# Patient Record
Sex: Female | Born: 1935 | Race: White | Hispanic: No | Smoking: Never smoker
Health system: Southern US, Community
[De-identification: ages and names within clinical notes are randomized; demographics above are authoritative.]

## PROBLEM LIST (undated history)

## (undated) DIAGNOSIS — K219 Gastro-esophageal reflux disease without esophagitis: Secondary | ICD-10-CM

## (undated) DIAGNOSIS — M1990 Unspecified osteoarthritis, unspecified site: Secondary | ICD-10-CM

## (undated) DIAGNOSIS — I4892 Unspecified atrial flutter: Secondary | ICD-10-CM

## (undated) DIAGNOSIS — I2510 Atherosclerotic heart disease of native coronary artery without angina pectoris: Secondary | ICD-10-CM

## (undated) DIAGNOSIS — I739 Peripheral vascular disease, unspecified: Secondary | ICD-10-CM

## (undated) DIAGNOSIS — S82892A Other fracture of left lower leg, initial encounter for closed fracture: Secondary | ICD-10-CM

## (undated) DIAGNOSIS — I499 Cardiac arrhythmia, unspecified: Secondary | ICD-10-CM

## (undated) DIAGNOSIS — I10 Essential (primary) hypertension: Secondary | ICD-10-CM

## (undated) DIAGNOSIS — C801 Malignant (primary) neoplasm, unspecified: Secondary | ICD-10-CM

## (undated) HISTORY — DX: Peripheral vascular disease, unspecified: I73.9

## (undated) HISTORY — PX: OTHER SURGICAL HISTORY: SHX169

## (undated) HISTORY — PX: PR VEIN BYPASS GRAFT,AORTO-FEM-POP: 35551

## (undated) HISTORY — PX: ABDOMINAL HYSTERECTOMY: SHX81

## (undated) HISTORY — DX: Essential (primary) hypertension: I10

## (undated) HISTORY — DX: Unspecified atrial flutter: I48.92

## (undated) HISTORY — DX: Atherosclerotic heart disease of native coronary artery without angina pectoris: I25.10

---

## 1998-03-06 ENCOUNTER — Inpatient Hospital Stay (HOSPITAL_COMMUNITY): Admission: EM | Admit: 1998-03-06 | Discharge: 1998-03-07 | Payer: Self-pay | Admitting: Emergency Medicine

## 1998-03-06 ENCOUNTER — Encounter: Payer: Self-pay | Admitting: Emergency Medicine

## 1998-12-01 ENCOUNTER — Ambulatory Visit (HOSPITAL_COMMUNITY): Admission: RE | Admit: 1998-12-01 | Discharge: 1998-12-01 | Payer: Self-pay | Admitting: Gastroenterology

## 1999-10-24 ENCOUNTER — Encounter: Admission: RE | Admit: 1999-10-24 | Discharge: 1999-10-24 | Payer: Self-pay | Admitting: Internal Medicine

## 1999-10-24 ENCOUNTER — Encounter: Payer: Self-pay | Admitting: Internal Medicine

## 2000-05-13 ENCOUNTER — Encounter: Payer: Self-pay | Admitting: Emergency Medicine

## 2000-05-13 ENCOUNTER — Emergency Department (HOSPITAL_COMMUNITY): Admission: EM | Admit: 2000-05-13 | Discharge: 2000-05-13 | Payer: Self-pay | Admitting: Emergency Medicine

## 2000-10-01 ENCOUNTER — Ambulatory Visit (HOSPITAL_BASED_OUTPATIENT_CLINIC_OR_DEPARTMENT_OTHER): Admission: RE | Admit: 2000-10-01 | Discharge: 2000-10-01 | Payer: Self-pay

## 2000-10-01 ENCOUNTER — Encounter (INDEPENDENT_AMBULATORY_CARE_PROVIDER_SITE_OTHER): Payer: Self-pay | Admitting: Specialist

## 2000-10-25 ENCOUNTER — Encounter: Payer: Self-pay | Admitting: Internal Medicine

## 2000-10-25 ENCOUNTER — Encounter: Admission: RE | Admit: 2000-10-25 | Discharge: 2000-10-25 | Payer: Self-pay | Admitting: Internal Medicine

## 2001-10-27 ENCOUNTER — Encounter: Payer: Self-pay | Admitting: Internal Medicine

## 2001-10-27 ENCOUNTER — Encounter: Admission: RE | Admit: 2001-10-27 | Discharge: 2001-10-27 | Payer: Self-pay | Admitting: Internal Medicine

## 2002-01-15 ENCOUNTER — Ambulatory Visit (HOSPITAL_COMMUNITY): Admission: RE | Admit: 2002-01-15 | Discharge: 2002-01-15 | Payer: Self-pay | Admitting: Gastroenterology

## 2002-10-30 ENCOUNTER — Encounter: Admission: RE | Admit: 2002-10-30 | Discharge: 2002-10-30 | Payer: Self-pay | Admitting: Internal Medicine

## 2002-10-30 ENCOUNTER — Encounter: Payer: Self-pay | Admitting: Internal Medicine

## 2002-11-09 ENCOUNTER — Other Ambulatory Visit: Admission: RE | Admit: 2002-11-09 | Discharge: 2002-11-09 | Payer: Self-pay | Admitting: Obstetrics and Gynecology

## 2003-11-01 ENCOUNTER — Encounter: Admission: RE | Admit: 2003-11-01 | Discharge: 2003-11-01 | Payer: Self-pay | Admitting: Internal Medicine

## 2004-07-20 ENCOUNTER — Encounter: Admission: RE | Admit: 2004-07-20 | Discharge: 2004-07-20 | Payer: Self-pay | Admitting: Internal Medicine

## 2004-08-31 ENCOUNTER — Encounter: Admission: RE | Admit: 2004-08-31 | Discharge: 2004-08-31 | Payer: Self-pay | Admitting: Internal Medicine

## 2004-09-05 ENCOUNTER — Ambulatory Visit: Payer: Self-pay | Admitting: Pulmonary Disease

## 2004-09-12 ENCOUNTER — Encounter: Admission: RE | Admit: 2004-09-12 | Discharge: 2004-09-12 | Payer: Self-pay | Admitting: Otolaryngology

## 2004-09-15 ENCOUNTER — Encounter: Admission: RE | Admit: 2004-09-15 | Discharge: 2004-09-15 | Payer: Self-pay | Admitting: Otolaryngology

## 2004-11-21 ENCOUNTER — Ambulatory Visit (HOSPITAL_COMMUNITY): Admission: RE | Admit: 2004-11-21 | Discharge: 2004-11-21 | Payer: Self-pay | Admitting: Internal Medicine

## 2005-02-28 ENCOUNTER — Encounter: Admission: RE | Admit: 2005-02-28 | Discharge: 2005-02-28 | Payer: Self-pay | Admitting: Internal Medicine

## 2005-10-24 ENCOUNTER — Encounter: Payer: Self-pay | Admitting: Cardiology

## 2007-10-28 ENCOUNTER — Ambulatory Visit: Payer: Self-pay | Admitting: Vascular Surgery

## 2007-11-06 ENCOUNTER — Ambulatory Visit (HOSPITAL_COMMUNITY): Admission: RE | Admit: 2007-11-06 | Discharge: 2007-11-07 | Payer: Self-pay | Admitting: Surgery

## 2007-11-06 ENCOUNTER — Ambulatory Visit: Payer: Self-pay | Admitting: Surgery

## 2007-11-19 ENCOUNTER — Inpatient Hospital Stay (HOSPITAL_COMMUNITY): Admission: RE | Admit: 2007-11-19 | Discharge: 2007-11-20 | Payer: Self-pay | Admitting: Vascular Surgery

## 2007-11-19 ENCOUNTER — Encounter: Payer: Self-pay | Admitting: Vascular Surgery

## 2007-11-24 ENCOUNTER — Ambulatory Visit: Payer: Self-pay | Admitting: Vascular Surgery

## 2007-12-15 ENCOUNTER — Ambulatory Visit: Payer: Self-pay | Admitting: Vascular Surgery

## 2008-01-26 ENCOUNTER — Ambulatory Visit: Payer: Self-pay | Admitting: Vascular Surgery

## 2008-05-04 ENCOUNTER — Ambulatory Visit: Payer: Self-pay | Admitting: Vascular Surgery

## 2008-11-23 ENCOUNTER — Ambulatory Visit: Payer: Self-pay | Admitting: Vascular Surgery

## 2009-06-21 ENCOUNTER — Ambulatory Visit: Payer: Self-pay | Admitting: Vascular Surgery

## 2009-11-29 ENCOUNTER — Ambulatory Visit: Payer: Self-pay | Admitting: Internal Medicine

## 2009-11-29 ENCOUNTER — Inpatient Hospital Stay (HOSPITAL_COMMUNITY): Admission: EM | Admit: 2009-11-29 | Discharge: 2009-12-06 | Payer: Self-pay | Admitting: Emergency Medicine

## 2009-11-29 ENCOUNTER — Ambulatory Visit: Payer: Self-pay | Admitting: Vascular Surgery

## 2009-11-30 HISTORY — PX: CAROTID STENT: SHX1301

## 2009-12-02 ENCOUNTER — Ambulatory Visit: Payer: Self-pay | Admitting: Hematology & Oncology

## 2009-12-09 ENCOUNTER — Observation Stay (HOSPITAL_COMMUNITY): Admission: EM | Admit: 2009-12-09 | Discharge: 2009-12-10 | Payer: Self-pay | Admitting: Emergency Medicine

## 2009-12-14 ENCOUNTER — Encounter: Admission: RE | Admit: 2009-12-14 | Discharge: 2009-12-14 | Payer: Self-pay | Admitting: Internal Medicine

## 2009-12-20 ENCOUNTER — Ambulatory Visit: Payer: Self-pay | Admitting: Vascular Surgery

## 2010-01-30 ENCOUNTER — Ambulatory Visit: Payer: Self-pay | Admitting: Cardiology

## 2010-02-01 ENCOUNTER — Encounter: Admission: RE | Admit: 2010-02-01 | Discharge: 2010-02-01 | Payer: Self-pay | Admitting: Cardiology

## 2010-03-28 ENCOUNTER — Ambulatory Visit: Payer: Self-pay | Admitting: Cardiology

## 2010-04-04 ENCOUNTER — Ambulatory Visit: Payer: Self-pay | Admitting: Cardiology

## 2010-04-10 ENCOUNTER — Ambulatory Visit: Payer: Self-pay | Admitting: Cardiology

## 2010-04-12 ENCOUNTER — Encounter: Admission: RE | Admit: 2010-04-12 | Discharge: 2010-04-12 | Payer: Self-pay | Admitting: Cardiology

## 2010-04-25 ENCOUNTER — Ambulatory Visit: Payer: Self-pay | Admitting: Vascular Surgery

## 2010-06-25 ENCOUNTER — Inpatient Hospital Stay (HOSPITAL_COMMUNITY)
Admission: EM | Admit: 2010-06-25 | Discharge: 2010-06-28 | Payer: Self-pay | Source: Home / Self Care | Attending: Cardiology | Admitting: Cardiology

## 2010-06-27 HISTORY — PX: GLIAYTE CATHERTER INSERTION: SHX5372

## 2010-07-05 ENCOUNTER — Encounter (INDEPENDENT_AMBULATORY_CARE_PROVIDER_SITE_OTHER): Payer: Self-pay | Admitting: *Deleted

## 2010-07-16 ENCOUNTER — Encounter: Payer: Self-pay | Admitting: Internal Medicine

## 2010-07-17 ENCOUNTER — Telehealth: Payer: Self-pay | Admitting: Internal Medicine

## 2010-07-21 ENCOUNTER — Telehealth: Payer: Self-pay | Admitting: Internal Medicine

## 2010-07-27 NOTE — Progress Notes (Signed)
Summary: pt having some chest discomfort  Phone Note Call from Patient Call back at Home Phone [phone]   Caller: Patient Reason for Call: Talk to Nurse, Talk to Doctor Summary of Call: pt has been put on diltiazem 180mg  once daily by Dr. Chilton Si and she is having a little chest discomfort and she wants to discuss this with someone and make sure it is ok Initial call taken by: Omer Jack,  July 17, 2010 3:49 PM  Follow-up for Phone Call        Since discharge, patient saw her PCP, and he started her on Diltiazem 180mg  daily for her BP. Her BP is still remaining elevated around 160 systolic. She doesn't remember the diastolic. It varies with her BP cuff. Sometimes the systolic is 130-140s.  Since her PCP is following this, advised her to contact them with her BP readings. 01/19 140/70, 01/20142/70, 1/21 144/70, 170/50 01/22, 160/48 01/23. She has not been taking her HR.  No c/o palpitations.  She has been having chest discomfort with ambulation. She has had CP on & off mainly when she takes a deep breath. This started happening 2-3 days after discharge from the hospital. Advised her to try her Nitroglycerine SL tablets. Dr. Deborah Chalk is her primary cardiologist so I contactd their office and spoke with Tresa Endo, his RN. She will call Mrs. Brossart to bring her in their office. Whitney Maeola Sarah RN  July 17, 2010 4:24 PM  Follow-up by: Whitney Maeola Sarah RN,  July 17, 2010 4:24 PM

## 2010-07-27 NOTE — Letter (Signed)
Summary: Appointment - Reminder 2  [facility], Main Office  [address]   [place], [place] [address]   Phone: [phone]  Fax: [phone]     July 05, 2010 MRN: [id number]   [facility] [address] [place], [place]  [address]   Dear Ms. Fehringer,  Our records indicate that it is time to schedule a follow-up appointment. Dr.Taylor recommended that you follow up with [place] in 6-8 weeks from hospital discharge           . It is very important that we reach you to schedule this appointment. We look forward to participating in your health care needs. Please contact us at the number listed above at your earliest convenience to schedule your appointment.  If you are unable to make an appointment at this time, give [place] a call so we can update our records.     Sincerely,   Glass blower/designer

## 2010-08-02 NOTE — Progress Notes (Signed)
Summary: pt needs to know about antibiotics  Phone Note Call from Patient Call back at Home Phone [phone]   Caller: Patient Reason for Call: Talk to Nurse, Talk to Doctor Summary of Call: pt needs to know if she needs pre meds prior to dental appt next week Initial call taken by: Omer Jack,  July 21, 2010 11:46 AM  Follow-up for Phone Call        Pt needs to have Amoxicillin 2.0g one hour prior to procedure per Dr [name].  he gives antibiotics 3 month s/p ablation lmom for pt to call Mon and let me know where to call in medication Dennis Bast, RN, BSN  July 21, 2010 5:51 PM Called pt back and spoke with her she can not take PCN  I have updated her allergies  She is taking Clindamycin 150mg  three times a day #30 states this is giving her diaherra Dennis Bast, RN, BSN  July 24, 2010 5:00 PM   New Allergies: ! PCN New Allergies: ! PCN

## 2010-08-03 NOTE — Consult Note (Signed)
NAMEJENNAYA, Michelle Gaines             ACCOUNT NO.:  [id number]  MEDICAL RECORD NO.:  [id number]          PATIENT TYPE:  INP  LOCATION:  2039                         FACILITY:  [facility]  PHYSICIAN:  Doylene Canning. [name], MD    DATE OF BIRTH:  12-21-1928  DATE OF CONSULTATION:  06/27/2010 DATE OF DISCHARGE:                                CONSULTATION   REQUESTING PHYSICIAN:  Jonelle Sidle, MD  INDICATION FOR CONSULTATION:  Evaluation of atrial flutter.  HISTORY OF PRESENT ILLNESS:  The patient is a very pleasant 77 year old woman with coronary artery disease and peripheral artery disease status post stenting of the right coronary artery in 2011, (June) by Dr. Deborah Chalk and status post peripheral bypass grafting of the right femoral artery by Dr. Hart Rochester in the past.  The patient was in her usual state of health when she presented with palpitations, chest pain, and shortness of breath and was subsequently found to be in atrial flutter with a rapid ventricular response.  She is admitted to the hospital for additional evaluation.  The patient denies palpitations.  She notes that over the last few days, she has had increasing shortness of breath and chest discomfort.  In the emergency room, she was found to be in atrial flutter with a rapid ventricular response at around 120 beats per minute and is admitted for additional evaluation.  PAST MEDICAL HISTORY:  As noted in the HPI.  She also has longstanding hypertension.  There may be a bit of dementia, though it is not clearly documented.  SOCIAL HISTORY:  The patient lives in [place] with her husband.  She is retired.  She denies tobacco abuse.  FAMILY HISTORY:  Negative for premature coronary artery disease.  REVIEW OF SYSTEMS:  As noted in the HPI.  She has very minimal arthritic complaints.  She has very minimal claudication.  PHYSICAL EXAMINATION:  GENERAL:  She is a pleasant, elderly-appearing woman in no acute distress.  She is  very thin. VITAL SIGNS:  Blood pressure was 140/90, the pulse was 90 and irregular, respirations were 18, temperature was 98. HEENT:  Normocephalic and atraumatic.  Pupils equal and round. Oropharynx is moist.  Sclerae are anicteric. NECK:  No jugular venous distention.  There is no thyromegaly.  Trachea is midline.  Carotids are 2+ and symmetric. LUNGS:  Clear bilaterally to auscultation.  No wheezes, rales, or rhonchi are present.  There is no increased work of breathing. CARDIOVASCULAR:  An irregular rhythm with normal S1 and S2.  The PMI was not enlarged nor laterally displaced. ABDOMINAL:  Soft, nontender, nondistended.  There is no organomegaly. The bowel sounds are present.  No rebound or guarding. EXTREMITIES:  A well-healed right inguinal scar with an obvious graft in place.  The pulses were 2+ and symmetric.  There was no peripheral edema. NEUROLOGIC:  Alert and oriented x3 with the cranial nerves intact. Strength is 5/5, symmetric.  EKG demonstrates atrial flutter with a variable ventricular response.  LABORATORY DATA:  Unremarkable.  TEE was carried out today, demonstrating small chambers and no left atrial appendage thrombus.  IMPRESSION: 1. Ischemic heart disease status post stenting  of the right coronary     artery on aspirin and Plavix. 2. Hypertension. 3. New-onset atrial flutter.  DISCUSSION:  I have discussed the treatment options with the patient and her husband.  The patient is not a good long-term anticoagulation candidate with the Coumadin, in fact, she refuses medicine.  I would recommend proceeding with EP study and catheter ablation of her flutter as the TEE demonstrated no left atrial appendage thrombus.  The risks, benefits, goals, and expectations of the procedure have been discussed and she wishes to proceed.  This will be scheduled at the earliest possible convenient time.     Doylene Canning. [name], MD     GWT/MEDQ  D:  06/27/2010  T:  06/28/2010   Job:  [id number]  cc:   Colleen Can. Deborah Chalk, M.D.  Electronically Signed by Lewayne Bunting MD on 08/03/2010 05:08:27 PM

## 2010-08-03 NOTE — Discharge Summary (Signed)
NAMEFARIHA, Michelle Gaines             ACCOUNT NO.:  [id number]  MEDICAL RECORD NO.:  [id number]          PATIENT TYPE:  INP  LOCATION:  2039                         FACILITY:  [facility]  PHYSICIAN:  Doylene Canning. [name], MD    DATE OF BIRTH:  12-May-1929  DATE OF ADMISSION:  06/25/2010 DATE OF DISCHARGE:  06/28/2010                              DISCHARGE SUMMARY   PRIMARY CARE PHYSICIAN:  Lenon Curt. Chilton Si, MD  PRIMARY CARDIOLOGIST:  Colleen Can. Deborah Chalk, MD  ELECTROPHYSIOLOGIST:  Doylene Canning. [name], MD  PRIMARY DIAGNOSIS:  Atrial flutter with rapid ventricular response.  SECONDARY DIAGNOSES: 1. Coronary artery disease status post myocardial infarction with     stenting of the right coronary artery with a drug-eluting stent in     June of this year. 2. Peripheral vascular disease with previous right external iliac to     superficial femoral artery bypass grafting by Dr. Hart Rochester in 2009. 3. Hypertension. 4. History of ovarian cancer. 5. History of hysterectomy.  ALLERGIES:  The patient is allergic to CODEINE, PENICILLIN, CEPHALOSPORIN, DOXYCYCLINE, CLORAZEPATE, ZOLPIDEM, FEXOFENADINE, HYDRALAZINE, NORVASC, and COMTREX.  PROCEDURES THIS ADMISSION: 1. Chest x-ray on June 25, 2010, demonstrated no acute disease. 2. CT angiogram of the chest on June 25, 2010, demonstrating no     pulmonary embolism. 3. TEE on June 27, 2010, by Dr. Eden Emms, which demonstrated an     ejection fraction of 50% with septal hypokinesis, mild mitral     regurgitation.  The patient had no left atrial appendage thrombus     or spontaneous contrast. 4. Electrophysiology study and radiofrequency catheter ablation of     atrial flutter on June 27, 2010, by Dr. [name].  This study     demonstrated successful ablation of typical atrial flutter across     the usual atrial flutter isthmus resulting in termination of     flutter, restoration of sinus rhythm, and creation of bidirectional     block.  The patient had no  early apparent complications.  BRIEF HISTORY OF PRESENT ILLNESS:  Ms. Emmerich is a 77 year old female with a history of coronary artery disease, peripheral vascular disease, hypertension.  On the day before admission, she had a sudden onset of pleuritic chest pain and palpitations.  She has had these symptoms on and off since November 29, 2009, although they have never persisted like the episode preceding admission.  Because of her symptoms, she presented to [facility] for evaluation.  HOSPITAL COURSE:  The patient presented to [facility] for evaluation of palpitations and chest pain.  She was found to be in atrial flutter with rapid ventricular response.  She was began on heparin and diltiazem drip for rate control.  She was evaluated by Dr. [name] with Electrophysiology for treatment options of atrial flutter. Dr. [name] recommended TEE and catheter ablation of atrial flutter. Risks, benefits, and alternatives to this procedure were discussed with the patient.  She wished to proceed.  The patient underwent TEE and radiofrequency catheter ablation of atrial flutter on June 27, 2010. She was monitored on telemetry overnight, which demonstrated sinus rhythm.  She was examined by Dr.  [name] on June 28, 2010.  Her groin incision was without hematoma.  Telemetry demonstrated sinus rhythm with no ectopy.  EKG demonstrated sinus rhythm with a ventricular rate of 73 beats per minute and intervals of 0.17/0.08/0.44.  The patient was considered stable for discharge.  Dr. [name] did discuss anticoagulation with the patient.  However, she refuses Coumadin at this time.  FOLLOWUP APPOINTMENTS: 1. Dr. [name] in 6 weeks - our office will call to schedule this     appointment. 2. Dr. Deborah Chalk as scheduled. 3. Dr. Chilton Si as scheduled.  DISCHARGE INSTRUCTIONS: 1. Increase activity slowly. 2. No driving for 2 days. 3. Follow low-sodium heart-healthy diet. 4. Keep incisions clean  and dry.  DISCHARGE MEDICATIONS: 1. Tylenol 325 mg every 4 hours as needed. 2. Aspirin 325 mg daily. 3. Coreg 6.25 mg 1 tablet twice daily. 4. Nitroglycerin as needed for chest pain. 5. Plavix 75 mg daily. 6. Lorazepam 1 mg 1/2 to 1 tablet daily at bedtime as needed. 7. MiraLax daily as needed. 8. Multivitamin daily. 9. Valturna 300/320 mg 1 tablet twice daily.  Of note, the patient's metoprolol and aspirin 81 mg were changed this admission to a full-strength aspirin and carvedilol per Dr [name].  DISPOSITION:  The patient was seen and examined by Dr. [name] on June 28, 2009, considered stable for discharge.  DURATION OF DISCHARGE ENCOUNTER:  35 minutes.     Gypsy Balsam, RN,BSN   ______________________________ Doylene Canning. [name], MD    AS/MEDQ  D:  06/28/2010  T:  06/28/2010  Job:  [id number]  cc:   Lenon Curt. Chilton Si, M.D. Colleen Can. Deborah Chalk, M.D.  Electronically Signed by Gypsy Balsam RNBSN on 07/02/2010 05:54:20 PM Electronically Signed by Lewayne Bunting MD on 08/03/2010 05:08:32 PM

## 2010-08-03 NOTE — Op Note (Signed)
NAMEKAILANI, BRASS             ACCOUNT NO.:  [id number]  MEDICAL RECORD NO.:  [id number]          PATIENT TYPE:  INP  LOCATION:  2039                         FACILITY:  [facility]  PHYSICIAN:  Doylene Canning. [name], MD    DATE OF BIRTH:  04/26/1929  DATE OF PROCEDURE:  06/27/2010 DATE OF DISCHARGE:                              OPERATIVE REPORT   PROCEDURE PERFORMED:  Electrophysiologic study and RF catheter ablation of atrial flutter.  INTRODUCTION:  The patient is a very pleasant 77 year old patient of Dr. Deborah Chalk.  She has ischemic heart disease status post stenting.  She has peripheral vascular disease and she presented to the hospital with atrial flutter and rapid ventricular response.  She is now referred for electrophysiologic study and catheter ablation after transesophageal echo demonstrated no left atrial appendage or thrombus.  PROCEDURE:  After informed consent was obtained, the patient was taken to the diagnostic EP lab in a fasting state.  After usual preparation and draping, intravenous fentanyl and midazolam was given for sedation. A 6-French hexapolar catheter was inserted percutaneously into the right jugular vein and advanced to the coronary sinus.  A 5-French quadripolar catheter was inserted percutaneously in the right femoral vein and advanced to the His bundle region.  A 7-French quadripolar ablation catheter was inserted percutaneously and the right femoral vein advanced the right atrium.  Mapping was carried out demonstrating typical counterclockwise tricuspid annular reentrant atrial flutter at a cycle length of 270 milliseconds.  This was confirmed by entrainment mapping, which demonstrated a post pacing interval of less than 30 milliseconds of the tachycardia cycle length.  With all the above diagnosis of typical atrial flutter was made and the ablation catheter was advanced into the region of the tricuspid valve annulus.  A total of 8 RF energy applications  were subsequently delivered.  During atrial flutter, the tachycardia was terminated and sinus rhythm was restored.  Pacing from the coronary sinus demonstrated a stimulation to atrial signal of greater than 150 milliseconds off the ablation catheter.  The patient was observed for approximately 15 minutes and during this time, rapid ventricular pacing was carried out from the right ventricle, this demonstrated VA dissociation at 600 milliseconds.  Programmed ventricular stimulation was carried out demonstrating VA dissociation at 600 milliseconds.  Rapid atrial pacing was carried out demonstrating AV Wenckebach cycle length of 430 milliseconds.  Programmed atrial stimulation was carried out from the coronary sinus in the right atrium demonstrated at a base drive cycle length of 130 milliseconds and the S1 and S2 interval stepwise decreased down to 340 milliseconds where AV node ERP was observed.  During programmed atrial stimulation, there were no inducible arrhythmias.  At this point, the catheters were removed, hemostasis was assured, and the patient was returned to her room in satisfactory condition.  COMPLICATIONS:  There were no immediate procedural complications.  RESULTS:  a.  Baseline ECG.  Baseline ECG demonstrates atrial flutter with rapid ventricular response. b.  Baseline intervals.  The HV interval was 58 milliseconds.  The QRS duration 95 milliseconds.  Sinus node cycle length following ablation was 1200 milliseconds. c.  Rapid ventricular pacing.  Following ablation, rapid ventricular pacing demonstrated VA dissociation at 600 milliseconds. d.  Programmed ventricular stimulation.  Following ablation, programmed ventricular stimulation was demonstrated VA dissociation at 600 milliseconds. e.  Rapid atrial pacing.  Following ablation, rapid atrial pacing demonstrated an AV Wenckebach cycle length of 430 milliseconds.  During rapid atrial pacing, the PR interval was less  than the RR interval and there was no inducible SVT. f.  Programmed atrial stimulation.  Programmed atrial stimulation following ablation was carried out at a base drive cycle length of 960 milliseconds.  The S1 and S2 interval was stepwise decreased down to 340 milliseconds where the AV node ERP was observed.  During programmed atrial stimulation, there were no AH jumps, no echo beats, no inducible SVT. g.  Arrhythmias observed. 1. Atrial flutter initiation present at the time of EP study.     Duration was sustained.  Termination was with catheter ablation.     Cycle length was 270 milliseconds. 2. Atrial fibrillation initiation spontaneous.  Following termination     of flutter, the duration was sustained, termination was     spontaneous.     a.     Mapping.  Mapping of the atrial flutter isthmus demonstrated      a somewhat smaller than usual caudally displaced atrial flutter      isthmus.     b.     RF energy application.  A total of 8 RF energy applications      were delivered to the usual atrial flutter isthmus resulting in      termination of flutter, restoration of sinus rhythm, creation of      bidirectional block in atrial flutter isthmus.  CONCLUSION:  Study demonstrates successful with electrophysiologic and RF catheter ablation of typical atrial flutter with a total of 8 RF energy applications delivered, the usual atrial flutter isthmus resulting in termination of flutter, restoration of sinus rhythm, creation of bidirectional block in atrial flutter isthmus.     Doylene Canning. [name], MD     GWT/MEDQ  D:  06/27/2010  T:  06/28/2010  Job:  [id number]  cc:   Colleen Can. Deborah Chalk, M.D.  Electronically Signed by Lewayne Bunting MD on 08/03/2010 11:91:47 PM

## 2010-08-16 DIAGNOSIS — I2510 Atherosclerotic heart disease of native coronary artery without angina pectoris: Secondary | ICD-10-CM | POA: Insufficient documentation

## 2010-08-16 DIAGNOSIS — I10 Essential (primary) hypertension: Secondary | ICD-10-CM | POA: Insufficient documentation

## 2010-08-16 DIAGNOSIS — I739 Peripheral vascular disease, unspecified: Secondary | ICD-10-CM | POA: Insufficient documentation

## 2010-08-17 ENCOUNTER — Encounter (INDEPENDENT_AMBULATORY_CARE_PROVIDER_SITE_OTHER): Payer: MEDICARE | Admitting: Internal Medicine

## 2010-08-17 ENCOUNTER — Encounter: Payer: Self-pay | Admitting: Internal Medicine

## 2010-08-17 DIAGNOSIS — I4892 Unspecified atrial flutter: Secondary | ICD-10-CM | POA: Insufficient documentation

## 2010-08-17 DIAGNOSIS — I253 Aneurysm of heart: Secondary | ICD-10-CM

## 2010-08-22 NOTE — Assessment & Plan Note (Signed)
Summary: eph/per pt call.mj   Visit Type:  Initial Consult   History of Present Illness: Michelle Gaines returns today for followup. She is a pleasant 77 yo woman with  CAD, HTN, atrial flutter s/p EPS/RFA of atrial flutter several weeks ago. She has rare palpitations but denies c/p. The patient is convinced that she is losing her vision when she takes her ASA.  She is now only taking plavix having stopped the ASA several months ago.  The patient is convinced that her multiple herbal preparations are helping her. No syncope or near syncope.  Current Medications (verified): 1)  Aspirin Ec 325 Mg Tbec (Aspirin) .... Take One Tablet By Mouth Daily 2)  Carvedilol 6.25 Mg Tabs (Carvedilol) .... Take One Tablet By Mouth Twice A Day 3)  Nitrostat 0.4 Mg Subl (Nitroglycerin) .Marland Kitchen.. 1 Tablet Under Tongue At Onset of Chest Pain; You May Repeat Every 5 Minutes For Up To 3 Doses. 4)  Plavix 75 Mg Tabs (Clopidogrel Bisulfate) .... Take One Tablet By Mouth Daily 5)  Lorazepam 1 Mg Tabs (Lorazepam) .... Uad 6)  Tylenol 325 Mg Tabs (Acetaminophen) .... As Needed 7)  Miralax  Powd (Polyethylene Glycol 3350) .... As Needed 8)  Valturna 300-320 Mg Tabs (Aliskiren-Valsartan) .... Two Times A Day 9)  Multivitamins   Tabs (Multiple Vitamin) .... Once Daily  Allergies: 1)  ! Pcn 2)  ! Codeine 3)  ! Cephalexin 4)  ! Doxycycline 5)  ! * Clorazepate 6)  ! * Zolpidem 7)  ! * Fexofenodine 8)  ! * Hydralazine 9)  ! * Phenylephrine 10)  ! * Amlodipine  Past History:  Past Medical History: Last updated: 08/16/2010 Current Problems:  HYPERTENSION (ICD-401.9) PVD (ICD-443.9) CAD (ICD-414.00)    Family History: No premature CAD  Social History: Married times 60 years.  Denies tobacco or ETOH.  Vital Signs:  Patient profile:   76 year old female Height:      64 inches Weight:      91 pounds BMI:     15.68 Pulse rate:   51 / minute BP sitting:   170 / 82  (left arm)  Vitals Entered By: Laurance Flatten  CMA (August 17, 2010 8:54 AM)  Physical Exam  General:  Elderly, well developed, well nourished, in no acute distress.  HEENT: normal Neck: supple. No JVD. Carotids 2+ bilaterally no bruits Cor: RRR no rubs, gallops or murmur Lungs: CTA Ab: soft, nontender. nondistended. No HSM. Good bowel sounds Ext: warm. no cyanosis, clubbing or edema Neuro: alert and oriented. Grossly nonfocal. affect pleasant    EKG  Procedure date:  08/17/2010  Findings:      Sinus bradycardia with rate of:51.   Left ventricular hypertrophy.    Impression & Recommendations:  Problem # 1:  CAD (ICD-414.00) She denies anginal symptoms. Continue current meds. Her updated medication list for this problem includes:        Carvedilol 6.25 Mg Tabs (Carvedilol) .Marland Kitchen... Take one tablet by mouth twice a day    Nitrostat 0.4 Mg Subl (Nitroglycerin) .Marland Kitchen... 1 tablet under tongue at onset of chest pain; you may repeat every 5 minutes for up to 3 doses.    Plavix 75 Mg Tabs (Clopidogrel bisulfate) .Marland Kitchen... Take one tablet by mouth daily  Problem # 2:  ATRIAL FLUTTER (ICD-427.32) Her symptoms have resolved after catheter ablation. Willl follow. She is not on ASA.  Patient Instructions: 1)  Your physician wants you to follow-up in: 6 months with Dr [name]  You will receive a reminder letter in the mail two months in advance. If you don't receive a letter, please call our office to schedule the follow-up appointment. 2)  Your physician recommends that you continue on your current medications as directed. Please refer to the Current Medication list given to you today. 3)  If Aspirin is affecting your vision please stop

## 2010-09-04 LAB — CBC
HCT: 38.7 % (ref 36.0–46.0)
HCT: 43 % (ref 36.0–46.0)
Hemoglobin: 12 g/dL (ref 12.0–15.0)
Hemoglobin: 12.8 g/dL (ref 12.0–15.0)
MCH: 32.2 pg (ref 26.0–34.0)
MCHC: 33.1 g/dL (ref 30.0–36.0)
MCHC: 33.5 g/dL (ref 30.0–36.0)
MCV: 97.2 fL (ref 78.0–100.0)
MCV: 97.5 fL (ref 78.0–100.0)
MCV: 97.9 fL (ref 78.0–100.0)
Platelets: 135 10*3/uL — ABNORMAL LOW (ref 150–400)
Platelets: 153 10*3/uL (ref 150–400)
Platelets: 182 10*3/uL (ref 150–400)
RBC: 3.79 MIL/uL — ABNORMAL LOW (ref 3.87–5.11)
RBC: 4.39 MIL/uL (ref 3.87–5.11)
RDW: 13 % (ref 11.5–15.5)
WBC: 10.4 10*3/uL (ref 4.0–10.5)
WBC: 5.9 10*3/uL (ref 4.0–10.5)
WBC: 6.8 10*3/uL (ref 4.0–10.5)

## 2010-09-04 LAB — LIPID PANEL
Cholesterol: 197 mg/dL (ref 0–200)
LDL Cholesterol: 108 mg/dL — ABNORMAL HIGH (ref 0–99)
Total CHOL/HDL Ratio: 2.4 RATIO

## 2010-09-04 LAB — CARDIAC PANEL(CRET KIN+CKTOT+MB+TROPI)
CK, MB: 0.9 ng/mL (ref 0.3–4.0)
CK, MB: 1.8 ng/mL (ref 0.3–4.0)
Relative Index: INVALID (ref 0.0–2.5)
Total CK: 41 U/L (ref 7–177)

## 2010-09-04 LAB — DIFFERENTIAL
Basophils Absolute: 0 10*3/uL (ref 0.0–0.1)
Basophils Relative: 0 % (ref 0–1)
Eosinophils Absolute: 0 10*3/uL (ref 0.0–0.7)
Eosinophils Relative: 0 % (ref 0–5)
Lymphocytes Relative: 8 % — ABNORMAL LOW (ref 12–46)

## 2010-09-04 LAB — POCT CARDIAC MARKERS
Myoglobin, poc: 104 ng/mL (ref 12–200)
Myoglobin, poc: 55.3 ng/mL (ref 12–200)
Myoglobin, poc: 82.4 ng/mL (ref 12–200)
Troponin i, poc: <0.05 ng/mL (ref 0.00–0.09)

## 2010-09-04 LAB — BASIC METABOLIC PANEL
BUN: 16 mg/dL (ref 6–23)
Chloride: 103 mEq/L (ref 96–112)
Chloride: 105 mEq/L (ref 96–112)
Creatinine, Ser: 0.86 mg/dL (ref 0.4–1.2)
GFR calc Af Amer: >60 mL/min (ref >60)
GFR calc non Af Amer: 60 mL/min — ABNORMAL LOW (ref >60)
Potassium: 3.9 mEq/L (ref 3.5–5.1)

## 2010-09-04 LAB — PROTIME-INR
INR: 1.11 (ref 0.00–1.49)
Prothrombin Time: 14.5 seconds (ref 11.6–15.2)

## 2010-09-04 LAB — CK TOTAL AND CKMB (NOT AT ARMC)
Relative Index: INVALID (ref 0.0–2.5)
Total CK: 46 U/L (ref 7–177)
Total CK: 60 U/L (ref 7–177)

## 2010-09-04 LAB — HEPARIN LEVEL (UNFRACTIONATED)
Heparin Unfractionated: 0.12 IU/mL — ABNORMAL LOW (ref 0.30–0.70)
Heparin Unfractionated: <0.1 IU/mL — ABNORMAL LOW (ref 0.30–0.70)

## 2010-09-04 LAB — HEMOGLOBIN A1C: Hgb A1c MFr Bld: 5.5 % (ref <5.7)

## 2010-09-10 LAB — HEMOCCULT GUIAC POC 1CARD (OFFICE): Fecal Occult Bld: NEGATIVE

## 2010-09-10 LAB — URINALYSIS, ROUTINE W REFLEX MICROSCOPIC
Bilirubin Urine: NEGATIVE
Glucose, UA: NEGATIVE mg/dL
Ketones, ur: NEGATIVE mg/dL
Leukocytes, UA: NEGATIVE
Nitrite: NEGATIVE
Protein, ur: NEGATIVE mg/dL
Specific Gravity, Urine: 1.014 (ref 1.005–1.030)
Urobilinogen, UA: 0.2 mg/dL (ref 0.0–1.0)
pH: 6 (ref 5.0–8.0)

## 2010-09-10 LAB — BASIC METABOLIC PANEL
BUN: 13 mg/dL (ref 6–23)
BUN: 18 mg/dL (ref 6–23)
CO2: 24 mEq/L (ref 19–32)
CO2: 26 mEq/L (ref 19–32)
Calcium: 8.6 mg/dL (ref 8.4–10.5)
Chloride: 102 mEq/L (ref 96–112)
Chloride: 104 mEq/L (ref 96–112)
Creatinine, Ser: 0.79 mg/dL (ref 0.4–1.2)
GFR calc Af Amer: >60 mL/min (ref >60)
GFR calc non Af Amer: >60 mL/min (ref >60)
Glucose, Bld: 87 mg/dL (ref 70–99)
Glucose, Bld: 95 mg/dL (ref 70–99)
Potassium: 3.4 mEq/L — ABNORMAL LOW (ref 3.5–5.1)
Potassium: 3.6 mEq/L (ref 3.5–5.1)
Sodium: 134 mEq/L — ABNORMAL LOW (ref 135–145)

## 2010-09-10 LAB — CBC
HCT: 32.4 % — ABNORMAL LOW (ref 36.0–46.0)
HCT: 35.7 % — ABNORMAL LOW (ref 36.0–46.0)
MCHC: 34 g/dL (ref 30.0–36.0)
MCV: 89.4 fL (ref 78.0–100.0)
MCV: 89.8 fL (ref 78.0–100.0)
Platelets: 216 10*3/uL (ref 150–400)
Platelets: 242 10*3/uL (ref 150–400)
RBC: 3.61 MIL/uL — ABNORMAL LOW (ref 3.87–5.11)
RDW: 15.9 % — ABNORMAL HIGH (ref 11.5–15.5)
WBC: 8.8 10*3/uL (ref 4.0–10.5)

## 2010-09-10 LAB — COMPREHENSIVE METABOLIC PANEL
ALT: 204 U/L — ABNORMAL HIGH (ref 0–35)
AST: 48 U/L — ABNORMAL HIGH (ref 0–37)
Albumin: 3 g/dL — ABNORMAL LOW (ref 3.5–5.2)
Alkaline Phosphatase: 75 U/L (ref 39–117)
BUN: 21 mg/dL (ref 6–23)
CO2: 24 mEq/L (ref 19–32)
Calcium: 8.6 mg/dL (ref 8.4–10.5)
Chloride: 101 mEq/L (ref 96–112)
Creatinine, Ser: 0.96 mg/dL (ref 0.4–1.2)
GFR calc Af Amer: >60 mL/min (ref >60)
GFR calc non Af Amer: 56 mL/min — ABNORMAL LOW (ref >60)
Glucose, Bld: 113 mg/dL — ABNORMAL HIGH (ref 70–99)
Potassium: 3.7 mEq/L (ref 3.5–5.1)
Sodium: 132 mEq/L — ABNORMAL LOW (ref 135–145)
Total Bilirubin: 2.1 mg/dL — ABNORMAL HIGH (ref 0.3–1.2)
Total Protein: 5.8 g/dL — ABNORMAL LOW (ref 6.0–8.3)

## 2010-09-10 LAB — DIFFERENTIAL
Basophils Absolute: 0 10*3/uL (ref 0.0–0.1)
Basophils Relative: 0 % (ref 0–1)
Eosinophils Absolute: 0.1 10*3/uL (ref 0.0–0.7)
Eosinophils Relative: 1 % (ref 0–5)
Lymphocytes Relative: 6 % — ABNORMAL LOW (ref 12–46)
Lymphs Abs: 0.5 10*3/uL — ABNORMAL LOW (ref 0.7–4.0)
Monocytes Absolute: 0.9 10*3/uL (ref 0.1–1.0)
Monocytes Relative: 11 % (ref 3–12)
Neutro Abs: 7.3 10*3/uL (ref 1.7–7.7)
Neutrophils Relative %: 82 % — ABNORMAL HIGH (ref 43–77)

## 2010-09-10 LAB — MAGNESIUM: Magnesium: 2 mg/dL (ref 1.5–2.5)

## 2010-09-10 LAB — URINE MICROSCOPIC-ADD ON

## 2010-09-11 LAB — PROTIME-INR
INR: 1.03 (ref 0.00–1.49)
INR: 1.26 (ref 0.00–1.49)
INR: 1.79 — ABNORMAL HIGH (ref 0.00–1.49)
INR: 1.85 — ABNORMAL HIGH (ref 0.00–1.49)
INR: 2.15 — ABNORMAL HIGH (ref 0.00–1.49)
Prothrombin Time: 13.4 seconds (ref 11.6–15.2)
Prothrombin Time: 14.7 seconds (ref 11.6–15.2)
Prothrombin Time: 14.7 seconds (ref 11.6–15.2)
Prothrombin Time: 15.7 seconds — ABNORMAL HIGH (ref 11.6–15.2)
Prothrombin Time: 16.1 seconds — ABNORMAL HIGH (ref 11.6–15.2)
Prothrombin Time: 20.6 seconds — ABNORMAL HIGH (ref 11.6–15.2)

## 2010-09-11 LAB — DIFFERENTIAL
Basophils Absolute: 0 10*3/uL (ref 0.0–0.1)
Basophils Absolute: 0 10*3/uL (ref 0.0–0.1)
Basophils Relative: 0 % (ref 0–1)
Eosinophils Absolute: 0.1 10*3/uL (ref 0.0–0.7)
Eosinophils Absolute: 0.1 10*3/uL (ref 0.0–0.7)
Eosinophils Relative: 0 % (ref 0–5)
Eosinophils Relative: 1 % (ref 0–5)
Eosinophils Relative: 2 % (ref 0–5)
Eosinophils Relative: 3 % (ref 0–5)
Lymphocytes Relative: 10 % — ABNORMAL LOW (ref 12–46)
Lymphocytes Relative: 10 % — ABNORMAL LOW (ref 12–46)
Lymphocytes Relative: 4 % — ABNORMAL LOW (ref 12–46)
Lymphocytes Relative: 6 % — ABNORMAL LOW (ref 12–46)
Lymphocytes Relative: 9 % — ABNORMAL LOW (ref 12–46)
Lymphs Abs: 0.6 10*3/uL — ABNORMAL LOW (ref 0.7–4.0)
Lymphs Abs: 0.7 10*3/uL (ref 0.7–4.0)
Lymphs Abs: 0.7 10*3/uL (ref 0.7–4.0)
Lymphs Abs: 0.7 10*3/uL (ref 0.7–4.0)
Monocytes Absolute: 0.4 10*3/uL (ref 0.1–1.0)
Monocytes Absolute: 0.5 10*3/uL (ref 0.1–1.0)
Monocytes Absolute: 0.7 10*3/uL (ref 0.1–1.0)
Monocytes Absolute: 1.3 10*3/uL — ABNORMAL HIGH (ref 0.1–1.0)
Monocytes Relative: 3 % (ref 3–12)
Monocytes Relative: 7 % (ref 3–12)
Monocytes Relative: 8 % (ref 3–12)
Monocytes Relative: 9 % (ref 3–12)
Neutro Abs: 15.8 10*3/uL — ABNORMAL HIGH (ref 1.7–7.7)
Neutro Abs: 5.7 10*3/uL (ref 1.7–7.7)
Neutrophils Relative %: 78 % — ABNORMAL HIGH (ref 43–77)
Neutrophils Relative %: 86 % — ABNORMAL HIGH (ref 43–77)

## 2010-09-11 LAB — CARDIAC PANEL(CRET KIN+CKTOT+MB+TROPI)
CK, MB: 1 ng/mL (ref 0.3–4.0)
Relative Index: INVALID (ref 0.0–2.5)
Relative Index: INVALID (ref 0.0–2.5)
Total CK: 284 U/L — ABNORMAL HIGH (ref 7–177)
Total CK: 48 U/L (ref 7–177)
Total CK: 58 U/L (ref 7–177)
Troponin I: 0.02 ng/mL (ref 0.00–0.06)
Troponin I: 0.02 ng/mL (ref 0.00–0.06)
Troponin I: 0.12 ng/mL — ABNORMAL HIGH (ref 0.00–0.06)

## 2010-09-11 LAB — CBC
HCT: 26.8 % — ABNORMAL LOW (ref 36.0–46.0)
HCT: 28 % — ABNORMAL LOW (ref 36.0–46.0)
HCT: 29.5 % — ABNORMAL LOW (ref 36.0–46.0)
HCT: 31.9 % — ABNORMAL LOW (ref 36.0–46.0)
HCT: 33.6 % — ABNORMAL LOW (ref 36.0–46.0)
HCT: 36.9 % (ref 36.0–46.0)
Hemoglobin: 10 g/dL — ABNORMAL LOW (ref 12.0–15.0)
Hemoglobin: 11.1 g/dL — ABNORMAL LOW (ref 12.0–15.0)
Hemoglobin: 12.6 g/dL (ref 12.0–15.0)
Hemoglobin: 4.9 g/dL — CL (ref 12.0–15.0)
Hemoglobin: 9.1 g/dL — ABNORMAL LOW (ref 12.0–15.0)
Hemoglobin: 9.2 g/dL — ABNORMAL LOW (ref 12.0–15.0)
Hemoglobin: 9.5 g/dL — ABNORMAL LOW (ref 12.0–15.0)
MCHC: 33.7 g/dL (ref 30.0–36.0)
MCHC: 33.8 g/dL (ref 30.0–36.0)
MCHC: 34.2 g/dL (ref 30.0–36.0)
MCHC: 34.3 g/dL (ref 30.0–36.0)
MCHC: 34.4 g/dL (ref 30.0–36.0)
MCHC: 34.5 g/dL (ref 30.0–36.0)
MCV: 87.7 fL (ref 78.0–100.0)
MCV: 88.1 fL (ref 78.0–100.0)
MCV: 88.3 fL (ref 78.0–100.0)
MCV: 88.8 fL (ref 78.0–100.0)
MCV: 88.9 fL (ref 78.0–100.0)
MCV: 91.5 fL (ref 78.0–100.0)
MCV: 97.2 fL (ref 78.0–100.0)
MCV: 97.4 fL (ref 78.0–100.0)
Platelets: 134 10*3/uL — ABNORMAL LOW (ref 150–400)
Platelets: 36 10*3/uL — ABNORMAL LOW (ref 150–400)
Platelets: 39 10*3/uL — ABNORMAL LOW (ref 150–400)
Platelets: 73 10*3/uL — ABNORMAL LOW (ref 150–400)
RBC: 2.96 MIL/uL — ABNORMAL LOW (ref 3.87–5.11)
RBC: 3.03 MIL/uL — ABNORMAL LOW (ref 3.87–5.11)
RBC: 3.09 MIL/uL — ABNORMAL LOW (ref 3.87–5.11)
RBC: 3.62 MIL/uL — ABNORMAL LOW (ref 3.87–5.11)
RBC: 4.15 MIL/uL (ref 3.87–5.11)
RDW: 12.8 % (ref 11.5–15.5)
RDW: 13.2 % (ref 11.5–15.5)
RDW: 16.3 % — ABNORMAL HIGH (ref 11.5–15.5)
RDW: 17 % — ABNORMAL HIGH (ref 11.5–15.5)
RDW: 17.5 % — ABNORMAL HIGH (ref 11.5–15.5)
RDW: 17.7 % — ABNORMAL HIGH (ref 11.5–15.5)
RDW: 17.8 % — ABNORMAL HIGH (ref 11.5–15.5)
WBC: 10.3 10*3/uL (ref 4.0–10.5)
WBC: 11.1 10*3/uL — ABNORMAL HIGH (ref 4.0–10.5)
WBC: 13.6 10*3/uL — ABNORMAL HIGH (ref 4.0–10.5)
WBC: 6.9 10*3/uL (ref 4.0–10.5)
WBC: 7.3 10*3/uL (ref 4.0–10.5)
WBC: 9.7 10*3/uL (ref 4.0–10.5)

## 2010-09-11 LAB — COMPREHENSIVE METABOLIC PANEL
ALT: 1274 U/L — ABNORMAL HIGH (ref 0–35)
ALT: 2158 U/L — ABNORMAL HIGH (ref 0–35)
AST: 126 U/L — ABNORMAL HIGH (ref 0–37)
AST: 285 U/L — ABNORMAL HIGH (ref 0–37)
AST: 3793 U/L — ABNORMAL HIGH (ref 0–37)
AST: 71 U/L — ABNORMAL HIGH (ref 0–37)
Albumin: 2.8 g/dL — ABNORMAL LOW (ref 3.5–5.2)
Albumin: 3 g/dL — ABNORMAL LOW (ref 3.5–5.2)
Alkaline Phosphatase: 70 U/L (ref 39–117)
Alkaline Phosphatase: 75 U/L (ref 39–117)
BUN: 16 mg/dL (ref 6–23)
BUN: 16 mg/dL (ref 6–23)
BUN: 19 mg/dL (ref 6–23)
CO2: 20 mEq/L (ref 19–32)
CO2: 23 mEq/L (ref 19–32)
CO2: 26 mEq/L (ref 19–32)
Calcium: 7.6 mg/dL — ABNORMAL LOW (ref 8.4–10.5)
Calcium: 7.9 mg/dL — ABNORMAL LOW (ref 8.4–10.5)
Chloride: 105 mEq/L (ref 96–112)
Chloride: 110 mEq/L (ref 96–112)
Creatinine, Ser: 0.84 mg/dL (ref 0.4–1.2)
Creatinine, Ser: 0.87 mg/dL (ref 0.4–1.2)
Creatinine, Ser: 1.18 mg/dL (ref 0.4–1.2)
GFR calc Af Amer: 53 mL/min — ABNORMAL LOW (ref >60)
GFR calc Af Amer: >60 mL/min (ref >60)
GFR calc Af Amer: >60 mL/min (ref >60)
GFR calc Af Amer: >60 mL/min (ref >60)
GFR calc non Af Amer: 52 mL/min — ABNORMAL LOW (ref >60)
GFR calc non Af Amer: >60 mL/min (ref >60)
GFR calc non Af Amer: >60 mL/min (ref >60)
Glucose, Bld: 103 mg/dL — ABNORMAL HIGH (ref 70–99)
Glucose, Bld: 91 mg/dL (ref 70–99)
Glucose, Bld: 98 mg/dL (ref 70–99)
Potassium: 3 mEq/L — ABNORMAL LOW (ref 3.5–5.1)
Potassium: 3.3 mEq/L — ABNORMAL LOW (ref 3.5–5.1)
Potassium: 3.8 mEq/L (ref 3.5–5.1)
Sodium: 139 mEq/L (ref 135–145)
Total Bilirubin: 1.9 mg/dL — ABNORMAL HIGH (ref 0.3–1.2)
Total Protein: 4.8 g/dL — ABNORMAL LOW (ref 6.0–8.3)
Total Protein: 5.1 g/dL — ABNORMAL LOW (ref 6.0–8.3)
Total Protein: 5.7 g/dL — ABNORMAL LOW (ref 6.0–8.3)

## 2010-09-11 LAB — PREPARE FRESH FROZEN PLASMA

## 2010-09-11 LAB — URINALYSIS, ROUTINE W REFLEX MICROSCOPIC
Glucose, UA: NEGATIVE mg/dL
Leukocytes, UA: NEGATIVE
Nitrite: POSITIVE — AB
Specific Gravity, Urine: 1.043 — ABNORMAL HIGH (ref 1.005–1.030)
pH: 7 (ref 5.0–8.0)

## 2010-09-11 LAB — HEMOGLOBIN A1C
Hgb A1c MFr Bld: 5.6 % (ref <5.7)
Mean Plasma Glucose: 114 mg/dL (ref <117)

## 2010-09-11 LAB — HEMOGLOBIN AND HEMATOCRIT, BLOOD
HCT: 24.8 % — ABNORMAL LOW (ref 36.0–46.0)
Hemoglobin: 8.5 g/dL — ABNORMAL LOW (ref 12.0–15.0)

## 2010-09-11 LAB — HEPARIN INDUCED THROMBOCYTOPENIA PNL
Patient O.D.: 0.117
UFH High Dose UFH H: 0 % Release
UFH Low Dose 0.1 IU/mL: 0 % Release
UFH Low Dose 0.5 IU/mL: 0 % Release
UFH SRA Result: NEGATIVE

## 2010-09-11 LAB — POCT I-STAT 3, ART BLOOD GAS (G3+)
Patient temperature: 95
TCO2: 11 mmol/L (ref 0–100)
pH, Arterial: 7.389 (ref 7.350–7.400)

## 2010-09-11 LAB — CROSSMATCH
ABO/RH(D): O POS
Antibody Screen: NEGATIVE

## 2010-09-11 LAB — BASIC METABOLIC PANEL
BUN: 15 mg/dL (ref 6–23)
BUN: 16 mg/dL (ref 6–23)
BUN: 19 mg/dL (ref 6–23)
CO2: 15 mEq/L — ABNORMAL LOW (ref 19–32)
Calcium: 7.1 mg/dL — ABNORMAL LOW (ref 8.4–10.5)
Calcium: 7.3 mg/dL — ABNORMAL LOW (ref 8.4–10.5)
Chloride: 111 mEq/L (ref 96–112)
Creatinine, Ser: 0.87 mg/dL (ref 0.4–1.2)
GFR calc Af Amer: 42 mL/min — ABNORMAL LOW (ref >60)
GFR calc non Af Amer: 35 mL/min — ABNORMAL LOW (ref >60)
GFR calc non Af Amer: 42 mL/min — ABNORMAL LOW (ref >60)
GFR calc non Af Amer: >60 mL/min (ref >60)
GFR calc non Af Amer: >60 mL/min (ref >60)
Glucose, Bld: 141 mg/dL — ABNORMAL HIGH (ref 70–99)
Glucose, Bld: 224 mg/dL — ABNORMAL HIGH (ref 70–99)
Glucose, Bld: 293 mg/dL — ABNORMAL HIGH (ref 70–99)
Potassium: 2.7 mEq/L — CL (ref 3.5–5.1)
Sodium: 136 mEq/L (ref 135–145)
Sodium: 137 mEq/L (ref 135–145)

## 2010-09-11 LAB — LIPID PANEL
Triglycerides: 50 mg/dL (ref <150)
VLDL: 10 mg/dL (ref 0–40)

## 2010-09-11 LAB — POCT I-STAT, CHEM 8
BUN: 19 mg/dL (ref 6–23)
Calcium, Ion: 1.1 mmol/L — ABNORMAL LOW (ref 1.12–1.32)
Creatinine, Ser: 0.8 mg/dL (ref 0.4–1.2)
Hemoglobin: 14.6 g/dL (ref 12.0–15.0)
Sodium: 139 mEq/L (ref 135–145)
TCO2: 25 mmol/L (ref 0–100)

## 2010-09-11 LAB — MAGNESIUM
Magnesium: 1.8 mg/dL (ref 1.5–2.5)
Magnesium: 1.8 mg/dL (ref 1.5–2.5)
Magnesium: 2 mg/dL (ref 1.5–2.5)

## 2010-09-11 LAB — BLOOD GAS, ARTERIAL
Acid-base deficit: 3.6 mmol/L — ABNORMAL HIGH (ref 0.0–2.0)
Bicarbonate: 20.7 mEq/L (ref 20.0–24.0)
TCO2: 21.8 mmol/L (ref 0–100)
pCO2 arterial: 35.7 mmHg (ref 35.0–45.0)
pO2, Arterial: 114 mmHg — ABNORMAL HIGH (ref 80.0–100.0)

## 2010-09-11 LAB — PREPARE RBC (CROSSMATCH)

## 2010-09-11 LAB — POCT CARDIAC MARKERS: Myoglobin, poc: 78.7 ng/mL (ref 12–200)

## 2010-09-11 LAB — DIC (DISSEMINATED INTRAVASCULAR COAGULATION)PANEL
D-Dimer, Quant: 13.02 ug/mL-FEU — ABNORMAL HIGH (ref 0.00–0.48)
Smear Review: NONE SEEN
aPTT: 30 seconds (ref 24–37)

## 2010-09-11 LAB — PROCALCITONIN: Procalcitonin: 1.16 ng/mL

## 2010-09-11 LAB — URINE MICROSCOPIC-ADD ON

## 2010-09-11 LAB — SAVE SMEAR

## 2010-09-11 LAB — MRSA PCR SCREENING: MRSA by PCR: NEGATIVE

## 2010-09-11 LAB — CK TOTAL AND CKMB (NOT AT ARMC)
CK, MB: 1.5 ng/mL (ref 0.3–4.0)
Relative Index: INVALID (ref 0.0–2.5)
Total CK: 68 U/L (ref 7–177)

## 2010-09-11 LAB — BRAIN NATRIURETIC PEPTIDE: Pro B Natriuretic peptide (BNP): 197 pg/mL — ABNORMAL HIGH (ref 0.0–100.0)

## 2010-09-11 LAB — APTT
aPTT: 35 seconds (ref 24–37)
aPTT: 76 seconds — ABNORMAL HIGH (ref 24–37)

## 2010-09-11 LAB — LACTIC ACID, PLASMA: Lactic Acid, Venous: 4.4 mmol/L — ABNORMAL HIGH (ref 0.5–2.2)

## 2010-09-11 LAB — HEPATIC FUNCTION PANEL
Albumin: 2.7 g/dL — ABNORMAL LOW (ref 3.5–5.2)
Bilirubin, Direct: 0.3 mg/dL (ref 0.0–0.3)
Bilirubin, Direct: 0.4 mg/dL — ABNORMAL HIGH (ref 0.0–0.3)
Indirect Bilirubin: 0.9 mg/dL (ref 0.3–0.9)
Total Bilirubin: 1.2 mg/dL (ref 0.3–1.2)
Total Bilirubin: 1.7 mg/dL — ABNORMAL HIGH (ref 0.3–1.2)

## 2010-09-11 LAB — URINE CULTURE

## 2010-09-28 ENCOUNTER — Other Ambulatory Visit: Payer: Self-pay | Admitting: *Deleted

## 2010-09-28 MED ORDER — CARVEDILOL 6.25 MG PO TABS: 6.2500 mg | ORAL_TABLET | Freq: Two times a day (BID) | ORAL | Status: DC

## 2010-11-07 NOTE — Procedures (Signed)
BYPASS GRAFT EVALUATION   INDICATION:  Follow-up right external iliac SFA and profunda artery  endarterectomy and right external iliac to SFA bypass graft.   HISTORY:  Diabetes:  No.  Cardiac:  No.  Hypertension:  No.  Smoking:  No.  Previous Surgery:  Please see above.   SINGLE LEVEL ARTERIAL EXAM                               RIGHT              LEFT  Brachial:                    190                195  Anterior tibial:             124                215  Posterior tibial:            119                198  Peroneal:  Ankle/brachial index:        0.64               1.10   PREVIOUS ABI:  Date: 01/26/2008  RIGHT:  1.06  LEFT:  1.17   LOWER EXTREMITY BYPASS GRAFT DUPLEX EXAM:   DUPLEX:  Patent right external iliac-to-profunda artery bypass graft.   IMPRESSION:  1. Patent right external iliac-to-profunda bypass graft with no      evidence of focal stenosis.  2. Markedly abnormal ABI with monophasic Doppler waveform noted in the      right leg.  3. Normal ABI with biphasic Doppler waveform noted in the left leg.  4. Status post external iliac-to-profunda bypass graft.   ___________________________________________  Quita Skye. Hart Rochester, M.D.   MG/MEDQ  D:  05/04/2008  T:  05/04/2008  Job:  [id number]

## 2010-11-07 NOTE — Procedures (Signed)
DUPLEX DEEP VENOUS EXAM - LOWER EXTREMITY   INDICATION:  Right leg edema, status post external iliac, superficial  femoral, and profunda femoris artery endarterectomy.   HISTORY:  Edema:  Right leg edema.  Trauma/Surgery:  Right external iliac artery to superficial femoral  artery bypass graft and right external iliac artery, superficial femoral  artery, and profunda artery endarterectomy on 11/19/07.  Pain:  Pain at incision site.  PE:  No.  Previous DVT:  No.  Anticoagulants:  No.  Other:  No.   DUPLEX EXAM:                CFV   SFV   PopV  PTV    GSV                R  L  R  L  R  L  R   L  R  L  Thrombosis    o  o  o     o     o      o  Spontaneous   +  +  +     +     +      +  Phasic        +  +  +     +     +      +  Augmentation  +  +  +     +     +      +  Compressible  +  +  +     +     +      +  Competent     +  +  +     +     +      +   Legend:  + - yes  o - no  p - partial  D - decreased   IMPRESSION:  1. No evidence of right leg deep venous thrombosis.  2. 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  3. 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    _____________________________  Quita Skye. Hart Rochester, M.D.   MC/MEDQ  D:  11/24/2007  T:  11/24/2007  Job:  [id number]

## 2010-11-07 NOTE — Op Note (Signed)
Michelle Gaines, Michelle Gaines             ACCOUNT NO.:  [id number]   MEDICAL RECORD NO.:  [id number]          PATIENT TYPE:  OIB   LOCATION:  2920                         FACILITY:  [facility]   PHYSICIAN:  Juleen China IV, MDDATE OF BIRTH:  Mar 21, 1929   DATE OF PROCEDURE:  11/06/2007  DATE OF DISCHARGE:                               OPERATIVE REPORT   PREOPERATIVE DIAGNOSIS:  Right leg claudication.   POSTOPERATIVE DIAGNOSIS:  Right leg claudication.   PROCEDURE PERFORMED:  1. Ultrasound-guided access, left common femoral artery.  2. Abdominal aortogram.  3. Bilateral lower extremity runoff.  4. Sacral and aortic catheterization.   PROCEDURE:  The patient was identified in the holding area and taken to  room #8.  She was placed in supine on the table.  Bilateral groins were  prepped and draped in the standard sterile fashion.  A time-out was  called.  Using ultrasound, the left common femoral artery was evaluated  and found to be widely patent.  A 1% lidocaine was used for local  anesthesia.  Using ultrasound, the left common femoral artery was  accessed with an 18-gauge needle.  An 035 guidewire was advanced in the  retrograde fashion into the abdominal aorta under fluoroscopic  visualization.  Next, a 5-French sheath was placed.  Over the wire, Omni  flush catheter was placed at the level of L1 and abdominal aortogram was  obtained.  Next, the catheter was pulled down to the aortic bifurcation  and bilateral lower leg extremity runoff as well as pelvic angiogram was  obtained.  Next, using a Bentson wire and an Omni flush catheter, the  aortic bifurcation was cross cut and was placed in the right external  iliac artery and further imaging of the right lower leg was obtained.   FINDINGS:  Aortogram:  Visualized portions of suprarenal abdominal aorta  show minimal disease.  There is single left renal artery, which is  widely patent.  There are 2 right renal arteries which are small  in  size, however, there is no evidence of stenosis.  The infrarenal  abdominal aorta shows no evidence of stenosis.  There are no aneurysmal  changes.  The infrarenal aorta is heavily calcified.  Bilateral common  iliac arteries are widely patent.  Bilateral hypogastric arteries are  patent.  Bilateral external iliac arteries are patent.   Left lower extremity:  There is diffuse disease within the left common  femoral artery with multiple areas of stenoses.  The left superficial  femoral/profunda femoral arteries are widely patent.  There is a small  focal narrowing at the origin of left superficial femoral artery.  The  left popliteal artery is patent.  There is 3-vessel runoff of the left  leg.   Right lower extremity:  The right common femoral artery is occluded.  There is a stenosis at the origin of the right profunda femoral artery.  The right superficial femoral artery is occluded.  It reconstitutes in 2  separate areas at its mid and distal extent.  The popliteal artery is  patent.  The patient has 2-vessel runoff via the posterior  tibial and  peroneal artery and the anterior tibial artery is occluded.   After above images were obtained, decision was made to terminate the  procedure.  The patient tolerated the procedure well and was taken to  recovery room in stable condition.   IMPRESSION:  1. Occlusion of right common femoral artery with stenosis at the      origin of the right profunda femoral artery.  The superficial      femoral artery is occluded, but reconstitutes in its mid and distal      portion.  There is 2-vessel runoff via the posterior tibial and      peroneal artery to the right leg.  2. Moderate diffuse disease within the left common femoral artery with      a mild luminal narrowing at the origin of the left superficial      femoral artery.  There is 3-vessel runoff at the left leg.           ______________________________  V. Charlena Cross, MD   Electronically Signed     VWB/MEDQ  D:  11/06/2007  T:  11/07/2007  Job:  [id number]

## 2010-11-07 NOTE — Procedures (Signed)
BYPASS GRAFT EVALUATION   INDICATION:  Followup right lower extremity revascularization.  Patient  states still has right lower extremity swelling during day, however is  less than previous.   HISTORY:  Diabetes:  No.  Cardiac:  No.  Hypertension:  No.  Smoking:  No.  Previous Surgery:  Endarterectomy of the right external iliac artery,  superficial femoral artery, and profunda artery.  Right external iliac  artery to superficial femoral artery bypass graft with profunda artery  re-implanted into the graft 11/19/2007 by Dr. Hart Rochester.   SINGLE LEVEL ARTERIAL EXAM                               RIGHT              LEFT  Brachial:                    168                170  Anterior tibial:             170                200  Posterior tibial:            180                180  Peroneal:  Ankle/brachial index:        1.06               1.17   PREVIOUS ABI:  Date: 12/15/2007  RIGHT:  1.04  LEFT:  1.06   LOWER EXTREMITY BYPASS GRAFT DUPLEX EXAM:   DUPLEX:  Doppler arterial waveforms appear triphasic within and distal  to the bypass graft.  Unable to image proximal to the graft as well as  the proximal anastomosis due to bowel gas.   IMPRESSION:  1. Bilateral ABIs appear within normal limits and stable from previous      study.  2. Patent right external iliac artery to superficial femoral artery      bypass graft with re-implanted profunda artery.  3. Unable to image native right external iliac artery and proximal      anastomosis due to bowel gas/non-NPO.   ___________________________________________  Quita Skye. Hart Rochester, M.D.   AS/MEDQ  D:  01/26/2008  T:  01/27/2008  Job:  [id number]

## 2010-11-07 NOTE — Op Note (Signed)
NAMEMADISSON, Michelle Gaines             ACCOUNT NO.:  [id number]   MEDICAL RECORD NO.:  [id number]          PATIENT TYPE:  INP   LOCATION:  2012                         FACILITY:  [facility]   PHYSICIAN:  Quita Skye. Hart Rochester, M.D.  DATE OF BIRTH:  January 11, 1929   DATE OF PROCEDURE:  11/19/2007  DATE OF DISCHARGE:                               OPERATIVE REPORT   PREOPERATIVE DIAGNOSES:  Ischemic right leg with severe claudication  secondary to total occlusion right distal external iliac common femoral  artery with superficial femoral occlusive disease.   POSTOPERATIVE DIAGNOSIS:  Ischemic right leg with severe claudication  secondary to total occlusion right distal external iliac common femoral  artery with superficial femoral occlusive disease.   OPERATION:  1. Endarterectomy of right external iliac and superficial femoral      artery with insertion of aright external iliac to superficial      femoral bypass using 7-mm Hemashield Dacron graft.  2. Endarterectomy of right profunda femoris artery with reimplantation      of right profunda into the Dacron graft.   SURGEON:  Quita Skye. Hart Rochester, MD   FIRST ASSISTANT:  Wilmon Arms, MD   ANESTHESIA:  General endotracheal.   PROCEDURE:  The patient was taken to the operating room, placed in the  supine position at which time satisfactory general endotracheal  anesthesia was administered.  The right leg was prepped with Betadine  scrub and solution draped in a routine sterile manner.  A longitudinal  incision was made in the right inguinal area, carried down through the  subcutaneous tissue.  The external iliac artery was exposed well up  beneath the inguinal ligament as far as possible.  It was concentrically  calcified and had a palpable pulse well proximal to the inguinal  ligament but had no pulse in the common femoral artery which was totally  occluded by angiography.  Common femoral, superficial and profunda  femoris arteries were dissected  free out.  Profunda and the superficial  femoral artery were parallel with the profunda being more medial and it  was totally occluded proximally but became a soft vessel at about 5-6 cm  distally.  This was all dissected free as was the superficial femoral  artery which was totally occluded for about 4-5 cm and then it became a  softer vessel although it did have some diffuse disease.  The patient  was heparinized.  Longitudinal opening made in the common femoral  artery.  Through this, heavily calcified plaque extended up proximally  up above the inguinal ligament to a point where the concentric  calcification was still present but there was adequate inflow present.  This was proximal to the total occluded area.  Attempt was made to  endarterectomize the common femoral, profunda and superficial femoral  and initial plan was to possibly place a patch but it was decided the  best plan would be to bypass this because of the long extensive  endarterectomy necessary.  Therefore, the superficial femoral artery was  endarterectomized about 6 cm distal to the origin where it became a  fairly smooth vessel and some  debris was removed from the lumen on the  intimal surface and there was good backbleeding at this point.  The  profunda was endarterectomized down about 3 cm distal to this.  The  vessel was relatively a smooth vessel.  It was transected for later  reimplantation.  A 6 to 7-mm Hemashield Dacron graft was spatulated,  anastomosed end-to-end of the external iliac artery with 6-0 Prolene and  end-to-end to the superficial femoral artery with 6-0 Prolene after  spatulating both ends.  When this was completed, a site for  reimplantation of the profunda into the posterior aspect of the Dacron  graft was selected, opened with an 11 blade, enlarged with 4 mm punch,  end-to-side anastomosis was done with 6-0 Prolene in the posterior wall.  Following this, the clamp released.  There was an  excellent Doppler flow  in all vessels and a palpable popliteal pulse at the knee.  Protamine  was given to reverse the heparin following that for hemostasis.  The  wound was irrigated with saline, closed in layers with Vicryl in  subcuticular fashion.  Sterile dressing applied.  The patient taken to  recovery room in satisfactory condition.      Quita Skye Hart Rochester, M.D.  Electronically Signed     JDL/MEDQ  D:  11/19/2007  T:  11/20/2007  Job:  [id number]

## 2010-11-07 NOTE — Procedures (Signed)
BYPASS GRAFT EVALUATION   INDICATION:  Right lower extremity bypass graft.   HISTORY:  Diabetes:  No.  Cardiac:  No.  Hypertension:  No.  Smoking:  No.  Previous Surgery:  Right external iliac to superficial femoral artery  bypass graft with a reimplanted profunda femoral artery into the graft  on 11/19/2007.   SINGLE LEVEL ARTERIAL EXAM                               RIGHT              LEFT  Brachial:                    179                191  Anterior tibial:             Not detected       239  Posterior tibial:            176                214  Peroneal:                    130  Ankle/brachial index:        0.92               1.25   PREVIOUS ABI:  Date: 11/23/2008  RIGHT:  0.91  LEFT:  1.17   LOWER EXTREMITY BYPASS GRAFT DUPLEX EXAM:   DUPLEX:  Biphasic Doppler waveforms noted throughout the right lower  extremity bypass graft with an increased velocity of 175 cm/s noted at  the origin of the profunda femoral artery which is most likely due to a  change in vessel diameter and the angle of takeoff.  Known occlusion of the right superficial femoral artery.   IMPRESSION:  1. Patent right external iliac to superficial femoral artery bypass      graft with a mildly increased velocity noted, as described above.  2. Stable bilateral ankle brachial indices.   ___________________________________________  Quita Skye Hart Rochester, M.D.   CH/MEDQ  D:  06/22/2009  T:  06/23/2009  Job:  [id number]

## 2010-11-07 NOTE — Procedures (Signed)
BYPASS GRAFT EVALUATION   INDICATION:  Follow up right lower extremity revascularization, left  lower extremity claudication.   HISTORY:  Diabetes:  No.  Cardiac:  No.  Hypertension:  No.  Smoking:  No.  Previous Surgery:  Endarterectomy of right EIA, SFA, and profunda  artery.  Right EIA to SFA bypass graft with profunda reimplanted into  the graft 11/19/2007 by Dr. Hart Rochester.   SINGLE LEVEL ARTERIAL EXAM                               RIGHT              LEFT  Brachial:                    203                201  Anterior tibial:             185 (monophasic)   238 (biphasic)  Posterior tibial:            138 (monophasic)   217 (biphasic)  Peroneal:  Ankle/brachial index:        0.91               1.17   PREVIOUS ABI:  Date: 05/04/2008  RIGHT:  0.64  LEFT:  1.10   LOWER EXTREMITY BYPASS GRAFT DUPLEX EXAM:   DUPLEX:  1. Patent EIA to SFA bypass graft with reimplanted profunda onto it.      However, SFA shows occlusion consistent with previous study.  2. Elevated velocities of profunda anastomosis onto graft of 314 cm/s,      a slight increase from previous study.   IMPRESSION:  1. Right ABI in anterior tibial artery shows increase from previous,      however monophasic, suggestive of possible calcification.  Right      posterior tibial pressure is stable from previous study.  2. Left ABI appears stable with biphasic waveforms.  3. Patent right EIA to SFA bypass graft with reimplanted profunda      artery onto it.  However, occluded superficial femoral artery and      fairly stable elevated velocities at anastomosis of profunda.   ___________________________________________  Quita Skye. Hart Rochester, M.D.   AS/MEDQ  D:  11/23/2008  T:  11/23/2008  Job:  [id number]

## 2010-11-07 NOTE — Assessment & Plan Note (Signed)
OFFICE VISIT   Michelle Gaines, Michelle Gaines  DOB:  August 17, 1928                                       11/24/2007  ZOXWR#:[id number]   The patient is status post right external iliac endarterectomy with  insertion of an external iliac to superficial femoral bypass graft with  Dacron and reimplantation of right profunda femoris artery on  11/19/2007.  She has had some swelling in the right leg and is concerned  about that.  She has also had some burning and numbness on the medial  aspect of the thigh with some mild redness.   EXAM:  She has a nicely-healing incision with no evidence of infection.  There is some mild erythema distal to the incision on the medial part of  the thigh.  She has a 3+ femoral, popliteal, and dorsalis pedis pulse  palpable.  There is 1+ edema throughout the leg up to the mid thigh.   She had a venous duplex exam to rule out deep venous thrombosis today  and that was negative with no DVT noted.  She was reassured regarding  this.  Blood pressure line 185/90, heart rate 73.  She will return in 6  weeks with ABIs and see me unless she develops chills, fever, or  increasing redness in the right leg in the interim.   Quita Skye Hart Rochester, M.D.  Electronically Signed   JDL/MEDQ  D:  11/24/2007  T:  11/25/2007  Job:  [id number]

## 2010-11-07 NOTE — Assessment & Plan Note (Signed)
OFFICE VISIT   Michelle Gaines, Michelle Gaines  DOB:  07/26/1928                                       04/25/2010  EAVWU#:[id number]   Patient was referred today by Dr. Delfin Gaines for possible bilateral  renal artery stenosis and worsening hypertension.  This patient is known  to me, having previously had lower extremity surgery and an angiography  in 2009.  She has some degree of diabetic neuropathy and continues to  have some discomfort in both lower extremities.  She states that her  blood pressures have been ranging between 140-190 systolic and 70-85  diastolic.  She has numbers supporting those statistics.  She had an  ultrasound performed at [facility].  I have reviewed that  report.  This was performed on 04/12/2010.  This revealed some elevated  velocity in the left renal artery with concern of left renal artery  stenosis and 2 right renal arteries with some mild elevation as well.  The patient denies any chest pain, dyspnea on exertion, hemoptysis or  chronic bronchitis.   CHRONIC MEDICAL PROBLEMS:  1. Hypertension.  2. Coronary artery disease, previous PTCA and stenting by Dr. Deborah Gaines      in June of this year.  3. History of hysterectomy for cancer.  4. Negative for hyperlipidemia, COPD, or stroke.   SOCIAL HISTORY:  She is married, has 2 children, and is retired.  Does  not use tobacco or alcohol.   REVIEW OF SYSTEMS:  Positive for chest pain, occasional arthritis, joint  pain.  Decreased hearing, dizziness, constipation.  All other systems  are negative in the review of systems.   PHYSICAL EXAMINATION:  Blood pressure 174/77, heart rate 62,  respirations 14.  General:  She is a thin female who is no apparent  distress, alert and oriented x3.  HEENT:  Normal for age.  EOMs are  intact.  Chest:  Clear to auscultation.  Cardiovascular:  Regular  rhythm.  No murmurs.  Abdomen:  Soft, nontender with no masses.  No  bruits are heard.  She has 3+  femoral pulses bilaterally.  Well diffused  lower extremities.   I have reviewed the previous angiogram performed in 2009 by Dr. Myra Gaines,  which revealed a widely patent left renal artery and 2 small right renal  arteries.  Also reviewed the ultrasound report.  In my opinion, there is  not enough evidence suggesting severe bilateral renal artery stenosis,  which would be a dramatic change from 2 years ago to perform an  angiogram at this time.  I think she should continue to be treated  medically for this.  We will see her back in 1 year with a follow-up  duplex scan of her renal arteries.  I discussed this at length with her  and her husband.     Michelle Gaines, M.D.  Electronically Signed   JDL/MEDQ  D:  04/25/2010  T:  04/26/2010  Job:  [id number]

## 2010-11-07 NOTE — Discharge Summary (Signed)
Michelle Gaines, Michelle Gaines             ACCOUNT NO.:  [id number]   MEDICAL RECORD NO.:  [id number]          PATIENT TYPE:  INP   LOCATION:  2012                         FACILITY:  [facility]   PHYSICIAN:  Quita Skye. Hart Rochester, M.D.  DATE OF BIRTH:  1928/08/05   DATE OF ADMISSION:  11/19/2007  DATE OF DISCHARGE:  11/20/2007                               DISCHARGE SUMMARY   DISCHARGE DIAGNOSES:  1. Ischemic right leg with severe claudication secondary to total      occlusion of the right distal external iliac, common femoral artery      with superficial femoral occlusive disease.  2. Hypertension.  3. History of ovarian cancer.   PROCEDURES PERFORMED:  1. Endarterectomy of right external iliac and superficial femoral      artery with insertion of a right external iliac to superficial      femoral bypass using a 7-mm Hemashield Dacron graft.  2. Endarterectomy of right profunda femoris artery with reimplantation      of right profunda into Dacron graft by Dr. Hart Rochester, Nov 19, 2007.   DISCHARGE MEDICATIONS:  1. Calcium 2000 mg p.o. daily.  2. CoQ10 p.o. daily.  3. CarniShield p.o. daily.  4. Vitamin E 400 international units p.o. daily.  5. Potassium 99 mg p.o. daily.  6. DC3 p.o. daily.  7. Magnesium 500 mg 2 p.o. daily.  8. Beta-carotene p.o. daily.  9. Vitamin C 200 mg 2 p.o. daily.  10.Darvocet-N 100 one p.o. q.4 h p.r.n. pain, total #20 given.   CONDITION AT DISCHARGE:  Stable and improving.   DISPOSITION:  She is being discharged home in stable condition with her  wound healing well.  She is to clean the wound with soap and warm water  and keep a dry gauze dressing in her right groin.  She is to observe the  wound for drainage, increasing redness, swelling, pain, and fever  greater than 101.2.  She is instructed to increase her activity slowly.  She may walk with assistance and may walk up steps.  She may shower on  and after Nov 21, 2007.  She should not lift objects for 2 weeks.   She  should not drive for 3 weeks.  She will be given an appointment with Dr.  Hart Rochester in 3 weeks for followup with ABIs.  The office will call the  appointment to her.  Brief identifying statement for complete details,  please refer to the typed history and physical.  Briefly, this very  pleasant 77 year old woman has right calf claudication symptoms which  are limiting her ability to walk.  Dr. Hart Rochester evaluated her and felt  that she should undergo endarterectomy of her right iliac and femoral  arterial systems.  She was informed of the risks and benefits of the  procedure, and after careful consideration elected to proceed with  surgery.   HOSPITAL COURSE:  Preoperative workup was completed as an outpatient.  She was admitted to the same-day surgery and underwent the  aforementioned operative procedure.  For complete details, please refer  to the typed operative report.  The procedure was without complications.  She was returned to the Post Anesthesia Care Unit extubated.  Following  stabilization, she was transferred to a bed on the surgical convalescent  floor.  She was observed overnight.  The following morning, she was  feeling very well, she was having some discomfort which was incisional  in nature.  She was desirous of going home.  As she had not walked, we  elected to observe her mobility and she was able to walk and was doing  very well.  She was evaluated later in the evening and felt stable and  was discharged home.      Wilmon Arms, PA      Quita Skye Hart Rochester, M.D.  Electronically Signed    KEL/MEDQ  D:  11/20/2007  T:  11/21/2007  Job:  [id number]

## 2010-11-07 NOTE — Assessment & Plan Note (Signed)
[facility]                                 ON-CALL NOTE   JAKYA, DOVIDIO                    MRN:          [id number]  DATE:06/25/2010                            DOB:          [birth year]    PRIMARY CARDIOLOGIST:  Dr. Roger Shelter.   I received a call from Mr. Lyann Hagstrom the husband of Michelle Gaines  this morning reporting that Michelle Gaines, since waking this morning, has  been having tachy palpitations, weakness, intermittent chest pain that  seems to be worse with deep breathing, and also low blood pressures in  the 100 range.   Mr. Abdelaziz is concerned and states that he would like to bring her to  the [facility] ED.  I have recommended that she should come into the ED  for evaluation and in fact if she is feeling poorly, should have EMS  come out to the house.  He was not clear as to whether or not he was  calling EMS but was certain that she will be coming to the ED today.     Nicolasa Ducking, ANP     CB/MedQ  DD: 06/25/2010  DT: 06/25/2010  Job #: [id number]

## 2010-11-07 NOTE — Assessment & Plan Note (Signed)
OFFICE VISIT   Michelle Gaines, Michelle Gaines  DOB:  20-Apr-1929                                       05/04/2008  [id number]#:[id number]   The patient returns today for continued followup having undergone a  right external iliac to superficial femoral bypass with reimplantation  of profunda femoris artery on May 27 for a totally occluded common  superficial and profunda femoris artery with severe calcification.  Initially she had an ABI of 1.0 and that has now decreased to 0.64.  It  appears that the superficial femoral artery is occluded although the  graft into the profunda is widely patent.  She denies any claudication  symptoms after walking three-quarters of a mile and is not having any  rest pain or history of nonhealing ulcers.  The swelling in the right  leg has improved since her last visit.  She does have some neuropathy  type symptoms around her incisions with some numbness and tingling which  seems to be slowly improving.  She has symptoms of the contralateral  left leg.  She denies any neurologic symptoms such as hemiparesis,  aphasia, amaurosis fugax, diplopia, blurred vision or syncope.  She also  denies any chest pain, dyspnea on exertion, PND or orthopnea.   I answered multiple questions for her today.  Her blood pressure today  is 168/88, heart rate 88, respirations 14.  She has a 3+ femoral pulse  on the right with absent popliteal and distal pulse.  Well-perfused  lower extremity on the right.  Left leg has a 3+ femoral, 2+ dorsalis  pedis pulse.   I have reassured her regarding these findings.  She is much better than  she was preoperatively and would be a candidate for right femoral  popliteal bypass grafting if her symptoms worsened.  At the present time  I do not think this is necessary but she will return to see [place] on a  p.r.n. basis if her symptoms become more limiting.   Quita Skye Hart [place], M.D.  Electronically Signed   JDL/MEDQ  D:  05/04/2008   T:  05/05/2008  Job:  [id number]

## 2010-11-07 NOTE — Procedures (Signed)
BYPASS GRAFT EVALUATION   INDICATION:  Follow up right lower extremity bypass graft.   HISTORY:  Diabetes:  No.  Cardiac:  No.  Hypertension:  No.  Smoking:  No.  Previous Surgery:  Right external iliac artery to superficial femoral  artery bypass graft with reimplanted profunda femoral artery into the  graft on 11/19/2007.   SINGLE LEVEL ARTERIAL EXAM                               RIGHT              LEFT  Brachial:                    143                142  Anterior tibial:             137                202  Posterior tibial:            88                 144  Peroneal:  Ankle/brachial index:        0.96 (TBI = 0.43)  1.41 (TBI = 0.73)   PREVIOUS ABI:  Date: 06/21/2009  RIGHT:  0.92  LEFT:  1.25   LOWER EXTREMITY BYPASS GRAFT DUPLEX EXAM:   DUPLEX:  1. Biphasic Doppler waveforms noted throughout the right lower      extremity bypass graft.  There is an increased velocity of 193 cm/s      noted in the right proximal profunda femoral artery, which is most      likely due to a change in vessel diameter and the angle of the      vessel take-off.  2. Known occlusion of the right superficial femoral artery.   IMPRESSION:  1. Patent right external iliac to superficial femoral artery bypass      graft with known occlusion of the superficial femoral artery.  2. Known increase in velocity of the right proximal profunda femoral      artery noted, as described above.  3. Stable bilateral ankle brachial indices noted; however, the ankle      pressures are unreliable due to possible vessel calcification.      Moderately decreased right toe brachial index with a normal left      toe brachial index noted.   ___________________________________________  Quita Skye Hart [place], M.D.   CH/MEDQ  D:  12/20/2009  T:  12/21/2009  Job:  [id number]

## 2010-11-07 NOTE — Assessment & Plan Note (Signed)
OFFICE VISIT   Michelle Gaines, Michelle Gaines  DOB:  [birth year]                                       12/15/2007  [id number]#:[id number]   The patient returned today for further followup regarding her lower  extremity bypass graft performed by me May 27th for an ischemic right  leg.  She had external iliac endarterectomy with insertion of bypass  from her external iliac to superficial femoral artery reimplantation of  her profunda femoris.  She does have postoperative edema which we noted  in early June and has changed very little.  This happens mostly as the  day progresses.  It is not present when she awakens in the morning.  She  has had a negative venous duplex exam for DVT.  She also complains that  her ankles are stiff and ache.   EXAM:  She does have 1 to 2+ edema in the right lower extremity with 3+  posterior tibial pulse on the right  and a 2 to 3+ dorsalis pedis pulse  on the left.  Right inguinal incision has healed nicely.   ABIs were checked today and were greater than 1.0 bilaterally.  I have  reassured her regarding these findings and the fact that she has  excellent circulation to both legs.  She should elevate her legs as much  as possible during the day and night, and in the future, if necessary,  we can fit her with elastic compression stockings.  She will be followed  on a regular basis on the protocol.   Quita Skye Hart [place], M.D.  Electronically Signed   JDL/MEDQ  D:  12/15/2007  T:  12/16/2007  Job:  [id number]

## 2010-11-07 NOTE — Assessment & Plan Note (Signed)
OFFICE VISIT   Michelle Gaines, Michelle Gaines  DOB:  May 23, 1929                                       11/23/2008  [id number]#:[id number]   The patient is 1 year post extensive reconstruction of the right femoral  area with endarterectomy of the external iliac artery and interposition  graft to her superficial femoral artery with reimplantation of her  profunda.  The right superficial femoral was known to be severely  diseased at that time.  It eventually occluded though she has stable  circulation in the right leg with ABI ranging from 0.65 to 0.91 with  known occlusion of her right superficial femoral artery.  The left leg  has remained with an ABI of 1.1.  She continues to have symptoms  consistent with diabetic neuropathy such as a tight feeling in the back  of her calves and some numbness in her feet.  The left leg has some  symptoms in the posterior thigh and calf which occur periodically which  concerns her.  She has no hemispheric or nonhemispheric TIAs, denies any  chest pain, dyspnea on exertion, PND or orthopnea.   PHYSICAL EXAMINATION:  Vital signs:  Blood pressure 186/96, heart rate  is 84, respirations 14.  Neck:  Carotid pulses 3+, no audible bruits.  Abdomen:  Soft, nontender with no masses.  She has 3+ femoral pulse in  the right leg with absent distal pulses and well-perfused lower  extremity.  Left leg has 3+ femoral, popliteal and dorsalis pedis pulse.   I explained to her that her circulation is not the issue and is not  causing these symptoms in either leg.  I think she has some degree of  neuropathy.  She did have some nerve root compression in the left leg  causing symptoms down the posterior aspect.  She will discuss this  further with Dr. Chilton Si and if she feels it is severe enough she could be  seen by an orthopedic surgeon or neurosurgeon to rule out nerve  compression although I do not really think she has a serious problem  going on related to  this.  I have explained to her that her circulation  is stable and will continue to follow her on a regular basis.   Quita Skye Hart [place], M.D.  Electronically Signed   JDL/MEDQ  D:  11/23/2008  T:  11/24/2008  Job:  [id number]

## 2010-11-07 NOTE — Consult Note (Signed)
VASCULAR SURGERY CONSULTATION   TORRANCE, FRECH  DOB:  December 20, 1928                                       10/28/2007  [id number]#:[id number]   The patient is a 77 year old female who has right calf claudication  symptoms, which are increasingly limiting her ability to walk as much as  she would like.  She used to walk a quarter of a mile multiple times and  now is able to only walk this one time and states that the right leg  becomes numb and uncomfortable, requiring her to stop.  She has no  symptoms in the left leg.  She feels unstable, as if she might fall.  She would like treatment for this.  She has no history of ischemia or  gangrene in the right leg.   PAST MEDICAL HISTORY:  1. Hypertension.  2. History of ovarian cancer.  3. Is negative for diabetes, coronary artery disease, hyperlipidemia,      COPD or stroke.   PREVIOUS SURGERY:  1. Includes a hysterectomy and bilateral oophorectomy for ovarian      cancer in 1967.  2. Fractured left leg treatment with internal fixation 1987.   FAMILY HISTORY:  Positive for PTCA - coronary artery disease and fem-pop  disease in her mother.  Positive for diabetes in the sister.  Negative  for stroke.   SOCIAL HISTORY:  She is married and is retired.  She does not use  tobacco or alcohol.   REVIEW OF SYSTEMS:  Unremarkable with the exception of previous  description of a heart murmur many years ago.  She has constipation,  occasional dizziness, and arthritis and joint pain.   ALLERGIES:  None known.   PHYSICAL EXAM:  Blood pressure is 198/90, heart rate 73, respirations  14.  Generally, she is an elderly female who appears quite active.  She  is alert and oriented x3.  Neck is supple with 3+ carotid pulses  palpable.  No bruits are audible.  Neurologic exam is normal.  No  palpable adenopathy in the neck.  The upper extremity pulses are 3+  bilaterally.  Chest:  Clear to auscultation.  Cardiovascular exam  reveals  a regular rhythm with no murmurs.  Her abdomen is soft,  nontender with no palpable masses.  Left leg has 3+ femoral, 2+  popliteal, and 2+ dorsalis pedis pulse.  Right leg has a very weak to  absent femoral pulse with no distal pulses.  Right foot is slightly  paler than the left with no tenderness in the calf and intact sensation.   Lower extremity arterial Dopplers revealed ABI of 0.65 on the right and  0.99 on the left with monophasic flow on the right and biphasic in the  left posterior tibial.   I think she does have primarily femoral popliteal occlusive disease but  may have a totally occluded common femoral artery or external iliac  artery on the right.  She would like to have this evaluated and we will  schedule her for an angiogram to be performed by Dr. Myra Gianotti on May 14th  to see what options are available.  She will return to see me on May  19th to discuss these findings.  She understands that PTA and stenting  over iliac system could be performed at the time of her angiogram if  indicated.  Quita Skye Hart [place], M.D.  Electronically Signed  JDL/MEDQ  D:  10/28/2007  T:  10/29/2007  Job:  [id number]   cc:   Veverly Fells. Ophelia Charter, M.D.  Erskine Speed, M.D.

## 2010-11-10 NOTE — Op Note (Signed)
[place]. [facility]  Patient:    Michelle Gaines, Michelle Gaines                    MRN: [id number] Proc. Date: 10/01/00 Adm. Date:  [id number] Attending:  Meredith Leeds                           Operative Report  PREOPERATIVE DIAGNOSIS:  Anal fissure and external hemorrhoid skin tag.  POSTOPERATIVE DIAGNOSIS:  Anal fissure and external hemorrhoid skin tag.  OPERATION PERFORMED:  Excision of external hemorrhoid and repair of anal fissure.  SURGEON:  Zigmund Daniel, M.D.  ANESTHESIA:  General.  DESCRIPTION OF PROCEDURE:  After adequate monitoring and anesthesia and routine preparation and draping of the perianal region, I thoroughly examined the area and found some small internal hemorrhoids, very prominent anterior skin tag and just above that, a chronic-appearing anal fissure.  I cauterized the granulation tissue of the fissure.  I excised the skin tag.  I then closed the defect with a running 2-0 chromic including the area of excision and the fissure.  I then made a small cut on the right lateral anal canal  right over the hypertrophic portion of the internal sphincter, palpated it and cut it relaxing the internal sphincter nicely.  Hemostasis was not a problem.  I thoroughly anesthetized the perianal region.  I closed the skin of the sphincterotomy site with a single 3-0 chromic stitch.  The patient tolerated the procedure well. DD:  10/01/00 TD:  10/01/00 Job: [id number]

## 2010-11-10 NOTE — Assessment & Plan Note (Signed)
OFFICE VISIT   RAMIYAH, MCCLENAHAN  DOB:  Mar 10, 1929                                       05/29/2010  [id number]#:[id number]   I did not see the patient in the office today but did speak with her  medical doctor, Dr. Elmore Guise.  He states that her blood pressure is much  worse over the last few months than it had been previously and is  concerned about the ultrasound report at [facility] suggesting a  left renal artery stenosis.   I did review the angiograms, as noted in the previous notes from 2009,  which Dr. Myra Gianotti performed.  This revealed a widely patent left renal  artery at that time.   Dr. Elmore Guise will order a CT angiogram of her renal arteries to see if  it appears there has been any change.  If so, he will be in touch with  me for a follow-up examination and possible angiogram.     Quita Skye. Hart [place], M.D.  Electronically Signed   JDL/MEDQ  D:  05/29/2010  T:  05/30/2010  Job:  [id number]

## 2010-11-10 NOTE — Procedures (Signed)
[place]. [facility]  Patient:    Michelle Gaines, Michelle Gaines Visit Number: [id number] MRN: [id number]          Service Type: END Location: ENDO Attending Physician:  Orland Mustard Dictated by:   Llana Aliment. Randa Evens, M.D. Proc. Date: 01/15/02 Admit Date:  01/15/2002 Discharge Date: 01/15/2002   CC:         Antony Madura, M.D.  Zigmund Daniel, M.D.   Procedure Report  DATE OF BIRTH:  2029-05-31  PROCEDURE:  Colonoscopy.  MEDICATIONS:  Fentanyl 20 mcg, Versed 2 mg, Phenergan 12.5 mg.  SCOPE:  Olympus pediatric video colonoscope.  INDICATIONS:  The patient had problems with her anal area.  She has had a previous history of colon polyps and is due for three year polyp.  DESCRIPTION OF PROCEDURE:  The procedure was explained to the patient and the consent obtained.  With the patient in the left lateral decubitus position, the Olympus pediatric colonoscope was inserted and advanced under direct visualization.  The prep was excellent.  We were able to reach the cecum without difficulty.  Ileocecal valve and appendiceal orifice were seen.  The scope was withdrawn from the cecum and ascending colon, hepatic flexure, transverse colon, splenic flexure and descending colon were seen well.  No polyps were seen throughout the entire colon.  The colon was somewhat tortuous.  The scope was withdrawn from the rectum.  The patient did have internal hemorrhoids and no polyps were seen.  She tolerated the procedure well and was maintained on low flow oxygen and pulse oximeter throughout the procedure.  ASSESSMENT: 1. No evidence of further polyps. 2. Internal hemorrhoids. 3. No other abnormalities seen in the anal canal.  PLAN:  Recommend repeating colonoscopy in five years. Dictated by:   Llana Aliment. Randa Evens, M.D. Attending Physician:  Orland Mustard DD:  01/15/02 TD:  01/18/02 Job: [id number]

## 2010-11-30 ENCOUNTER — Telehealth: Payer: Self-pay | Admitting: Cardiology

## 2010-11-30 NOTE — Telephone Encounter (Signed)
PT WANTING TO KNOW WHY KELLY HAS NOT CALLED HER WITH WHO SHE WILL BE SEEING AFTER DR Deborah Chalk LEAVES AS KELLY HAS ALREADY ASSIGNED A DOCTOR FOR HER HUSBAND WHO IS ALSO A PT. IN ADDITION TO, SHE WANTS A NURSE HERE TODAY TO CALL HER AND GO OVER HER LAST HDL AND LDL NUMBERS WITH HER FROM HER LAST LABS. CHART IN BOX.

## 2010-11-30 NOTE — Telephone Encounter (Signed)
Called wanting to know why she had not been assigned to another physician. Advised that she had been assigned to Dr. [name] and would receive a call for an app in Aug. Also she wanted to know when last chol check was done. Advised done in hosp in Jan; gave her results.

## 2010-12-19 ENCOUNTER — Encounter (INDEPENDENT_AMBULATORY_CARE_PROVIDER_SITE_OTHER): Payer: Medicare Other

## 2010-12-19 ENCOUNTER — Ambulatory Visit (INDEPENDENT_AMBULATORY_CARE_PROVIDER_SITE_OTHER): Payer: Medicare Other | Admitting: Vascular Surgery

## 2010-12-19 DIAGNOSIS — I739 Peripheral vascular disease, unspecified: Secondary | ICD-10-CM

## 2010-12-19 DIAGNOSIS — Z48812 Encounter for surgical aftercare following surgery on the circulatory system: Secondary | ICD-10-CM

## 2010-12-19 DIAGNOSIS — I7092 Chronic total occlusion of artery of the extremities: Secondary | ICD-10-CM

## 2010-12-19 NOTE — Assessment & Plan Note (Signed)
OFFICE VISIT  Michelle Gaines, Michelle Gaines DOB:  [birth year]                                       12/19/2010 [id number]#:[id number]  Michelle Gaines is an 77 year old female well known to me who had a right lower extremity bypass performed for ischemic leg in May 2009.  She had endarterectomy of the right external iliac artery with an interposition graft to the superficial femoral artery and reimplantation of the right profunda.  She has gone on to occlude the proximal right SFA, but the profunda is widely patent.  She has excellent ABIs and has no claudication symptoms.  She is able to walk at least a block with no symptoms.  She denies any rest pain.  She does have occasional tingling in the right leg, probably due to neuropathy or nerve irritation.  CHRONIC MEDICAL PROBLEMS: 1. Hypertension. 2. Coronary artery disease, previous PTCA and stenting by Dr. Deborah Chalk     in June 2011. 3. History of hysterectomy for cancer. 4. Negative for hyperlipidemia, COPD or stroke.  SOCIAL HISTORY:  She is married, has 2 children and is retired.  She does not use tobacco or alcohol.  REVIEW OF SYSTEMS:  Positive for chest pain, arthritis, joint pain, decreased hearing, dizziness, constipation.  All other systems are negative.  PHYSICAL EXAMINATION:  Vital Signs:  Blood pressure 160/89, heart rate 58, respirations 18.  General:  She is a well-developed, well-nourished, thin elderly female in no apparent distress, alert and oriented x3. HEENT:  Exam is normal for age.  EOMs intact.  Lungs:  Clear to auscultation.  No rhonchi or wheezing.  Cardiovascular:  Exam reveals a regular rhythm with no murmurs.  Carotid pulses 3+ with no bruits. Abdomen:  Soft, nontender with no masses.  Extremities:  The right leg has 3+ femoral pulse.  Left leg has 2+ femoral, 2+ popliteal and 2+ posterior tibial pulse.  Both feet are well perfused.  I ordered lower extremity arterial Dopplers today which I  have reviewed and interpreted.  ABI is 1.0 on the right, probably falsely elevated from calcification, and 1.1 on the left.  I have reassured her regarding these findings and will follow her again in 1 year unless her symptoms worsen.    Quita Skye Hart [place], M.D. Electronically Signed  JDL/MEDQ  D:  12/19/2010  T:  12/19/2010  Job:  [id number]

## 2011-01-02 NOTE — Procedures (Unsigned)
BYPASS GRAFT EVALUATION  INDICATION:  Followup right lower extremity bypass graft.  HISTORY: Diabetes:  No. Cardiac:  Yes. Hypertension:  Yes. Smoking:  No. Previous Surgery:  Right external iliac artery to superficial femoral artery bypass graft with reimplanted profunda femoral artery into the graft on 11/19/2007.  SINGLE LEVEL ARTERIAL EXAM                              RIGHT              LEFT Brachial:                    161                160 Anterior tibial:             162                168 Posterior tibial:            142                177 Peroneal: Ankle/brachial index:        1.01               1.10  PREVIOUS ABI:  Date:  12/20/2009  RIGHT:  1.01  LEFT:  1.10  LOWER EXTREMITY BYPASS GRAFT DUPLEX EXAM:  DUPLEX:  Biphasic/monophasic waveforms noted throughout the right lower extremity bypass graft. Increased velocities of 340 cm/s noted in the right proximal profunda artery which is most likely due to change in vessel diameter.  Known occlusion of the right superficial femoral artery.  IMPRESSION: 1. Patent right external iliac artery to superficial femoral artery     bypass graft with known occlusion of the superficial femoral     artery. 2. Known increase in velocity of right proximal profunda artery. 3. Unreliable ankle brachial indices due to possible vessel     calcification.  ___________________________________________ Quita Skye Hart [place], M.D.  EM/MEDQ  D:  12/19/2010  T:  12/19/2010  Job:  [id number]

## 2011-01-09 ENCOUNTER — Other Ambulatory Visit: Payer: Self-pay | Admitting: Radiology

## 2011-01-10 ENCOUNTER — Encounter: Payer: Self-pay | Admitting: Cardiovascular Disease

## 2011-02-14 ENCOUNTER — Ambulatory Visit: Payer: Self-pay | Admitting: Cardiovascular Disease

## 2011-02-24 DEATH — deceased

## 2011-03-15 ENCOUNTER — Encounter: Payer: Self-pay | Admitting: Internal Medicine

## 2011-03-15 ENCOUNTER — Ambulatory Visit (INDEPENDENT_AMBULATORY_CARE_PROVIDER_SITE_OTHER): Payer: Medicare Other | Admitting: Internal Medicine

## 2011-03-15 DIAGNOSIS — I4892 Unspecified atrial flutter: Secondary | ICD-10-CM

## 2011-03-15 DIAGNOSIS — G2581 Restless legs syndrome: Secondary | ICD-10-CM

## 2011-03-15 DIAGNOSIS — I10 Essential (primary) hypertension: Secondary | ICD-10-CM

## 2011-03-15 NOTE — Progress Notes (Signed)
HPI Michelle Gaines returns today for followup. She is a pleasant 77 yo woman with a h/o atrial flutter s/p catheter ablation. In the interim, she has had no palpitations, chest pain, or shortness of breath. She has restless legs at nighttime and notes chronic arthritic complaints. She has not had syncope. No peripheral edema. She initially notes that she had lost some weight but this has leveled out. Allergies  Allergen Reactions  . Amlodipine   . Cephalexin   . Clindamycin/Lincomycin   . Clorazepate   . Codeine   . Doxycycline   . Fexofenadine   . Hydralazine   . Labetalol   . Lisinopril   . Penicillins   . Phenylephrine   . Zolpidem      Current Outpatient Prescriptions  Medication Sig Dispense Refill  . acetaminophen (TYLENOL) 325 MG tablet Take 650 mg by mouth every 6 (six) hours as needed.        . carvedilol (COREG) 6.25 MG tablet Take 1 tablet (6.25 mg total) by mouth 2 (two) times daily.  60 tablet  6  . LORazepam (ATIVAN) 1 MG tablet Take 1 mg by mouth as directed.        . multivitamin (THERAGRAN) per tablet Take 1 tablet by mouth daily.        . nitroGLYCERIN (NITROSTAT) 0.4 MG SL tablet Place 0.4 mg under the tongue every 5 (five) minutes as needed.        . polyethylene glycol powder (MIRALAX) powder Take 17 g by mouth as needed.           Past Medical History  Diagnosis Date  . Hypertension   . PVD (peripheral vascular disease)   . Coronary artery disease     ROS:   All systems reviewed and negative except as noted in the HPI.   No past surgical history on file.   No family history on file.   History   Social History  . Marital Status: Married    Spouse Name: N/A    Number of Children: N/A  . Years of Education: N/A   Occupational History  . Not on file.   Social History Main Topics  . Smoking status: Never Smoker   . Smokeless tobacco: Not on file  . Alcohol Use: No  . Drug Use:   . Sexually Active:    Other Topics Concern  . Not on  file   Social History Narrative  . No narrative on file     Ht 5\' 4"  (1.626 m)  Wt 91 lb (41.277 kg)  BMI 15.62 kg/m2  Physical Exam:  Well appearing elderly woman, NAD HEENT: Unremarkable Neck:  No JVD, no thyromegally Lymphatics:  No adenopathy Back:  No CVA tenderness Lungs:  Clear with no wheezes, rales, or rhonchi. HEART:  Regular rate rhythm, no murmurs, no rubs, no clicks Abd:  soft, positive bowel sounds, no organomegally, no rebound, no guarding Ext:  2 plus pulses, no edema, no cyanosis, no clubbing Skin:  No rashes no nodules Neuro:  CN II through XII intact, motor grossly intact  EKG Normal sinus rhythm.  Assess/Plan:

## 2011-03-15 NOTE — Assessment & Plan Note (Signed)
Her blood pressure today is well controlled. I have encouraged her to maintain a low-sodium diet and she will continue her current medical therapy.

## 2011-03-15 NOTE — Assessment & Plan Note (Signed)
She has had no recurrent palpitations or episodes of unexplained dyspnea. At this time there is no evidence of any recurrent atrial arrhythmias.

## 2011-03-15 NOTE — Assessment & Plan Note (Signed)
Her symptoms are not particularly bothersome to her although they are somewhat bothersome to her husband. If they worsen, I have asked her to consider neurologic evaluation.

## 2011-03-15 NOTE — Patient Instructions (Signed)
Your physician wants you to follow-up in: 12 months with Dr. Taylor. You will receive a reminder letter in the mail two months in advance. If you don't receive a letter, please call our office to schedule the follow-up appointment.    

## 2011-03-21 LAB — URINALYSIS, ROUTINE W REFLEX MICROSCOPIC
Bilirubin Urine: NEGATIVE
Glucose, UA: NEGATIVE
Hgb urine dipstick: NEGATIVE
Ketones, ur: NEGATIVE
Specific Gravity, Urine: 1.008
Specific Gravity, Urine: 1.01
pH: 8
pH: 8

## 2011-03-21 LAB — COMPREHENSIVE METABOLIC PANEL
ALT: 22
ALT: 21
AST: 28
Albumin: 3.6
Alkaline Phosphatase: 53
CO2: 26
Calcium: 9.3
Chloride: 104
Chloride: 102
GFR calc Af Amer: >60
GFR calc non Af Amer: 58 — ABNORMAL LOW
Glucose, Bld: 89
Potassium: 3.2 — ABNORMAL LOW
Sodium: 140
Sodium: 136
Total Protein: 6.2

## 2011-03-21 LAB — CBC
Hemoglobin: 11.8 — ABNORMAL LOW
Hemoglobin: 12.1
MCHC: 34.4
MCHC: 33.8
MCHC: 34.6
MCHC: 35.4
MCV: 94.4
Platelets: 140 — ABNORMAL LOW
Platelets: 146 — ABNORMAL LOW
RBC: 3.63 — ABNORMAL LOW
RBC: 3.35 — ABNORMAL LOW
RBC: 3.88
RDW: 13.1
RDW: 12.9
WBC: 4.1
WBC: 13 — ABNORMAL HIGH
WBC: 3.7 — ABNORMAL LOW

## 2011-03-21 LAB — BASIC METABOLIC PANEL
BUN: 13
BUN: 13
CO2: 22
CO2: 25
Calcium: 8.4
Calcium: 8.5
Creatinine, Ser: 0.82
Creatinine, Ser: 0.95
GFR calc Af Amer: >60
Glucose, Bld: 114 — ABNORMAL HIGH

## 2011-03-21 LAB — URINE MICROSCOPIC-ADD ON

## 2011-03-21 LAB — CROSSMATCH

## 2011-03-23 NOTE — Progress Notes (Signed)
Addended by: Burnett Kanaris A on: 03/23/2011 12:35 PM   Modules accepted: Orders

## 2011-04-24 ENCOUNTER — Other Ambulatory Visit: Payer: Self-pay

## 2011-04-24 ENCOUNTER — Ambulatory Visit: Payer: Self-pay | Admitting: Vascular Surgery

## 2011-05-10 ENCOUNTER — Encounter: Payer: Self-pay | Admitting: Internal Medicine

## 2011-11-16 ENCOUNTER — Encounter: Payer: Self-pay | Admitting: Cardiology

## 2011-12-24 ENCOUNTER — Encounter: Payer: Self-pay | Admitting: Neurosurgery

## 2011-12-25 ENCOUNTER — Encounter: Payer: Self-pay | Admitting: Neurosurgery

## 2011-12-25 ENCOUNTER — Encounter (INDEPENDENT_AMBULATORY_CARE_PROVIDER_SITE_OTHER): Payer: Medicare Other | Admitting: *Deleted

## 2011-12-25 ENCOUNTER — Ambulatory Visit (INDEPENDENT_AMBULATORY_CARE_PROVIDER_SITE_OTHER): Payer: Medicare Other | Admitting: *Deleted

## 2011-12-25 ENCOUNTER — Ambulatory Visit (INDEPENDENT_AMBULATORY_CARE_PROVIDER_SITE_OTHER): Payer: Medicare Other | Admitting: Neurosurgery

## 2011-12-25 VITALS — BP 152/78 | HR 54 | Resp 16 | Ht 64.0 in | Wt 88.2 lb

## 2011-12-25 DIAGNOSIS — Z48812 Encounter for surgical aftercare following surgery on the circulatory system: Secondary | ICD-10-CM

## 2011-12-25 DIAGNOSIS — I739 Peripheral vascular disease, unspecified: Secondary | ICD-10-CM

## 2011-12-25 NOTE — Progress Notes (Signed)
[facility] Office Note  CC: Annual bypass graft and ABIs Referring Physician: Hart Rochester  History of Present Illness: 78 year old female patient of Dr. Candie Chroman who is status post right EIA to SFA graft in transposition of PFA in 2009. Patient states she does have some pain with walking but not true claudication. The patient denies rest pain as well as any open ulcerations. The patient states that her blood pressure has been under good control the last several months.  Past Medical History  Diagnosis Date  . Hypertension   . Coronary artery disease   . PVD (peripheral vascular disease)   . Peripheral arterial disease     ROS: [x]  Positive   [ ]  Denies    General: [ ]  Weight loss, [ ]  Fever, [ ]  chills Neurologic: [x ] Dizziness, [ ]  Blackouts, [ ]  Seizure [ ]  Stroke, [ ]  "Mini stroke", [ ]  Slurred speech, [ ]  Temporary blindness; [ ]  weakness in arms or legs, [ ]  Hoarseness Cardiac: [ ]  Chest pain/pressure, [ ]  Shortness of breath at rest [ ]  Shortness of breath with exertion, [ ]  Atrial fibrillation or irregular heartbeat Vascular: [ ]  Pain in legs with walking, [ ]  Pain in legs at rest, [ ]  Pain in legs at night,  [ ]  Non-healing ulcer, [ ]  Blood clot in vein/DVT,   Pulmonary: [ ]  Home oxygen, [ ]  Productive cough, [ ]  Coughing up blood, [ ]  Asthma,  [ ]  Wheezing Musculoskeletal:  [ ]  Arthritis, [ ]  Low back pain, [ ]  Joint pain Hematologic: [ ]  Easy Bruising, [ ]  Anemia; [ ]  Hepatitis Gastrointestinal: [ ]  Blood in stool, [ ]  Gastroesophageal Reflux/heartburn, [ ]  Trouble swallowing Urinary: [ ]  chronic Kidney disease, [ ]  on HD - [ ]  MWF or [ ]  TTHS, [ ]  Burning with urination, [ ]  Difficulty urinating Skin: [ ]  Rashes, [ ]  Wounds Psychological: [ ]  Anxiety, [ ]  Depression   Social History History  Substance Use Topics  . Smoking status: Never Smoker   . Smokeless tobacco: Not on file  . Alcohol Use: No    Family History History  reviewed. No pertinent family history.  Allergies  Allergen Reactions  . Cephalosporins Rash  . Amlodipine   . Anaprox (Naproxen Sodium)   . Benzonatate   . Cephalexin   . Clindamycin/Lincomycin   . Clorazepate   . Codeine   . Doxycycline   . Fexofenadine   . Hydralazine   . Labetalol   . Lisinopril   . Penicillins   . Phenylephrine   . Phenytoin   . Zolpidem     Current Outpatient Prescriptions  Medication Sig Dispense Refill  . Ascorbic Acid (VITAMIN C) 1000 MG tablet Take 1,000 mg by mouth daily.        . carvedilol (COREG) 6.25 MG tablet Take 6.25 mg by mouth 2 (two) times daily.      Marland Kitchen diltiazem (CARDIZEM CD) 240 MG 24 hr capsule Take 240 mg by mouth daily.        . Fish Oil-Cholecalciferol (OMEGA-3 FISH OIL/VITAMIN D3) 1000-1000 MG-UNIT CAPS Take by mouth.        . LevOCARNitine (L-CARNITINE) 250 MG TABS Take 2 tablets by mouth daily.        Marland Kitchen LORazepam (ATIVAN) 1 MG tablet Take 1 mg by mouth as directed.        . magnesium gluconate (MAGONATE) 500 MG tablet Take 500 mg by mouth 2 (two)  times daily.        . multivitamin (THERAGRAN) per tablet Take 1 tablet by mouth daily.        . nitroGLYCERIN (NITROSTAT) 0.4 MG SL tablet Place 0.4 mg under the tongue every 5 (five) minutes as needed.        . Potassium 99 MG TABS Take 1 tablet by mouth daily.        Marland Kitchen DISCONTD: carvedilol (COREG) 6.25 MG tablet Take 1 tablet (6.25 mg total) by mouth 2 (two) times daily.  60 tablet  6  . acetaminophen (TYLENOL) 325 MG tablet Take 650 mg by mouth every 6 (six) hours as needed.        . Omega-3 Fatty Acids (FISH OIL) 1000 MG CAPS Take 1 capsule by mouth daily.        . polyethylene glycol powder (MIRALAX) powder Take 17 g by mouth as needed.          Physical Examination  Filed Vitals:   12/25/11 1144  BP: 152/78  Pulse: 54  Resp: 16    Body mass index is 15.14 kg/(m^2).  General:  WDWN in NAD Gait: Normal HEENT: WNL Eyes: Pupils equal Pulmonary: normal non-labored  breathing , without Rales, rhonchi,  wheezing Cardiac: RRR, without  Murmurs, rubs or gallops; No carotid bruits Abdomen: soft, NT, no masses Skin: no rashes, ulcers noted Vascular Exam/Pulses: 2+ femoral pulses bilaterally, lower extremity pulses are palpable on the left but not the right, both feet are perfused well  Extremities without ischemic changes, no Gangrene , no cellulitis; no open wounds;  Musculoskeletal: no muscle wasting or atrophy  Neurologic: A&O X 3; Appropriate Affect ; SENSATION: normal; MOTOR FUNCTION:  moving all extremities equally. Speech is fluent/normal  Non-Invasive Vascular Imaging: ABIs today are 0.38 and monophasic on the right, 0.5 and monophasic on the left this was reviewed with Dr. Arbie Cookey who states the patient should continue medical management without intervention at this time. Lower extremity arterial duplex shows elevated velocity of 360 at the PFA which was also reviewed by Dr. Arbie Cookey. Bypass graft evaluation does show a patent right graft with some elevation in velocities.  ASSESSMENT/PLAN: Asymptomatic patient with a history of peripheral vascular disease that should be managed medically per Dr. Arbie Cookey, the patient will followup in one year with repeat graft duplex as well as ABIs. Her questions were encouraged and answered, she is in agreement with this plan.  Lauree Chandler ANP  Clinic M.D.: [place]

## 2011-12-25 NOTE — Addendum Note (Signed)
Addended by: Sharee Pimple on: 12/25/2011 02:35 PM   Modules accepted: Orders, Level of Service

## 2011-12-31 NOTE — Procedures (Unsigned)
BYPASS GRAFT EVALUATION  INDICATION:  Followup right external iliac to SFA graft.  HISTORY: Diabetes:  Known occlusion of right SFA Diabetic:  No Cardiac:  Yes Hypertension:  Yes Smoking:  No Previous Surgery:  Right EIA to SFA graft with transposition of PFA 11/19/2007  SINGLE LEVEL ARTERIAL EXAM                              RIGHT              LEFT Brachial: Anterior tibial: Posterior tibial: Peroneal: Ankle/brachial index:  TOE BRACHIAL INDEX RIGHT:  0.38  TOE BRACHIAL INDEX LEFT:  0.59  PREVIOUS ABI:  Date:  12/18/2010  RIGHT:  Inaccurate  LEFT:  Inaccurate  LOWER EXTREMITY BYPASS GRAFT DUPLEX EXAM:  DUPLEX:  Patent right external iliac to superficial femoral arterial graft feeding the profunda artery which occludes at the SFA origin. There are elevated velocities with disease present at the profunda origin. Waveforms are monophasic throughout the graft and the profunda artery which may indicate more proximal stenosis. Please see attached diagram for details.  IMPRESSION: 1. Patent proximal portion of external iliac to superficial femoral     arterial graft which occludes at the superficial femoral origin. 2. Stenosis is observed at the profunda origin which has been     transplanted into the graft.  ___________________________________________ Quita Skye. Hart Rochester, M.D.  LT/MEDQ  D:  12/25/2011  T:  12/25/2011  Job:  [id number]

## 2012-03-24 ENCOUNTER — Ambulatory Visit (INDEPENDENT_AMBULATORY_CARE_PROVIDER_SITE_OTHER): Payer: Medicare Other | Admitting: Internal Medicine

## 2012-03-24 ENCOUNTER — Encounter: Payer: Self-pay | Admitting: Internal Medicine

## 2012-03-24 VITALS — BP 203/104 | HR 79 | Ht 64.0 in | Wt 82.4 lb

## 2012-03-24 DIAGNOSIS — I10 Essential (primary) hypertension: Secondary | ICD-10-CM

## 2012-03-24 DIAGNOSIS — I2510 Atherosclerotic heart disease of native coronary artery without angina pectoris: Secondary | ICD-10-CM

## 2012-03-24 MED ORDER — CARVEDILOL 6.25 MG PO TABS: 12.5000 mg | ORAL_TABLET | Freq: Two times a day (BID) | ORAL | Status: DC

## 2012-03-24 NOTE — Progress Notes (Signed)
HPI Michelle Gaines returns today for followup. She is a very pleasant 78 year old woman with a history of hypertension, coronary disease, and atrial flutter, status post catheter ablation. In the interim, she denies chest pain, shortness of breath, syncope, or peripheral edema. She states at home her blood pressure is better than it is here today. She denies any vision changes, headache, or other symptoms associated with uncontrolled high blood pressure. Allergies  Allergen Reactions  . Cephalosporins Rash  . Amlodipine   . Anaprox (Naproxen Sodium)   . Benzonatate   . Cephalexin   . Clindamycin/Lincomycin   . Clorazepate   . Codeine   . Doxycycline   . Fexofenadine   . Hydralazine   . Labetalol   . Lisinopril   . Penicillins   . Phenylephrine   . Phenytoin   . Zolpidem      Current Outpatient Prescriptions  Medication Sig Dispense Refill  . Ascorbic Acid (VITAMIN C) 1000 MG tablet Take 1,000 mg by mouth daily.        . carvedilol (COREG) 6.25 MG tablet Take 6.25 mg by mouth 2 (two) times daily.      Marland Kitchen diltiazem (CARDIZEM CD) 240 MG 24 hr capsule Take 240 mg by mouth daily.        . Fish Oil-Cholecalciferol (OMEGA-3 FISH OIL/VITAMIN D3) 1000-1000 MG-UNIT CAPS Take by mouth.        . LevOCARNitine (L-CARNITINE) 250 MG TABS Take 2 tablets by mouth daily.        Marland Kitchen LORazepam (ATIVAN) 1 MG tablet Take 1 mg by mouth as directed.        . magnesium gluconate (MAGONATE) 500 MG tablet Take 500 mg by mouth 2 (two) times daily.        . multivitamin (THERAGRAN) per tablet Take 1 tablet by mouth daily.        . nitroGLYCERIN (NITROSTAT) 0.4 MG SL tablet Place 0.4 mg under the tongue every 5 (five) minutes as needed.        . Omega-3 Fatty Acids (FISH OIL) 1000 MG CAPS Take 1 capsule by mouth daily.        . polyethylene glycol powder (MIRALAX) powder Take 17 g by mouth as needed.        . Potassium 99 MG TABS Take 1 tablet by mouth daily.           Past Medical History  Diagnosis Date    . Hypertension   . Coronary artery disease   . PVD (peripheral vascular disease)   . Peripheral arterial disease     ROS:   All systems reviewed and negative except as noted in the HPI.   Past Surgical History  Procedure Date  . Bpg   . Pr vein bypass graft,aorto-fem-pop     Right   . Gliayte catherter insertion 06/27/10  . Carotid stent 11/30/09    LAD S/P Right CA DES     No family history on file.   History   Social History  . Marital Status: Married    Spouse Name: N/A    Number of Children: N/A  . Years of Education: N/A   Occupational History  . Not on file.   Social History Main Topics  . Smoking status: Never Smoker   . Smokeless tobacco: Not on file  . Alcohol Use: No  . Drug Use: No  . Sexually Active:    Other Topics Concern  . Not on file   Social History Narrative  .  No narrative on file     BP 203/104  Pulse 79  Ht 5\' 4"  (1.626 m)  Wt 82 lb 6.4 oz (37.376 kg)  BMI 14.14 kg/m2  Physical Exam: Blood pressure 182/92 by me Well appearing elderly woman, NAD HEENT: Unremarkable Neck:  7 cm JVD, no thyromegally Lungs:  Clear with no wheezes, rales, or rhonchi. HEART:  Regular rate rhythm, no murmurs, no rubs, no clicks S4 gallop is present Abd:  soft, scaphoid, positive bowel sounds, no organomegally, no rebound, no guarding Ext:  2 plus pulses, no edema, no cyanosis, no clubbing Skin:  No rashes no nodules Neuro:  CN II through XII intact, motor grossly intact  EKG Normal sinus rhythm with left ventricular hypertrophy  Assess/Plan:

## 2012-03-24 NOTE — Patient Instructions (Addendum)
Your physician recommends that you schedule a follow-up appointment in: 4 WEEKS WITH DR. [name]  Your physician has recommended you make the following change in your medication: INCREASE CARVEDILOL TO (12.5 MG) TWICE A DAY BUT WHILE YOU HAVE THE (6.25 MG) TAKE TWO TABLETS TWICE A DAY.  GAVE A PAPER SCRIPT TODAY.

## 2012-03-24 NOTE — Assessment & Plan Note (Signed)
She denies anginal symptoms. She is encouraged to increase her physical activity. 

## 2012-03-24 NOTE — Assessment & Plan Note (Signed)
Her blood pressure is elevated today. She has no symptoms of vision change, headache, or heart failure. I've recommended that she increase her carvedilol to 12 1/2 mg twice daily. She is instructed to maintain a low sodium diet, and I will plan to see her back in a month. She will follow up with her primary physician, in 2 months. I've asked the patient to check her blood pressure every day and record it.

## 2012-03-24 NOTE — Assessment & Plan Note (Signed)
She has had no recurrent atrial arrhythmias and is maintaining sinus rhythm very nicely. No change in medical therapy.

## 2012-04-22 ENCOUNTER — Other Ambulatory Visit: Payer: Self-pay | Admitting: Cardiology

## 2012-04-22 ENCOUNTER — Telehealth: Payer: Self-pay | Admitting: Internal Medicine

## 2012-04-22 MED ORDER — NITROGLYCERIN 0.4 MG SL SUBL: 0.4000 mg | SUBLINGUAL_TABLET | SUBLINGUAL | Status: DC | PRN

## 2012-04-22 NOTE — Telephone Encounter (Signed)
Called and left message to let patient know that I sent her nitro refill into the [facility] on [address] if she had any questions that she could give me a call back in the [place] office at [phone].

## 2012-04-22 NOTE — Telephone Encounter (Signed)
New Problem:    Patient called in needing a refill of her nitroGLYCERIN (NITROSTAT) 0.4 MG SL tablet.

## 2012-04-24 ENCOUNTER — Encounter: Payer: Self-pay | Admitting: Internal Medicine

## 2012-04-24 ENCOUNTER — Ambulatory Visit (INDEPENDENT_AMBULATORY_CARE_PROVIDER_SITE_OTHER): Payer: Medicare Other | Admitting: Internal Medicine

## 2012-04-24 VITALS — BP 108/58 | HR 50 | Ht 64.0 in | Wt 90.0 lb

## 2012-04-24 DIAGNOSIS — R42 Dizziness and giddiness: Secondary | ICD-10-CM | POA: Insufficient documentation

## 2012-04-24 DIAGNOSIS — I10 Essential (primary) hypertension: Secondary | ICD-10-CM

## 2012-04-24 DIAGNOSIS — I2510 Atherosclerotic heart disease of native coronary artery without angina pectoris: Secondary | ICD-10-CM

## 2012-04-24 MED ORDER — CARVEDILOL 6.25 MG PO TABS: ORAL_TABLET | ORAL | Status: DC

## 2012-04-24 NOTE — Assessment & Plan Note (Signed)
We discussed the possible etiologies of her dizziness. Initially, I thought this was all related to hypotension. The patient states that her dizziness can occur when her blood pressure is elevated as well. She may need follow up with her medical doctor. It is typically with upright posture.

## 2012-04-24 NOTE — Patient Instructions (Signed)
Your physician recommends that you schedule a follow-up appointment in: months with Dr [name]  Your physician has recommended you make the following change in your medication:  1) Decrease Carvedilol to

## 2012-04-24 NOTE — Assessment & Plan Note (Signed)
Her blood pressure is improved but she does have an increase in dizziness. I've asked the patient to decrease her carvedilol in the morning to a half a tablet twice a day. As long as her systolic blood pressure is less than 140, she continue with this regimen. She will take a half tablet in the evening. If her blood pressure goes over 140, I've asked that she take the other half tablet of carvedilol.

## 2012-04-24 NOTE — Assessment & Plan Note (Signed)
She denies anginal symptoms. She will continue her current medical therapy. 

## 2012-04-24 NOTE — Progress Notes (Signed)
HPI [name] returns today for followup. She is an 78 year old woman with peripheral vascular disease and hypertension, who was seen by me several weeks ago. At that time her blood pressure was very high, over 200 mm mercury. We discontinued her metoprolol and started carvedilol 6.25 mg twice a day. In addition, she is been maintained on her Cardizem 240 mg daily. Today she brings her blood pressure checks in and I note that her systolic blood pressures have ranged between 100 and 140s mmHg. Despite this, she continues to have intermittent dizzy spells. She has not passed out and she has not fallen. She has innumerable questions pertaining to natural remedies to help control her blood pressure. She would like to start taking hawthorn berries. I've discussed this with the patient. While I have not per inhibitor her from taking natural supplements, I have asked that she continue to take her current medical therapy. Allergies  Allergen Reactions  . Cephalosporins Rash  . Amlodipine   . Anaprox (Naproxen Sodium)   . Benzonatate   . Cephalexin   . Clindamycin/Lincomycin   . Clorazepate   . Codeine   . Doxycycline   . Fexofenadine   . Hydralazine   . Labetalol   . Lisinopril   . Penicillins   . Phenylephrine   . Phenytoin   . Zolpidem      Current Outpatient Prescriptions  Medication Sig Dispense Refill  . Ascorbic Acid (VITAMIN C) 1000 MG tablet Take 1,000 mg by mouth daily.        . carvedilol (COREG) 6.25 MG tablet take 1/2 tablet in the morning and 1 tablet in the pm  60 tablet  6  . diltiazem (CARDIZEM CD) 240 MG 24 hr capsule Take 240 mg by mouth daily.        . Fish Oil-Cholecalciferol (OMEGA-3 FISH OIL/VITAMIN D3) 1000-1000 MG-UNIT CAPS Take by mouth.        . LevOCARNitine (L-CARNITINE) 250 MG TABS Take 2 tablets by mouth daily.        Marland Kitchen LORazepam (ATIVAN) 1 MG tablet Take 1 mg by mouth as directed.        . magnesium gluconate (MAGONATE) 500 MG tablet Take 500 mg by mouth 2  (two) times daily.        . multivitamin (THERAGRAN) per tablet Take 1 tablet by mouth daily.        . nitroGLYCERIN (NITROSTAT) 0.4 MG SL tablet Place 1 tablet (0.4 mg total) under the tongue every 5 (five) minutes as needed.  25 tablet  3  . Omega-3 Fatty Acids (FISH OIL) 1000 MG CAPS Take 1 capsule by mouth daily.        . polyethylene glycol powder (MIRALAX) powder Take 17 g by mouth as needed.        . Potassium 99 MG TABS Take 1 tablet by mouth daily.        Marland Kitchen DISCONTD: carvedilol (COREG) 6.25 MG tablet Take 2 tablets (12.5 mg total) by mouth 2 (two) times daily.  60 tablet  6     Past Medical History  Diagnosis Date  . Hypertension   . Coronary artery disease   . PVD (peripheral vascular disease)   . Peripheral arterial disease     ROS:   All systems reviewed and negative except as noted in the HPI.   Past Surgical History  Procedure Date  . Bpg   . Pr vein bypass graft,aorto-fem-pop     Right   . Layla Barter  catherter insertion 06/27/10  . Carotid stent 11/30/09    LAD S/P Right CA DES     No family history on file.   History   Social History  . Marital Status: Married    Spouse Name: N/A    Number of Children: N/A  . Years of Education: N/A   Occupational History  . Not on file.   Social History Main Topics  . Smoking status: Never Smoker   . Smokeless tobacco: Not on file  . Alcohol Use: No  . Drug Use: No  . Sexually Active:    Other Topics Concern  . Not on file   Social History Narrative  . No narrative on file     BP 108/58  Pulse 50  Ht 5\' 4"  (1.626 m)  Wt 90 lb (40.824 kg)  BMI 15.45 kg/m2  SpO2 98%  Physical Exam:  Well appearing elderly woman, NAD HEENT: Unremarkable Neck:  No JVD, no thyromegally Lungs:  Clear with no wheezes, rales, or rhonchi. HEART:  Regular rate rhythm, no murmurs, no rubs, no clicks Abd:  soft, positive bowel sounds, no organomegally, no rebound, no guarding Ext:  2 plus pulses, no edema, no cyanosis, no  clubbing Skin:  No rashes no nodules Neuro:  CN II through XII intact, motor grossly intact   Assess/Plan:

## 2012-08-19 ENCOUNTER — Ambulatory Visit: Payer: Medicare Other | Admitting: Internal Medicine

## 2012-08-20 ENCOUNTER — Encounter: Payer: Self-pay | Admitting: Internal Medicine

## 2012-08-20 ENCOUNTER — Ambulatory Visit (INDEPENDENT_AMBULATORY_CARE_PROVIDER_SITE_OTHER): Payer: Medicare Other | Admitting: Internal Medicine

## 2012-08-20 VITALS — BP 161/69 | HR 54 | Ht 62.0 in | Wt 90.2 lb

## 2012-08-20 DIAGNOSIS — R42 Dizziness and giddiness: Secondary | ICD-10-CM

## 2012-08-20 DIAGNOSIS — I10 Essential (primary) hypertension: Secondary | ICD-10-CM

## 2012-08-20 MED ORDER — DILTIAZEM HCL ER COATED BEADS 240 MG PO CP24: ORAL_CAPSULE | ORAL | Status: DC

## 2012-08-20 NOTE — Assessment & Plan Note (Signed)
Her current symptoms are well-controlled. I've recommended that she make or medication changes as previously directed. If her dizziness worsens, she is instructed to call us.

## 2012-08-20 NOTE — Assessment & Plan Note (Signed)
Her blood pressure at home appears to be well-controlled. I've encouraged the patient to continue her beta blocker but have allowed her to discontinue her Cardizem. I've instructed the patient to increase her carvedilol if her Cardizem results an elevation of her blood pressure. She will continue to maintain a low-sodium diet.

## 2012-08-20 NOTE — Patient Instructions (Addendum)
Decrease your Diltiazem to 1 tablet every other day for 2 weeks then discontinue.  If your systolic ( top number) blood pressure goes up above 150. Then increase your Carvedilol (Coreg) to 1 tablet twice a day  Your physician recommends that you schedule a follow-up appointment in: 6 months with Dr. [name]

## 2012-08-20 NOTE — Progress Notes (Signed)
HPI Michelle Gaines returns today for followup. She is a very pleasant 79 year old woman with a history of hypertension and dizziness, and a history of noncompliance. When I saw the patient last several months ago, she was having ongoing dizziness. At that time, her dizziness did not appear to correspond to hypotension or hypertension, but rather appear to be present for no obvious reason. The patient's blood pressure has at times been difficult to control. Today she returns noting that her dizziness is improved but still present. She wonders if it is not related to her diltiazem. Serial blood pressures demonstrates that her blood pressure appears to be well-controlled. She has had no frank syncope but does note occasional palpitations and mild peripheral edema. She continues to inquire about whether Hawthorn berries might help control her blood pressure. Allergies  Allergen Reactions  . Cephalosporins Rash  . Amlodipine   . Anaprox (Naproxen Sodium)   . Benzonatate   . Cephalexin   . Clindamycin/Lincomycin   . Clorazepate   . Codeine   . Doxycycline   . Fexofenadine   . Hydralazine   . Labetalol   . Lisinopril   . Penicillins   . Phenylephrine   . Phenytoin   . Zolpidem      Current Outpatient Prescriptions  Medication Sig Dispense Refill  . Ascorbic Acid (VITAMIN C) 1000 MG tablet Take 1,000 mg by mouth daily.        . carvedilol (COREG) 6.25 MG tablet take 1/2 tablet in the morning and 1 tablet in the pm  60 tablet  6  . diltiazem (CARDIZEM CD) 240 MG 24 hr capsule Decrease Diltiazem to 1 tablet every other day for 2 weeks. Then discontinue      . Fish Oil-Cholecalciferol (OMEGA-3 FISH OIL/VITAMIN D3) 1000-1000 MG-UNIT CAPS Take by mouth.        . LevOCARNitine (L-CARNITINE) 250 MG TABS Take 2 tablets by mouth daily.        Marland Kitchen LORazepam (ATIVAN) 1 MG tablet Take 1 mg by mouth as directed.        . magnesium gluconate (MAGONATE) 500 MG tablet Take 500 mg by mouth 2 (two) times daily.         . multivitamin (THERAGRAN) per tablet Take 1 tablet by mouth daily.        . nitroGLYCERIN (NITROSTAT) 0.4 MG SL tablet Place 1 tablet (0.4 mg total) under the tongue every 5 (five) minutes as needed.  25 tablet  3  . Omega-3 Fatty Acids (FISH OIL) 1000 MG CAPS Take 1 capsule by mouth daily.        . polyethylene glycol powder (MIRALAX) powder Take 17 g by mouth as needed.        . Potassium 99 MG TABS Take 1 tablet by mouth daily.         No current facility-administered medications for this visit.     Past Medical History  Diagnosis Date  . Hypertension   . Coronary artery disease   . PVD (peripheral vascular disease)   . Peripheral arterial disease     ROS:   All systems reviewed and negative except as noted in the HPI.   Past Surgical History  Procedure Laterality Date  . Bpg    . Pr vein bypass graft,aorto-fem-pop      Right   . Gliayte catherter insertion  06/27/10  . Carotid stent  11/30/09    LAD S/P Right CA DES     History reviewed. No pertinent  family history.   History   Social History  . Marital Status: Married    Spouse Name: N/A    Number of Children: N/A  . Years of Education: N/A   Occupational History  . Not on file.   Social History Main Topics  . Smoking status: Never Smoker   . Smokeless tobacco: Not on file  . Alcohol Use: No  . Drug Use: No  . Sexually Active:    Other Topics Concern  . Not on file   Social History Narrative  . No narrative on file     BP 161/69  Pulse 54  Ht 5\' 2"  (1.575 m)  Wt 90 lb 3.2 oz (40.914 kg)  BMI 16.49 kg/m2  Physical Exam:  frail appearing 79 year old woman,NAD HEENT: Unremarkable Neck:  No JVD, no thyromegally Lungs:  Clear with no wheezes, rales, or rhonchi. HEART:  Regular rate rhythm, no murmurs, no rubs, no clicks Abd:  soft, positive bowel sounds, no organomegally, no rebound, no guarding Ext:  2 plus pulses, no edema, no cyanosis, no clubbing Skin:  No rashes no nodules Neuro:   CN II through XII intact, motor grossly intact   Assess/Plan:

## 2012-09-10 ENCOUNTER — Other Ambulatory Visit: Payer: Self-pay

## 2012-09-10 DIAGNOSIS — I10 Essential (primary) hypertension: Secondary | ICD-10-CM

## 2012-09-10 DIAGNOSIS — I2510 Atherosclerotic heart disease of native coronary artery without angina pectoris: Secondary | ICD-10-CM

## 2012-09-10 MED ORDER — CARVEDILOL 6.25 MG PO TABS: ORAL_TABLET | ORAL | Status: DC

## 2012-09-11 ENCOUNTER — Other Ambulatory Visit: Payer: Self-pay | Admitting: *Deleted

## 2012-09-11 DIAGNOSIS — I2510 Atherosclerotic heart disease of native coronary artery without angina pectoris: Secondary | ICD-10-CM

## 2012-09-11 DIAGNOSIS — I10 Essential (primary) hypertension: Secondary | ICD-10-CM

## 2012-09-11 NOTE — Telephone Encounter (Signed)
Pt called wanting office to send in her Coreg to her pharmacy. Pt states she would like to get 360 tablets at a time due to her not wanting to keep having to go to pharmacy. Pt also states Dr. Chilton Si filled it once for her for 360 tablets before and informed her that Dr. Lubertha Basque office would do the same. Informed pt that we are only allowed to give her 180 tablets at a time. While looking through pt chart, noticed pt instructions had two different SIGs. Asked pt how she was taking her Coreg and she states (Coreg 6.25mg ; 0.5 tablet in A.M and 1 Tablet in P.M). Informed pt that I am going to talk to Dr. Lubertha Basque nurse tomorrow (09-12-12) about this and will call her back. Pt agreed. Informed pt if she does not here from [place] to call office back.

## 2012-09-12 ENCOUNTER — Other Ambulatory Visit: Payer: Self-pay | Admitting: Internal Medicine

## 2012-09-12 MED ORDER — CARVEDILOL 6.25 MG PO TABS: ORAL_TABLET | ORAL | Status: DC

## 2012-09-12 NOTE — Addendum Note (Signed)
Addended by: Dennis Bast F on: 09/12/2012 08:15 AM   Modules accepted: Orders

## 2012-09-12 NOTE — Telephone Encounter (Signed)
Pt needs a refill on Carvedilol 90 day supply

## 2012-09-12 NOTE — Telephone Encounter (Signed)
Spoke with patient and let her know that I can only give 180 tablets.  She was fine with this and will give her refills.  I let her know if Dr Chilton Si wanted to give her 360 tablets then she could call him and get him to refill the medication that way but I can not do that

## 2012-12-29 ENCOUNTER — Encounter: Payer: Self-pay | Admitting: Vascular Surgery

## 2012-12-30 ENCOUNTER — Encounter (INDEPENDENT_AMBULATORY_CARE_PROVIDER_SITE_OTHER): Payer: Medicare Other | Admitting: *Deleted

## 2012-12-30 ENCOUNTER — Ambulatory Visit (INDEPENDENT_AMBULATORY_CARE_PROVIDER_SITE_OTHER): Payer: Medicare Other | Admitting: Vascular Surgery

## 2012-12-30 ENCOUNTER — Encounter: Payer: Self-pay | Admitting: Vascular Surgery

## 2012-12-30 VITALS — BP 187/115 | HR 72 | Ht 62.0 in | Wt 84.5 lb

## 2012-12-30 DIAGNOSIS — I739 Peripheral vascular disease, unspecified: Secondary | ICD-10-CM

## 2012-12-30 DIAGNOSIS — Z48812 Encounter for surgical aftercare following surgery on the circulatory system: Secondary | ICD-10-CM

## 2012-12-30 NOTE — Progress Notes (Signed)
Subjective:     Patient ID: Michelle Gaines, female   DOB: 1929-02-08, 79 y.o.   MRN: [id number]  HPI this 79 year old female is status post right external iliac to superficial femoral artery bypass with extensive endarterectomy and reimplantation of right profunda femoris artery in the graft. This was performed in 2009. She has had no significant claudication symptoms since then. She had significant ischemia preoperatively. She is able to ambulate 1/2 mile without much discomfort.  Past Medical History  Diagnosis Date  . Hypertension   . Coronary artery disease   . PVD (peripheral vascular disease)   . Peripheral arterial disease     History  Substance Use Topics  . Smoking status: Never Smoker   . Smokeless tobacco: Not on file  . Alcohol Use: No    History reviewed. No pertinent family history.  Allergies  Allergen Reactions  . Cephalosporins Rash  . Amlodipine   . Anaprox (Naproxen Sodium)   . Benzonatate   . Cephalexin   . Clindamycin/Lincomycin   . Clorazepate   . Codeine   . Doxycycline   . Fexofenadine   . Hydralazine   . Labetalol   . Lisinopril   . Penicillins   . Phenylephrine   . Phenytoin   . Zolpidem     Current outpatient prescriptions:Ascorbic Acid (VITAMIN C) 1000 MG tablet, Take 1,000 mg by mouth daily.  , Disp: , Rfl: ;  carvedilol (COREG) 6.25 MG tablet, take 1/2 tablet in the morning and 1 tablet in the pm if systolic BP is >150 take 1 in the morning and 1 at night, Disp: 180 tablet, Rfl: 3;  Flaxseed, Linseed, (FLAX SEED OIL PO), Take 1 tablet by mouth daily., Disp: , Rfl:  LevOCARNitine (L-CARNITINE) 250 MG TABS, Take 2 tablets by mouth daily.  , Disp: , Rfl: ;  LORazepam (ATIVAN) 1 MG tablet, Take 1 mg by mouth as directed.  , Disp: , Rfl: ;  magnesium gluconate (MAGONATE) 500 MG tablet, Take 500 mg by mouth 2 (two) times daily.  , Disp: , Rfl: ;  multivitamin (THERAGRAN) per tablet, Take 1 tablet by mouth daily.  , Disp: , Rfl:  nitroGLYCERIN  (NITROSTAT) 0.4 MG SL tablet, Place 1 tablet (0.4 mg total) under the tongue every 5 (five) minutes as needed., Disp: 25 tablet, Rfl: 3;  polyethylene glycol powder (MIRALAX) powder, Take 17 g by mouth as needed.  , Disp: , Rfl: ;  Potassium 99 MG TABS, Take 1 tablet by mouth daily.  , Disp: , Rfl:  diltiazem (CARDIZEM CD) 240 MG 24 hr capsule, Decrease Diltiazem to 1 tablet every other day for 2 weeks. Then discontinue, Disp: , Rfl: ;  Fish Oil-Cholecalciferol (OMEGA-3 FISH OIL/VITAMIN D3) 1000-1000 MG-UNIT CAPS, Take by mouth.  , Disp: , Rfl: ;  Omega-3 Fatty Acids (FISH OIL) 1000 MG CAPS, Take 1 capsule by mouth daily.  , Disp: , Rfl:   BP 187/115  Pulse 72  Ht 5\' 2"  (1.575 m)  Wt 84 lb 8 oz (38.329 kg)  BMI 15.45 kg/m2  SpO2 100%  Body mass index is 15.45 kg/(m^2).           Review of Systems denies chest pain, dyspnea on exertion, PND, orthopnea, hemoptysis, lateralizing weakness, aphasia, amaurosis fugax, diplopia, blurred vision, and syncope. All systems negative and complete review of systems     Objective:   Physical Exam blood pressure 187/115 heart rate 72 respirations 16 Gen.-alert and oriented x3 in no apparent distress  HEENT normal for age Lungs no rhonchi or wheezing Cardiovascular regular rhythm no murmurs carotid pulses 3+ palpable no bruits audible Abdomen soft nontender no palpable masses Musculoskeletal free of  major deformities Skin clear -no rashes Neurologic normal Lower extremities 3+ femoral and 1-2+ popliteal pulse palpable on the right with no pedal pulses palpable. Left leg has 3+ femoral and absent distal pulses.  Data ordered lower extremity duplex scan of the right inguinal bypass and ABIs. ABI on the right leg is 0.42 which is unchanged from previous study. The graft from the right external iliac to profunda femoris artery is patent but the superficial femoral artery is chronically occluded.      Assessment:     Patent right external iliac to  profunda femoris bypass with occlusion of right superficial femoral artery-stable ABI 0.42-no significant claudication symptoms    Plan:     Return in one year for followup ABIs and duplex scan of right external iliac to profunda femoris bypass and seeing nurse practitioner Will notify us sooner if symptoms reoccur

## 2012-12-30 NOTE — Addendum Note (Signed)
Addended by: Adria Dill L on: 12/30/2012 03:43 PM   Modules accepted: Orders

## 2013-02-17 ENCOUNTER — Encounter: Payer: Self-pay | Admitting: Internal Medicine

## 2013-02-17 ENCOUNTER — Ambulatory Visit (INDEPENDENT_AMBULATORY_CARE_PROVIDER_SITE_OTHER): Payer: Medicare Other | Admitting: Internal Medicine

## 2013-02-17 VITALS — BP 162/85 | HR 73 | Ht 62.0 in | Wt 86.6 lb

## 2013-02-17 DIAGNOSIS — I10 Essential (primary) hypertension: Secondary | ICD-10-CM

## 2013-02-17 DIAGNOSIS — R42 Dizziness and giddiness: Secondary | ICD-10-CM

## 2013-02-17 NOTE — Patient Instructions (Addendum)
Your physician wants you to follow-up in: ONE YEAR WITH DR TAYLOR You will receive a reminder letter in the mail two months in advance. If you don't receive a letter, please call our office to schedule the follow-up appointment.  

## 2013-02-17 NOTE — Progress Notes (Signed)
HPI [name] returns today for followup. The patient is a very pleasant 79 year old woman, with a history of hypertension, peripheral vascular disease, and palpitations. She has had trouble with dizziness. In the doctor's office, her blood pressure is typically high. Today she brings in her blood pressure recordings from home and I note her blood pressure is fairly well controlled, down approximately 30-40 points from her blood pressure here in the office.  We stop her Cardizem, her dizziness improved markedly. She has had no syncope. She admits to some sodium indiscretion.  Allergies  Allergen Reactions  . Cephalosporins Rash  . Amlodipine   . Anaprox [Naproxen Sodium]   . Benzonatate   . Cephalexin   . Clindamycin/Lincomycin   . Clorazepate   . Codeine   . Doxycycline   . Fexofenadine   . Hydralazine   . Labetalol   . Lisinopril   . Penicillins   . Phenylephrine   . Phenytoin   . Zolpidem      Current Outpatient Prescriptions  Medication Sig Dispense Refill  . Ascorbic Acid (VITAMIN C) 1000 MG tablet Take 1,000 mg by mouth daily.        . carvedilol (COREG) 6.25 MG tablet take 1/2 tablet in the morning and 1 tablet in the pm if systolic BP is >150 take 1 in the morning and 1 at night  180 tablet  3  . Flaxseed, Linseed, (FLAX SEED OIL PO) Take 1 tablet by mouth daily.      . LevOCARNitine (L-CARNITINE) 250 MG TABS Take 2 tablets by mouth daily.        Marland Kitchen LORazepam (ATIVAN) 1 MG tablet Take 1 mg by mouth as directed.        . magnesium gluconate (MAGONATE) 500 MG tablet Take 500 mg by mouth 2 (two) times daily.        . multivitamin (THERAGRAN) per tablet Take 1 tablet by mouth daily.        . nitroGLYCERIN (NITROSTAT) 0.4 MG SL tablet Place 1 tablet (0.4 mg total) under the tongue every 5 (five) minutes as needed.  25 tablet  3  . polyethylene glycol powder (MIRALAX) powder Take 17 g by mouth as needed.        . Potassium 99 MG TABS Take 1 tablet by mouth daily.        Marland Kitchen  VITAMIN D, CHOLECALCIFEROL, PO Take 1,000 mg by mouth 2 (two) times daily.       No current facility-administered medications for this visit.     Past Medical History  Diagnosis Date  . Hypertension   . Coronary artery disease   . PVD (peripheral vascular disease)   . Peripheral arterial disease     ROS:   All systems reviewed and negative except as noted in the HPI.   Past Surgical History  Procedure Laterality Date  . Bpg    . Pr vein bypass graft,aorto-fem-pop      Right   . Gliayte catherter insertion  06/27/10  . Carotid stent  11/30/09    LAD S/P Right CA DES     History reviewed. No pertinent family history.   History   Social History  . Marital Status: Married    Spouse Name: N/A    Number of Children: N/A  . Years of Education: N/A   Occupational History  . Not on file.   Social History Main Topics  . Smoking status: Never Smoker   . Smokeless tobacco: Not on file  .  Alcohol Use: No  . Drug Use: No  . Sexual Activity:    Other Topics Concern  . Not on file   Social History Narrative  . No narrative on file     BP 162/85  Pulse 73  Ht 5\' 2"  (1.575 m)  Wt 86 lb 9.6 oz (39.282 kg)  BMI 15.84 kg/m2  SpO2 98%  Physical Exam:   frail appearing elderly woman, NAD HEENT: Unremarkable Neck:  7 cm JVD, no thyromegally Back:  No CVA tenderness Lungs:  Clear with no wheezes, rales, or rhonchi. HEART:  Regular rate rhythm, no murmurs, no rubs, no clicks, S4 gallop is present Abd:  soft, scaphoid, positive bowel sounds, no organomegally, no rebound, no guarding Ext:  2 plus pulses, no edema, no cyanosis, no clubbing Skin:  No rashes no nodules Neuro:  CN II through XII intact, motor grossly intact   DEVICE  Normal device function.  See PaceArt for details.   Assess/Plan:

## 2013-02-17 NOTE — Assessment & Plan Note (Signed)
Her blood pressure is elevated today in the office but 30 points lower on average at home. No change in medical therapy. She is encouraged to maintain a low-sodium diet.

## 2013-02-17 NOTE — Assessment & Plan Note (Signed)
Her symptoms are much improved. She will continue her current medical therapy.

## 2013-05-20 ENCOUNTER — Inpatient Hospital Stay (HOSPITAL_COMMUNITY): Payer: Medicare Other

## 2013-05-20 ENCOUNTER — Ambulatory Visit (INDEPENDENT_AMBULATORY_CARE_PROVIDER_SITE_OTHER): Payer: Medicare Other | Admitting: Cardiovascular Disease

## 2013-05-20 ENCOUNTER — Inpatient Hospital Stay (HOSPITAL_COMMUNITY)
Admission: AD | Admit: 2013-05-20 | Discharge: 2013-05-23 | DRG: 251 | Disposition: A | Discharge status: Home / Self Care | Payer: Medicare Other | Source: Ambulatory Visit | Attending: Cardiovascular Disease | Admitting: Cardiovascular Disease

## 2013-05-20 ENCOUNTER — Encounter: Payer: Self-pay | Admitting: Cardiovascular Disease

## 2013-05-20 ENCOUNTER — Encounter (HOSPITAL_COMMUNITY): Payer: Self-pay | Admitting: General Practice

## 2013-05-20 VITALS — BP 156/90 | HR 109 | Ht 62.0 in | Wt 84.0 lb

## 2013-05-20 DIAGNOSIS — R079 Chest pain, unspecified: Secondary | ICD-10-CM

## 2013-05-20 DIAGNOSIS — I10 Essential (primary) hypertension: Secondary | ICD-10-CM | POA: Diagnosis present

## 2013-05-20 DIAGNOSIS — I4892 Unspecified atrial flutter: Secondary | ICD-10-CM

## 2013-05-20 DIAGNOSIS — R262 Difficulty in walking, not elsewhere classified: Secondary | ICD-10-CM | POA: Diagnosis present

## 2013-05-20 DIAGNOSIS — I739 Peripheral vascular disease, unspecified: Secondary | ICD-10-CM | POA: Diagnosis present

## 2013-05-20 DIAGNOSIS — R42 Dizziness and giddiness: Secondary | ICD-10-CM

## 2013-05-20 DIAGNOSIS — I498 Other specified cardiac arrhythmias: Secondary | ICD-10-CM | POA: Diagnosis present

## 2013-05-20 DIAGNOSIS — I2510 Atherosclerotic heart disease of native coronary artery without angina pectoris: Secondary | ICD-10-CM | POA: Diagnosis present

## 2013-05-20 DIAGNOSIS — Z79899 Other long term (current) drug therapy: Secondary | ICD-10-CM

## 2013-05-20 DIAGNOSIS — K219 Gastro-esophageal reflux disease without esophagitis: Secondary | ICD-10-CM | POA: Diagnosis present

## 2013-05-20 HISTORY — DX: Unspecified osteoarthritis, unspecified site: M19.90

## 2013-05-20 HISTORY — DX: Cardiac arrhythmia, unspecified: I49.9

## 2013-05-20 HISTORY — DX: Malignant (primary) neoplasm, unspecified: C80.1

## 2013-05-20 HISTORY — DX: Gastro-esophageal reflux disease without esophagitis: K21.9

## 2013-05-20 LAB — CBC WITH DIFFERENTIAL/PLATELET
Eosinophils Absolute: 0 10*3/uL (ref 0.0–0.7)
HCT: 42.7 % (ref 36.0–46.0)
Hemoglobin: 14.3 g/dL (ref 12.0–15.0)
Lymphs Abs: 0.7 10*3/uL (ref 0.7–4.0)
MCH: 33.4 pg (ref 26.0–34.0)
MCHC: 33.5 g/dL (ref 30.0–36.0)
MCV: 99.8 fL (ref 78.0–100.0)
Monocytes Absolute: 0.2 10*3/uL (ref 0.1–1.0)
Monocytes Relative: 3 % (ref 3–12)
Neutro Abs: 5.5 10*3/uL (ref 1.7–7.7)
Neutrophils Relative %: 86 % — ABNORMAL HIGH (ref 43–77)
RBC: 4.28 MIL/uL (ref 3.87–5.11)

## 2013-05-20 LAB — COMPREHENSIVE METABOLIC PANEL
ALT: 22 U/L (ref 0–35)
Alkaline Phosphatase: 78 U/L (ref 39–117)
BUN: 21 mg/dL (ref 6–23)
CO2: 26 mEq/L (ref 19–32)
Calcium: 9.5 mg/dL (ref 8.4–10.5)
GFR calc Af Amer: 73 mL/min — ABNORMAL LOW (ref >90)
GFR calc non Af Amer: 63 mL/min — ABNORMAL LOW (ref >90)
Glucose, Bld: 171 mg/dL — ABNORMAL HIGH (ref 70–99)
Potassium: 3.4 mEq/L — ABNORMAL LOW (ref 3.5–5.1)
Sodium: 140 mEq/L (ref 135–145)

## 2013-05-20 LAB — PRO B NATRIURETIC PEPTIDE: Pro B Natriuretic peptide (BNP): 3052 pg/mL — ABNORMAL HIGH (ref 0–450)

## 2013-05-20 LAB — APTT: aPTT: 35 seconds (ref 24–37)

## 2013-05-20 LAB — TROPONIN I
Troponin I: <0.3 ng/mL (ref <0.30)
Troponin I: <0.3 ng/mL (ref <0.30)

## 2013-05-20 LAB — PROTIME-INR: INR: 1.01 (ref 0.00–1.49)

## 2013-05-20 LAB — MAGNESIUM: Magnesium: 2.2 mg/dL (ref 1.5–2.5)

## 2013-05-20 MED ORDER — MAGNESIUM OXIDE 400 (241.3 MG) MG PO TABS
400.0000 mg | ORAL_TABLET | Freq: Two times a day (BID) | ORAL | Status: DC
  Administered 2013-05-20 – 2013-05-23 (×6): 400 mg via ORAL
  Filled 2013-05-20 (×8): qty 1

## 2013-05-20 MED ORDER — ASPIRIN EC 81 MG PO TBEC
81.0000 mg | DELAYED_RELEASE_TABLET | Freq: Every day | ORAL | Status: DC
  Administered 2013-05-21 – 2013-05-23 (×3): 81 mg via ORAL
  Filled 2013-05-20 (×3): qty 1

## 2013-05-20 MED ORDER — HEPARIN BOLUS VIA INFUSION
2000.0000 [IU] | Freq: Once | INTRAVENOUS | Status: AC
  Administered 2013-05-20: 2000 [IU] via INTRAVENOUS
  Filled 2013-05-20: qty 2000

## 2013-05-20 MED ORDER — ACETAMINOPHEN 325 MG PO TABS: 650.0000 mg | ORAL_TABLET | ORAL | Status: DC | PRN

## 2013-05-20 MED ORDER — MULTIVITAMINS PO TABS: 1.0000 | ORAL_TABLET | Freq: Every day | ORAL | Status: DC

## 2013-05-20 MED ORDER — SODIUM CHLORIDE 0.9 % IJ SOLN
3.0000 mL | Freq: Two times a day (BID) | INTRAMUSCULAR | Status: DC
  Administered 2013-05-20 – 2013-05-22 (×3): 3 mL via INTRAVENOUS

## 2013-05-20 MED ORDER — CARVEDILOL 3.125 MG PO TABS
3.1250 mg | ORAL_TABLET | Freq: Two times a day (BID) | ORAL | Status: DC
  Administered 2013-05-20 – 2013-05-23 (×6): 3.125 mg via ORAL
  Filled 2013-05-20 (×8): qty 1

## 2013-05-20 MED ORDER — SODIUM CHLORIDE 0.9 % IV SOLN: 250.0000 mL | INTRAVENOUS | Status: DC | PRN

## 2013-05-20 MED ORDER — NITROGLYCERIN 0.4 MG SL SUBL: 0.4000 mg | SUBLINGUAL_TABLET | SUBLINGUAL | Status: DC | PRN

## 2013-05-20 MED ORDER — MAGNESIUM GLUCONATE 500 MG PO TABS: 500.0000 mg | ORAL_TABLET | Freq: Two times a day (BID) | ORAL | Status: DC

## 2013-05-20 MED ORDER — POTASSIUM 99 MG PO TABS: 1.0000 | ORAL_TABLET | Freq: Every day | ORAL | Status: DC

## 2013-05-20 MED ORDER — LORAZEPAM 0.5 MG PO TABS
0.5000 mg | ORAL_TABLET | Freq: Every day | ORAL | Status: AC
  Administered 2013-05-20: 0.5 mg via ORAL
  Filled 2013-05-20: qty 1

## 2013-05-20 MED ORDER — ONDANSETRON HCL 4 MG/2ML IJ SOLN: 4.0000 mg | Freq: Four times a day (QID) | INTRAMUSCULAR | Status: DC | PRN

## 2013-05-20 MED ORDER — HEPARIN (PORCINE) IN NACL 100-0.45 UNIT/ML-% IJ SOLN
700.0000 [IU]/h | INTRAMUSCULAR | Status: DC
  Administered 2013-05-20: 550 [IU]/h via INTRAVENOUS
  Administered 2013-05-21: 700 [IU]/h via INTRAVENOUS
  Filled 2013-05-20 (×3): qty 250

## 2013-05-20 MED ORDER — SODIUM CHLORIDE 0.9 % IJ SOLN: 3.0000 mL | INTRAMUSCULAR | Status: DC | PRN

## 2013-05-20 MED ORDER — LORAZEPAM 0.5 MG PO TABS: 0.5000 mg | ORAL_TABLET | ORAL | Status: DC

## 2013-05-20 MED ORDER — ADULT MULTIVITAMIN W/MINERALS CH
1.0000 | ORAL_TABLET | Freq: Every day | ORAL | Status: DC
  Administered 2013-05-21 – 2013-05-23 (×3): 1 via ORAL
  Filled 2013-05-20 (×4): qty 1

## 2013-05-20 NOTE — Progress Notes (Signed)
History and Physical  Patient ID: ASYA DERRYBERRY MRN: [id number], SOB: [birth year] 79 y.o. Date of Encounter: 05/20/2013, 1:27 PM  Primary Physician: Enrique Sack, MD Primary Cardiologist: Dr [name]  Chief Complaint: Chest pain, dizziness  HPI: 79 y.o. female w/ PMHx significant for atrial flutter, CAD who presents for evaluation on 05/20/2013 with complaints of chest pain and dizziness.  She was seen by her PCP, Dr Chilton Si, this morning for an annual exam. She noted a 30 minute episode of chest discomfort last night. She describes this as a pressure in the substernal region. The pain was nonradiating and was not associated with shortness of breath, diaphoresis, or nausea. The patient does admit to chronic constipation and she noted that after having a bowel movement her symptoms were better. She has not had recurrence of chest pain this morning.  However, the patient has had fairly marked dizziness over the past 24 hours. She has not felt well. She's having difficulty ambulating. When she was seen this morning, she was noted to be in atrial flutter with RVR. A gentle carotid massage was done and the patient had a 5.4 second pause noted. This was captured on a rhythm strip. She had presyncope with this.  She reports normal appetite. She's had no fevers, chills, nausea, or vomiting. She states that her head feels very funny today. She has a pressure over her 4 had and has had some visual changes as well. She had blurry vision last night.  Past Medical History  Diagnosis Date  . Hypertension   . Coronary artery disease   . PVD (peripheral vascular disease)   . Peripheral arterial disease      Surgical History:  Past Surgical History  Procedure Laterality Date  . Bpg    . Pr vein bypass graft,aorto-fem-pop      Right   . Gliayte catherter insertion  06/27/10  . Carotid stent  11/30/09    LAD S/P Right CA DES     Home Meds: Prior to Admission medications   Medication Sig Start  Date End Date Taking? Authorizing Provider  Ascorbic Acid (VITAMIN C) 1000 MG tablet Take 1,000 mg by mouth daily.     Yes Historical Provider, MD  carvedilol (COREG) 6.25 MG tablet take 1/2 tablet in the morning and 1 tablet in the pm if systolic BP is >150 09/12/12  Yes Marinus Maw, MD  Flaxseed, Linseed, (FLAX SEED OIL PO) Take 1 tablet by mouth daily.   Yes Historical Provider, MD  LevOCARNitine (L-CARNITINE) 250 MG TABS Take 2 tablets by mouth daily.     Yes Historical Provider, MD  LORazepam (ATIVAN) 1 MG tablet Take 0.5 mg by mouth as directed.    Yes Historical Provider, MD  Lutein 20 MG TABS Take 20 mg by mouth daily.   Yes Historical Provider, MD  magnesium gluconate (MAGONATE) 500 MG tablet Take 500 mg by mouth 2 (two) times daily.     Yes Historical Provider, MD  multivitamin [facility]) per tablet Take 1 tablet by mouth daily.     Yes Historical Provider, MD  nitroGLYCERIN (NITROSTAT) 0.4 MG SL tablet Place 1 tablet (0.4 mg total) under the tongue every 5 (five) minutes as needed. 04/22/12  Yes Marinus Maw, MD  Potassium 99 MG TABS Take 1 tablet by mouth daily.     Yes Historical Provider, MD  VITAMIN D, CHOLECALCIFEROL, PO Take 1,000 mg by mouth 2 (two) times daily.   Yes Historical Provider,  MD    Allergies:  Allergies  Allergen Reactions  . Cephalosporins Rash  . Amlodipine   . Anaprox [Naproxen Sodium]   . Benzonatate   . Cephalexin   . Clindamycin/Lincomycin   . Clorazepate   . Codeine   . Diltiazem     Dizziness   . Doxycycline   . Fexofenadine   . Hydralazine   . Labetalol   . Lisinopril   . Penicillins   . Phenylephrine   . Phenytoin   . Zolpidem     History   Social History  . Marital Status: Married    Spouse Name: N/A    Number of Children: N/A  . Years of Education: N/A   Occupational History  . Not on file.   Social History Main Topics  . Smoking status: Never Smoker   . Smokeless tobacco: Not on file  . Alcohol Use: No  . Drug Use:  No  . Sexual Activity:    Other Topics Concern  . Not on file   Social History Narrative  . No narrative on file    Family history: No premature coronary artery disease  Review of Systems: General: negative for chills, fever, night sweats. Positive for weight loss  ENT: negative for rhinorrhea or epistaxis Cardiovascular: See history of present illness Dermatological: negative for rash Respiratory: negative for cough or wheezing GI: negative for nausea, vomiting, diarrhea, bright red blood per rectum, melena, or hematemesis. Positive for constipation GU: no hematuria, urgency, or frequency Neurologic: See history of present illness Heme: no easy bruising or bleeding Endo: negative for excessive thirst, thyroid disorder, or flushing Musculoskeletal: negative for joint pain or swelling, negative for myalgias All other systems reviewed and are otherwise negative except as noted above.  Physical Exam: Blood pressure 156/90, pulse 109, height 5\' 2"  (1.575 m), weight 84 lb (38.102 kg). General: Elderly, frail-appearing woman in no acute distress. alert and oriented HEENT: Normocephalic, atraumatic, sclera non-icteric, no xanthomas, nares are without discharge.  Neck: Supple. Carotids 2+ without bruits. JVP normal Lungs: Clear bilaterally to auscultation without wheezes, rales, or rhonchi. Breathing is unlabored. Heart: Irregularly irregular with normal S1 and S2. No murmurs, rubs, or gallops appreciated. Abdomen: Soft, non-tender, non-distended with normoactive bowel sounds. No hepatomegaly. No rebound/guarding. No obvious abdominal masses. Back: No CVA tenderness Msk:  Strength and tone appear normal for age. Extremities: No clubbing, cyanosis, or edema.   Neuro: CNII-XII intact, moves all extremities spontaneously. Psych:  Responds to questions appropriately with a normal affect.   Labs:   Lab Results  Component Value Date   WBC 6.8 06/27/2010   HGB 14.4 06/27/2010   HCT 43.0  06/27/2010   MCV 97.9 06/27/2010   PLT 182 06/27/2010   No results found for this basename: NA, K, CL, CO2, BUN, CREATININE, CALCIUM, LABALBU, PROT, BILITOT, ALKPHOS, ALT, AST, GLUCOSE,  in the last 168 hours No results found for this basename: CKTOTAL, CKMB, TROPONINI,  in the last 72 hours Lab Results  Component Value Date   CHOL  Value: 197        ATP III CLASSIFICATION:  <200     mg/dL   Desirable  045-409  mg/dL   Borderline High  >=811    mg/dL   High        02/23/4781   HDL 82 06/26/2010   LDLCALC  Value: 108        Total Cholesterol/HDL:CHD Risk Coronary Heart Disease Risk Table  Men   Women  1/2 Average Risk   3.4   3.3  Average Risk       5.0   4.4  2 X Average Risk   9.6   7.1  3 X Average Risk  23.4   11.0        Use the calculated Patient Ratio above and the CHD Risk Table to determine the patient's CHD Risk.        ATP III CLASSIFICATION (LDL):  <100     mg/dL   Optimal  161-096  mg/dL   Near or Above                    Optimal  130-159  mg/dL   Borderline  045-409  mg/dL   High  >811     mg/dL   Very High* 02/23/4781   TRIG 34 06/26/2010   Lab Results  Component Value Date   DDIMER  Value: 13.02        AT THE INHOUSE ESTABLISHED CUTOFF VALUE OF 0.48 ug/mL FEU, THIS ASSAY HAS BEEN DOCUMENTED IN THE LITERATURE TO HAVE A SENSITIVITY AND NEGATIVE PREDICTIVE VALUE OF AT LEAST 98 TO 99%.  THE TEST RESULT SHOULD BE CORRELATED WITH AN ASSESSMENT OF THE CLINICAL PROBABILITY OF DVT / VTE.* 12/02/2009    Radiology/Studies:  No results found.   EKG: Atrial tachycardia with variable block, ventricular rate 109 beats per minute, rightward axis, left ventricular hypertrophy with repolarization change  ASSESSMENT AND PLAN:  1. Atrial flutter/atrial tachycardia. The patient has dizziness and I'm not certain whether this is related to her arrhythmia. She did have a prolonged pause with gentle carotid pressure today at Dr. Thomasene Lot office. She will be admitted to the hospital for further  evaluation. Her medications will be titrated. Unfortunately she has a very long list of medication intolerances which include diltiazem. Will keep her on a beta blocker for now. Will obtain electrophysiology consultation. Will check an echocardiogram.  2. Chest pain concerning for unstable angina. The patient has known coronary artery disease. I reviewed her cardiac cath report from 2011 when she underwent stenting of the right coronary artery. Will cycle cardiac enzymes. If recurrence of chest pain she may need cardiac catheterization. Will start her on IV heparin until she is ruled out for myocardial infarction.  3. Dizziness. While her neurologic exam is nonfocal, she is certainly at risk for posterior circulation stroke. She has extensive vascular disease and atrial flutter. Will check a CT scan of the brain and carotid duplex scan. Consider neurology consult and MRI of the brain if persistent symptoms.  Enzo Bi  05/20/2013, 1:27 PM

## 2013-05-20 NOTE — Consult Note (Signed)
ANTICOAGULATION CONSULT NOTE - Initial Consult  Pharmacy Consult for Heparin Indication: chest pain/ACS and atrial flutter  Allergies  Allergen Reactions  . Cephalosporins Rash  . Amlodipine   . Anaprox [Naproxen Sodium]   . Benzonatate   . Cephalexin   . Clindamycin/Lincomycin   . Clorazepate   . Codeine   . Diltiazem     Dizziness   . Doxycycline   . Fexofenadine   . Hydralazine   . Labetalol   . Lisinopril   . Penicillins   . Phenylephrine   . Phenytoin   . Zolpidem     Patient Measurements: Height: 5' 1.81" (157 cm) Weight: 83 lb 15.9 oz (38.1 kg) IBW/kg (Calculated) : 49.67 Heparin Dosing Weight: 38kg  Vital Signs: BP: 156/90 mmHg (11/26 1154) Pulse Rate: 109 (11/26 1154)  Estimated CrCl 31 ml/min  Medical History: Past Medical History  Diagnosis Date  . Hypertension   . Coronary artery disease   . PVD (peripheral vascular disease)   . Peripheral arterial disease     Medications:  No anticoagulants pta  Assessment: 84yof with hx of CAD and aflutter presents with chest pain and dizziness. She will begin IV heparin with plans for cardiac cath if she rules in for MI. Baseline labs wnl.  Goal of Therapy:  Heparin level 0.3-0.7 units/ml Monitor platelets by anticoagulation protocol: Yes   Plan:  1) Heparin bolus 2000 units x 1 2) Heparin drip at 550 units/hr 3) Check 8 hour heparin level 4) Daily heparin level and CBC  Fredrik Rigger 05/20/2013,1:55 PM

## 2013-05-20 NOTE — Patient Instructions (Signed)
Admit to the hospital

## 2013-05-20 NOTE — Progress Notes (Signed)
Cardiologist on call notified of pt's having an episode of ST in the 140s -180s. Pt also had an episode of a-fib/flutter. Pt refused to have an ekg done stating she's already had 4 done today. Pt is asymptomatic & currently in NSR. MD also notified of pt's BNP being > 3000. Pt already on a heparin drip. Will continue to monitor the pt. Sanda Linger, RN

## 2013-05-21 DIAGNOSIS — I369 Nonrheumatic tricuspid valve disorder, unspecified: Secondary | ICD-10-CM

## 2013-05-21 LAB — CBC
HCT: 45.6 % (ref 36.0–46.0)
MCH: 33.7 pg (ref 26.0–34.0)
MCHC: 33.6 g/dL (ref 30.0–36.0)
MCV: 100.4 fL — ABNORMAL HIGH (ref 78.0–100.0)
RBC: 4.54 MIL/uL (ref 3.87–5.11)
RDW: 13.2 % (ref 11.5–15.5)

## 2013-05-21 LAB — HEPARIN LEVEL (UNFRACTIONATED)
Heparin Unfractionated: 0.11 IU/mL — ABNORMAL LOW (ref 0.30–0.70)
Heparin Unfractionated: 0.4 IU/mL (ref 0.30–0.70)
Heparin Unfractionated: 1.69 IU/mL — ABNORMAL HIGH (ref 0.30–0.70)
Heparin Unfractionated: 1.04 IU/mL — ABNORMAL HIGH (ref 0.30–0.70)

## 2013-05-21 LAB — TROPONIN I: Troponin I: <0.3 ng/mL (ref <0.30)

## 2013-05-21 MED ORDER — DIGOXIN 0.25 MG/ML IJ SOLN
0.2500 mg | Freq: Once | INTRAMUSCULAR | Status: AC
  Administered 2013-05-21: 0.25 mg via INTRAVENOUS
  Filled 2013-05-21 (×2): qty 1

## 2013-05-21 MED ORDER — HEPARIN BOLUS VIA INFUSION
1000.0000 [IU] | Freq: Once | INTRAVENOUS | Status: AC
  Administered 2013-05-21: 1000 [IU] via INTRAVENOUS
  Filled 2013-05-21: qty 1000

## 2013-05-21 MED ORDER — HEPARIN (PORCINE) IN NACL 100-0.45 UNIT/ML-% IJ SOLN
600.0000 [IU]/h | INTRAMUSCULAR | Status: DC
  Filled 2013-05-21: qty 250

## 2013-05-21 MED ORDER — LORAZEPAM 0.5 MG PO TABS
0.5000 mg | ORAL_TABLET | Freq: Every evening | ORAL | Status: DC | PRN
  Administered 2013-05-21 – 2013-05-22 (×2): 0.5 mg via ORAL
  Filled 2013-05-21 (×2): qty 1

## 2013-05-21 MED ORDER — HEPARIN (PORCINE) IN NACL 100-0.45 UNIT/ML-% IJ SOLN: 600.0000 [IU]/h | INTRAMUSCULAR | Status: DC

## 2013-05-21 NOTE — Progress Notes (Signed)
ANTICOAGULATION CONSULT NOTE - Follow Up Consult  Pharmacy Consult for heparin Indication: CP and Afib/flutter   Labs:  Recent Labs  05/20/13 1425 05/20/13 2155 05/21/13 0045  HGB 14.3  --   --   HCT 42.7  --   --   PLT 155  --   --   APTT 35  --   --   LABPROT 13.1  --   --   INR 1.01  --   --   HEPARINUNFRC  --   --  0.11*  CREATININE 0.84  --   --   TROPONINI <0.30 <0.30  --     Assessment: 79yo female subtherapeutic on heparin with initial dosing for CP w/ Afib/flutter.  Goal of Therapy:  Heparin level 0.3-0.7 units/ml   Plan:  Will rebolus with heparin 1000 units x1 and increase gtt by 3 units/kg/hr to 700 units/hr and check level in 8hr.  Vernard Gambles, PharmD, BCPS  05/21/2013,1:38 AM

## 2013-05-21 NOTE — H&P (Signed)
   History and Physical  Patient ID: Michelle Gaines MRN: [id number], SOB: 03/21/1929 79 y.o. Date of Encounter: 05/20/2013, 1:27 PM  Primary Physician: GREEN, EDWIN JAY, MD Primary Cardiologist: Dr Taylor  Chief Complaint: Chest pain, dizziness  HPI: 79 y.o. female w/ PMHx significant for atrial flutter, CAD who presents for evaluation on 05/20/2013 with complaints of chest pain and dizziness.  She was seen by her PCP, Dr Green, this morning for an annual exam. She noted a 30 minute episode of chest discomfort last night. She describes this as a pressure in the substernal region. The pain was nonradiating and was not associated with shortness of breath, diaphoresis, or nausea. The patient does admit to chronic constipation and she noted that after having a bowel movement her symptoms were better. She has not had recurrence of chest pain this morning.  However, the patient has had fairly marked dizziness over the past 24 hours. She has not felt well. She's having difficulty ambulating. When she was seen this morning, she was noted to be in atrial flutter with RVR. A gentle carotid massage was done and the patient had a 5.4 second pause noted. This was captured on a rhythm strip. She had presyncope with this.  She reports normal appetite. She's had no fevers, chills, nausea, or vomiting. She states that her head feels very funny today. She has a pressure over her 4 had and has had some visual changes as well. She had blurry vision last night.  Past Medical History  Diagnosis Date  . Hypertension   . Coronary artery disease   . PVD (peripheral vascular disease)   . Peripheral arterial disease      Surgical History:  Past Surgical History  Procedure Laterality Date  . Bpg    . Pr vein bypass graft,aorto-fem-pop      Right   . Gliayte catherter insertion  06/27/10  . Carotid stent  11/30/09    LAD S/P Right CA DES     Home Meds: Prior to Admission medications   Medication Sig Start  Date End Date Taking? Authorizing Provider  Ascorbic Acid (VITAMIN C) 1000 MG tablet Take 1,000 mg by mouth daily.     Yes Historical Provider, MD  carvedilol (COREG) 6.25 MG tablet take 1/2 tablet in the morning and 1 tablet in the pm if systolic BP is >150 09/12/12  Yes Gregg W Taylor, MD  Flaxseed, Linseed, (FLAX SEED OIL PO) Take 1 tablet by mouth daily.   Yes Historical Provider, MD  LevOCARNitine (L-CARNITINE) 250 MG TABS Take 2 tablets by mouth daily.     Yes Historical Provider, MD  LORazepam (ATIVAN) 1 MG tablet Take 0.5 mg by mouth as directed.    Yes Historical Provider, MD  Lutein 20 MG TABS Take 20 mg by mouth daily.   Yes Historical Provider, MD  magnesium gluconate (MAGONATE) 500 MG tablet Take 500 mg by mouth 2 (two) times daily.     Yes Historical Provider, MD  multivitamin (THERAGRAN) per tablet Take 1 tablet by mouth daily.     Yes Historical Provider, MD  nitroGLYCERIN (NITROSTAT) 0.4 MG SL tablet Place 1 tablet (0.4 mg total) under the tongue every 5 (five) minutes as needed. 04/22/12  Yes Gregg W Taylor, MD  Potassium 99 MG TABS Take 1 tablet by mouth daily.     Yes Historical Provider, MD  VITAMIN D, CHOLECALCIFEROL, PO Take 1,000 mg by mouth 2 (two) times daily.   Yes Historical Provider,   MD    Allergies:  Allergies  Allergen Reactions  . Cephalosporins Rash  . Amlodipine   . Anaprox [Naproxen Sodium]   . Benzonatate   . Cephalexin   . Clindamycin/Lincomycin   . Clorazepate   . Codeine   . Diltiazem     Dizziness   . Doxycycline   . Fexofenadine   . Hydralazine   . Labetalol   . Lisinopril   . Penicillins   . Phenylephrine   . Phenytoin   . Zolpidem     History   Social History  . Marital Status: Married    Spouse Name: N/A    Number of Children: N/A  . Years of Education: N/A   Occupational History  . Not on file.   Social History Main Topics  . Smoking status: Never Smoker   . Smokeless tobacco: Not on file  . Alcohol Use: No  . Drug Use:  No  . Sexual Activity:    Other Topics Concern  . Not on file   Social History Narrative  . No narrative on file    Family history: No premature coronary artery disease  Review of Systems: General: negative for chills, fever, night sweats. Positive for weight loss  ENT: negative for rhinorrhea or epistaxis Cardiovascular: See history of present illness Dermatological: negative for rash Respiratory: negative for cough or wheezing GI: negative for nausea, vomiting, diarrhea, bright red blood per rectum, melena, or hematemesis. Positive for constipation GU: no hematuria, urgency, or frequency Neurologic: See history of present illness Heme: no easy bruising or bleeding Endo: negative for excessive thirst, thyroid disorder, or flushing Musculoskeletal: negative for joint pain or swelling, negative for myalgias All other systems reviewed and are otherwise negative except as noted above.  Physical Exam: Blood pressure 156/90, pulse 109, height 5' 2" (1.575 m), weight 84 lb (38.102 kg). General: Elderly, frail-appearing woman in no acute distress. alert and oriented HEENT: Normocephalic, atraumatic, sclera non-icteric, no xanthomas, nares are without discharge.  Neck: Supple. Carotids 2+ without bruits. JVP normal Lungs: Clear bilaterally to auscultation without wheezes, rales, or rhonchi. Breathing is unlabored. Heart: Irregularly irregular with normal S1 and S2. No murmurs, rubs, or gallops appreciated. Abdomen: Soft, non-tender, non-distended with normoactive bowel sounds. No hepatomegaly. No rebound/guarding. No obvious abdominal masses. Back: No CVA tenderness Msk:  Strength and tone appear normal for age. Extremities: No clubbing, cyanosis, or edema.   Neuro: CNII-XII intact, moves all extremities spontaneously. Psych:  Responds to questions appropriately with a normal affect.   Labs:   Lab Results  Component Value Date   WBC 6.8 06/27/2010   HGB 14.4 06/27/2010   HCT 43.0  06/27/2010   MCV 97.9 06/27/2010   PLT 182 06/27/2010   No results found for this basename: NA, K, CL, CO2, BUN, CREATININE, CALCIUM, LABALBU, PROT, BILITOT, ALKPHOS, ALT, AST, GLUCOSE,  in the last 168 hours No results found for this basename: CKTOTAL, CKMB, TROPONINI,  in the last 72 hours Lab Results  Component Value Date   CHOL  Value: 197        ATP III CLASSIFICATION:  <200     mg/dL   Desirable  200-239  mg/dL   Borderline High  >=240    mg/dL   High        06/26/2010   HDL 82 06/26/2010   LDLCALC  Value: 108        Total Cholesterol/HDL:CHD Risk Coronary Heart Disease Risk Table                       Men   Women  1/2 Average Risk   3.4   3.3  Average Risk       5.0   4.4  2 X Average Risk   9.6   7.1  3 X Average Risk  23.4   11.0        Use the calculated Patient Ratio above and the CHD Risk Table to determine the patient's CHD Risk.        ATP III CLASSIFICATION (LDL):  <100     mg/dL   Optimal  100-129  mg/dL   Near or Above                    Optimal  130-159  mg/dL   Borderline  160-189  mg/dL   High  >190     mg/dL   Very High* 06/26/2010   TRIG 34 06/26/2010   Lab Results  Component Value Date   DDIMER  Value: 13.02        AT THE INHOUSE ESTABLISHED CUTOFF VALUE OF 0.48 ug/mL FEU, THIS ASSAY HAS BEEN DOCUMENTED IN THE LITERATURE TO HAVE A SENSITIVITY AND NEGATIVE PREDICTIVE VALUE OF AT LEAST 98 TO 99%.  THE TEST RESULT SHOULD BE CORRELATED WITH AN ASSESSMENT OF THE CLINICAL PROBABILITY OF DVT / VTE.* 12/02/2009    Radiology/Studies:  No results found.   EKG: Atrial tachycardia with variable block, ventricular rate 109 beats per minute, rightward axis, left ventricular hypertrophy with repolarization change  ASSESSMENT AND PLAN:  1. Atrial flutter/atrial tachycardia. The patient has dizziness and I'm not certain whether this is related to her arrhythmia. She did have a prolonged pause with gentle carotid pressure today at Dr. Green's office. She will be admitted to the hospital for further  evaluation. Her medications will be titrated. Unfortunately she has a very long list of medication intolerances which include diltiazem. Will keep her on a beta blocker for now. Will obtain electrophysiology consultation. Will check an echocardiogram.  2. Chest pain concerning for unstable angina. The patient has known coronary artery disease. I reviewed her cardiac cath report from 2011 when she underwent stenting of the right coronary artery. Will cycle cardiac enzymes. If recurrence of chest pain she may need cardiac catheterization. Will start her on IV heparin until she is ruled out for myocardial infarction.  3. Dizziness. While her neurologic exam is nonfocal, she is certainly at risk for posterior circulation stroke. She has extensive vascular disease and atrial flutter. Will check a CT scan of the brain and carotid duplex scan. Consider neurology consult and MRI of the brain if persistent symptoms.  Signed, Collins Dimaria  05/20/2013, 1:27 PM  

## 2013-05-21 NOTE — Progress Notes (Signed)
Patient ID: Michelle Gaines, female   DOB: 05/29/1929, 79 y.o.   MRN: [id number]    CARDIOLOGY CONSULT NOTE   Patient ID: Michelle Gaines MRN: [id number] DOB/AGE: 1928-09-12 79 y.o.  Admit Date: 05/20/2013 Referring Physician: Excell Seltzer Primary Physician: Enrique Sack, MD Consulting Cardiologist: [name] Primary Cardiologist: Elease Hashimoto Reason for Consultation: Atrial flutter  Clinical Summary Michelle Gaines is a 78 y.o.female who underwent catheter ablation in January of 2012. She also has CAD and stenting which preceeded her ablation. She was in her usual state of health until Tuesday evening when she began to experience palpitations associated with chest pain and on presentation yesterday was found to be in atrial flutter with a rapid ventricular response. She has been admitted and placed on IV heparin and her beta blocker has been adjusted. She has an intolerance to calcium channel blockers. She denies syncope. She has rare palpitations. Previously she has been active despite her advanced age but with her heart out of rhythm, she is unable to walk without experiencing chest pressure. She has ruled out for MI.    Allergies  Allergen Reactions  . Fexofenadine Shortness Of Breath  . Cephalosporins Rash  . Amlodipine   . Anaprox [Naproxen Sodium]   . Benzonatate   . Cephalexin   . Clindamycin/Lincomycin   . Clorazepate   . Codeine   . Diltiazem     Dizziness   . Doxycycline   . Hydralazine   . Labetalol   . Lisinopril   . Phenylephrine   . Phenytoin   . Zolpidem   . Penicillins Nausea And Vomiting, Swelling and Rash    Medications Scheduled Medications: . aspirin EC  81 mg Oral Daily  . carvedilol  3.125 mg Oral BID WC  . magnesium oxide  400 mg Oral BID  . multivitamin with minerals  1 tablet Oral Daily  . sodium chloride  3 mL Intravenous Q12H     Infusions: . heparin 700 Units/hr (05/21/13 0157)     PRN Medications:  sodium chloride, acetaminophen,  nitroGLYCERIN, ondansetron (ZOFRAN) IV, sodium chloride   Past Medical History  Diagnosis Date  . Hypertension   . Coronary artery disease   . PVD (peripheral vascular disease)   . Peripheral arterial disease   . Dysrhythmia     ATRIAL FLUTTER  . GERD (gastroesophageal reflux disease)   . Cancer     OVARIAN    1967  . Arthritis     Past Surgical History  Procedure Laterality Date  . Bpg    . Pr vein bypass graft,aorto-fem-pop      Right   . Gliayte catherter insertion  06/27/10  . Carotid stent  11/30/09    LAD S/P Right CA DES  . Abdominal hysterectomy      History reviewed. No pertinent family history.  Social History Michelle Gaines reports that she has never smoked. She has never used smokeless tobacco. Michelle Gaines reports that she does not drink alcohol.  Review of Systems Otherwise reviewed and negative except as outlined.  Physical Examination Blood pressure 136/87, pulse 106, temperature 98.1 F (36.7 C), temperature source Oral, resp. rate 18, height 5' 1.81" (1.57 m), weight 86 lb 4.8 oz (39.145 kg), SpO2 100.00%.  Intake/Output Summary (Last 24 hours) at 05/21/13 0900 Last data filed at 05/20/13 2200  Gross per 24 hour  Intake    360 ml  Output      0 ml  Net    360 ml  Telemetry:  HEENT: Conjunctiva and lids normal, oropharynx clear with moist mucosa. Neck: Supple, no elevated JVP or carotid bruits, no thyromegaly. Lungs: Clear to auscultation, nonlabored breathing at rest. Cardiac: IRegular tachy rhythm, no S3 or significant systolic murmur, no pericardial rub. Abdomen: Soft, nontender, no hepatomegaly, bowel sounds present, no guarding or rebound. Extremities: No pitting edema, distal pulses 2+. Skin: Warm and dry. Musculoskeletal: No kyphosis. Neuropsychiatric: Alert and oriented x3, affect grossly appropriate.  Prior Cardiac Testing/Procedures  Lab Results  Basic Metabolic Panel:  Recent Labs Lab 05/20/13 1425  NA 140  K 3.4*  CL 103   CO2 26  GLUCOSE 171*  BUN 21  CREATININE 0.84  CALCIUM 9.5  MG 2.2    Liver Function Tests:  Recent Labs Lab 05/20/13 1425  AST 24  ALT 22  ALKPHOS 78  BILITOT 0.4  PROT 6.7  ALBUMIN 3.6    CBC:  Recent Labs Lab 05/20/13 1425  WBC 6.4  NEUTROABS 5.5  HGB 14.3  HCT 42.7  MCV 99.8  PLT 155    Cardiac Enzymes:  Recent Labs Lab 05/20/13 1425 05/20/13 2155 05/21/13 0045  TROPONINI <0.30 <0.30 <0.30     Radiology: Dg Chest 2 View  05/20/2013   CLINICAL DATA:  Mid and left chest pain upon exertion.  EXAM: CHEST  2 VIEW  COMPARISON:  06/25/2010  FINDINGS: Emphysematous changes in the lungs. Fibrosis in the lung bases. No focal consolidation or airspace disease. No blunting of costophrenic angles. No pneumothorax. Normal heart size and pulmonary vascularity. No significant change since prior study.  IMPRESSION: Emphysematous changes in the lungs. No evidence of active pulmonary disease.   Electronically Signed   By: Burman Nieves M.D.   On: 05/20/2013 21:13   Ct Head Wo Contrast  05/20/2013   CLINICAL DATA:  Headache and dizziness  EXAM: CT HEAD WITHOUT CONTRAST  TECHNIQUE: Contiguous axial images were obtained from the base of the skull through the vertex without intravenous contrast.  COMPARISON:  MRI of the brain dated February 28, 2005.  FINDINGS:  There is mild diffuse cerebral and cerebellar atrophy appropriate for the patient's age. The ventricles are normal in size and position. There is decreased density in the deep white matter of both cerebral hemispheres consistent with chronic small vessel ischemic type change. This is most conspicuous in the right parietal lobe. There is no evidence of an acute intracranial hemorrhage nor of an evolving ischemic infarction. There is an old lacunar infarction in the anterior aspect of the left subthalamic nucleus. The cerebellum and brainstem are normal in density. At bone window settings the observed portions of the  paranasal sinuses and mastoid air cells are clear. There is no evidence of a skull fracture nor lytic nor blastic lesion.  IMPRESSION: 1. There is no evidence of an acute ischemic or hemorrhagic infarction. 2. There are white matter density changes consistent with chronic small vessel ischemia. An old left thalamic lacune is present. 3. There is mild diffuse cerebral and cerebellar atrophy appropriate for age.   Electronically Signed   By: David  [place]   On: 05/20/2013 18:22     OZH:YQMVHQ flutter with a rapid VR  Impression and Recommendations 1. Recurrent atrial flutter after catheter ablation 2. CAD, ruled out for MI 3. HTN Rec: I have extensively discussed the treatment options with the patient. She is quite anxious. Catheter ablation of atrial flutter is recommended. An alternative would be DCCV but the long term success of this is poor.  Finally, rate control with uptitration of her AV nodal blocking drugs and possible pacemaker has been discussed. She is considering her options. I suspect she will choose catheter ablation. I would plan to do this tomorrow. She will not need a TEE because she has been therapeutically anti-coagulated with IV heparin less than 24 hours since her symptoms started.    Signed: Leonia Reeves.D.

## 2013-05-21 NOTE — Progress Notes (Signed)
  Echocardiogram 2D Echocardiogram has been performed.  Michelle Gaines 05/21/2013, 2:16 PM

## 2013-05-21 NOTE — Consult Note (Signed)
ANTICOAGULATION CONSULT NOTE - Initial Consult  Pharmacy Consult for Heparin Indication: chest pain/ACS and atrial flutter  Allergies  Allergen Reactions  . Fexofenadine Shortness Of Breath  . Cephalosporins Rash  . Amlodipine   . Anaprox [Naproxen Sodium]   . Benzonatate   . Cephalexin   . Clindamycin/Lincomycin   . Clorazepate   . Codeine   . Diltiazem     Dizziness   . Doxycycline   . Hydralazine   . Labetalol   . Lisinopril   . Phenylephrine   . Phenytoin   . Zolpidem   . Penicillins Nausea And Vomiting, Swelling and Rash    Patient Measurements: Height: 5' 1.81" (157 cm) Weight: 86 lb 4.8 oz (39.145 kg) IBW/kg (Calculated) : 49.67 Heparin Dosing Weight: 38kg  Vital Signs: Temp: 98.4 F (36.9 C) (11/27 2031) Temp src: Oral (11/27 2031) BP: 156/60 mmHg (11/27 2031) Pulse Rate: 102 (11/27 2031)  Estimated CrCl 31 ml/min  Medications:  No anticoagulants pta  Assessment: 84yof with hx of CAD and aflutter presents with chest pain and dizziness. She continues IV heparin with possible cardiac ablation tomorrow. Renal function and CBC stable. Heparin level for confirmation reported as 1.6, unlikely to be true result, will repeat lab  Goal of Therapy:  Heparin level 0.3-0.7 units/ml Monitor platelets by anticoagulation protocol: Yes   Plan:  1) Continue Heparin 700units/hr 3) Re-check stat heparin level 4) Daily heparin level and CBC   Thank you for allowing pharmacy to be a part of this patients care team.  Lovenia Kim Pharm.D., BCPS Clinical Pharmacist 05/21/2013 9:03 PM Pager: [phone] Phone: [phone]

## 2013-05-21 NOTE — Progress Notes (Signed)
Patient bathing and HR=170's to 180's, I notified Dr. [name], he stated he would enter orders for a one time dose of IV digoxin.

## 2013-05-21 NOTE — Consult Note (Signed)
ANTICOAGULATION CONSULT NOTE - Initial Consult  Pharmacy Consult for Heparin Indication: chest pain/ACS and atrial flutter  Allergies  Allergen Reactions  . Fexofenadine Shortness Of Breath  . Cephalosporins Rash  . Amlodipine   . Anaprox [Naproxen Sodium]   . Benzonatate   . Cephalexin   . Clindamycin/Lincomycin   . Clorazepate   . Codeine   . Diltiazem     Dizziness   . Doxycycline   . Hydralazine   . Labetalol   . Lisinopril   . Phenylephrine   . Phenytoin   . Zolpidem   . Penicillins Nausea And Vomiting, Swelling and Rash    Patient Measurements: Height: 5' 1.81" (157 cm) Weight: 86 lb 4.8 oz (39.145 kg) IBW/kg (Calculated) : 49.67 Heparin Dosing Weight: 38kg  Vital Signs: Temp: 98.1 F (36.7 C) (11/27 0513) Temp src: Oral (11/27 0513) BP: 136/87 mmHg (11/27 0513) Pulse Rate: 106 (11/27 0513)  Estimated CrCl 31 ml/min  Medications:  No anticoagulants pta  Assessment: 84yof with hx of CAD and aflutter presents with chest pain and dizziness. She continues IV heparin with possible cardiac ablation tomorrow. Renal function and CBC stable. Heparin level at goal now.  Will check confirmatory HL at 8 hour since tentative ablation tomorrow.   Goal of Therapy:  Heparin level 0.3-0.7 units/ml Monitor platelets by anticoagulation protocol: Yes   Plan:  1) Continue Heparin 700units/hr 3) Check 8 hour heparin level 4) Daily heparin level and CBC  Thank you, Piedad Climes, PharmD Clinical Pharmacist - Resident Pager: [phone] Pharmacy: [phone] 05/21/2013 11:52 AM

## 2013-05-21 NOTE — Progress Notes (Signed)
ANTICOAGULATION CONSULT NOTE - Follow Up Consult  Pharmacy Consult for Heparin  Indication: chest pain/ACS, a-flutter   Allergies  Allergen Reactions  . Fexofenadine Shortness Of Breath  . Cephalosporins Rash  . Amlodipine   . Anaprox [Naproxen Sodium]   . Benzonatate   . Cephalexin   . Clindamycin/Lincomycin   . Clorazepate   . Codeine   . Diltiazem     Dizziness   . Doxycycline   . Hydralazine   . Labetalol   . Lisinopril   . Phenylephrine   . Phenytoin   . Zolpidem   . Penicillins Nausea And Vomiting, Swelling and Rash    Patient Measurements: Height: 5' 1.81" (157 cm) Weight: 86 lb 4.8 oz (39.145 kg) IBW/kg (Calculated) : 49.67  Vital Signs: Temp: 98.4 F (36.9 C) (11/27 2031) Temp src: Oral (11/27 2031) BP: 156/60 mmHg (11/27 2031) Pulse Rate: 102 (11/27 2031)  Labs:  Recent Labs  05/20/13 1425 05/20/13 2155  05/21/13 0045 05/21/13 1019 05/21/13 1845 05/21/13 2115  HGB 14.3  --   --   --  15.3*  --   --   HCT 42.7  --   --   --  45.6  --   --   PLT 155  --   --   --  171  --   --   APTT 35  --   --   --   --   --   --   LABPROT 13.1  --   --   --   --   --   --   INR 1.01  --   --   --   --   --   --   HEPARINUNFRC  --   --   < > 0.11* 0.40 1.69* 1.04*  CREATININE 0.84  --   --   --   --   --   --   TROPONINI <0.30 <0.30  --  <0.30  --   --   --   < > = values in this interval not displayed.  Estimated Creatinine Clearance: 31.9 ml/min (by C-G formula based on Cr of 0.84).  Medications:  Heparin 700 units/hr  Assessment: 79 y/o F on heparin for CP/aflutter, possible ablation 11/28. HL was 1.69, concern for incorrect lab drawn, RE-draw was lower at 1.04, but still supra-therapeutic. No issues per RN.   Goal of Therapy:  Heparin level 0.3-0.7 units/ml Monitor platelets by anticoagulation protocol: Yes   Plan:  -Hold heparin x 1 hour -Re-start heparin at 0030 at 600 units/hr -HL with AM labs given timing -Daily CBC/HL -F/U cardiology  plans  Thank you for allowing me to take part in this patient's care,  Abran Duke, PharmD Clinical Pharmacist Phone: [phone] Pager: [phone] 05/21/2013 11:12 PM

## 2013-05-22 ENCOUNTER — Encounter (HOSPITAL_COMMUNITY)
Admission: AD | Disposition: A | Discharge status: Home / Self Care | Payer: Self-pay | Source: Ambulatory Visit | Attending: Cardiovascular Disease

## 2013-05-22 DIAGNOSIS — I4892 Unspecified atrial flutter: Secondary | ICD-10-CM

## 2013-05-22 HISTORY — PX: ATRIAL FLUTTER ABLATION: SHX5733

## 2013-05-22 LAB — HEPARIN LEVEL (UNFRACTIONATED)
Heparin Unfractionated: 0.3 IU/mL (ref 0.30–0.70)
Heparin Unfractionated: 0.19 IU/mL — ABNORMAL LOW (ref 0.30–0.70)

## 2013-05-22 LAB — CBC
HCT: 39.9 % (ref 36.0–46.0)
Hemoglobin: 13.3 g/dL (ref 12.0–15.0)
MCH: 33 pg (ref 26.0–34.0)
MCHC: 33.3 g/dL (ref 30.0–36.0)
RDW: 13.1 % (ref 11.5–15.5)

## 2013-05-22 SURGERY — ATRIAL FLUTTER ABLATION
Anesthesia: LOCAL

## 2013-05-22 MED ORDER — MIDAZOLAM HCL 5 MG/5ML IJ SOLN
INTRAMUSCULAR | Status: AC
  Filled 2013-05-22: qty 5

## 2013-05-22 MED ORDER — FENTANYL CITRATE 0.05 MG/ML IJ SOLN
INTRAMUSCULAR | Status: AC
  Filled 2013-05-22: qty 2

## 2013-05-22 MED ORDER — ACETAMINOPHEN 325 MG PO TABS: 650.0000 mg | ORAL_TABLET | ORAL | Status: DC | PRN

## 2013-05-22 MED ORDER — BUPIVACAINE HCL (PF) 0.25 % IJ SOLN
INTRAMUSCULAR | Status: AC
  Filled 2013-05-22: qty 60

## 2013-05-22 MED ORDER — SODIUM CHLORIDE 0.9 % IJ SOLN: 3.0000 mL | Freq: Two times a day (BID) | INTRAMUSCULAR | Status: DC

## 2013-05-22 MED ORDER — APIXABAN 2.5 MG PO TABS
2.5000 mg | ORAL_TABLET | Freq: Two times a day (BID) | ORAL | Status: DC
  Administered 2013-05-23: 2.5 mg via ORAL
  Filled 2013-05-22 (×3): qty 1

## 2013-05-22 MED ORDER — SODIUM CHLORIDE 0.9 % IV SOLN: 250.0000 mL | INTRAVENOUS | Status: DC | PRN

## 2013-05-22 MED ORDER — SODIUM CHLORIDE 0.9 % IJ SOLN: 3.0000 mL | INTRAMUSCULAR | Status: DC | PRN

## 2013-05-22 MED ORDER — ONDANSETRON HCL 4 MG/2ML IJ SOLN: 4.0000 mg | Freq: Four times a day (QID) | INTRAMUSCULAR | Status: DC | PRN

## 2013-05-22 NOTE — Progress Notes (Signed)
Pt endorses she will not sign consent until physician speaks further with her in the AM regarding procedure. Pt has agreed to remain NPO after midnight. Will continue to monitor and assess.

## 2013-05-22 NOTE — CV Procedure (Signed)
EPS/RFA of atrial flutter performed without immediate complication. W#[id number].

## 2013-05-22 NOTE — Progress Notes (Signed)
ANTICOAGULATION CONSULT NOTE - Follow Up Consult  Pharmacy Consult for Heparin  Indication: chest pain/ACS, a-flutter   Allergies  Allergen Reactions  . Fexofenadine Shortness Of Breath  . Cephalosporins Rash  . Amlodipine   . Anaprox [Naproxen Sodium]   . Benzonatate   . Cephalexin   . Clindamycin/Lincomycin   . Clorazepate   . Codeine   . Diltiazem     Dizziness   . Doxycycline   . Hydralazine   . Labetalol   . Lisinopril   . Phenylephrine   . Phenytoin   . Zolpidem   . Penicillins Nausea And Vomiting, Swelling and Rash    Patient Measurements: Height: 5' 1.81" (157 cm) Weight: 86 lb 4.8 oz (39.145 kg) IBW/kg (Calculated) : 49.67  Vital Signs: Temp: 98.4 F (36.9 C) (11/27 2031) Temp src: Oral (11/27 2031) BP: 156/60 mmHg (11/27 2031) Pulse Rate: 102 (11/27 2031)  Labs:  Recent Labs  05/20/13 1425 05/20/13 2155  05/21/13 0045 05/21/13 1019 05/21/13 1845 05/21/13 2115 05/22/13 0414  HGB 14.3  --   --   --  15.3*  --   --  13.3  HCT 42.7  --   --   --  45.6  --   --  39.9  PLT 155  --   --   --  171  --   --  154  APTT 35  --   --   --   --   --   --   --   LABPROT 13.1  --   --   --   --   --   --   --   INR 1.01  --   --   --   --   --   --   --   HEPARINUNFRC  --   --   < > 0.11* 0.40 1.69* 1.04* 0.30  CREATININE 0.84  --   --   --   --   --   --   --   TROPONINI <0.30 <0.30  --  <0.30  --   --   --   --   < > = values in this interval not displayed.  Estimated Creatinine Clearance: 31.9 ml/min (by C-G formula based on Cr of 0.84).  Medications:  Heparin 700 units/hr  Assessment: 79 y/o F on heparin for CP/aflutter, possible ablation 11/28. HL was 1.69, concern for incorrect lab drawn, RE-draw was lower at 1.04, but still supra-therapeutic. No issues per RN.   Goal of Therapy:  Heparin level 0.3-0.7 units/ml Monitor platelets by anticoagulation protocol: Yes   Plan:  -Hold heparin x 1 hour -Re-start heparin at 0030 at 600 units/hr -HL  with AM labs given timing -Daily CBC/HL -F/U cardiology plans  Thank you for allowing me to take part in this patient's care,  Abran Duke, PharmD Clinical Pharmacist Phone: [phone] Pager: [phone] 05/22/2013 5:21 AM  05/22/2013 5:22 AM HL this AM is 0.30, would normally increase rate slightly, but given apparent accumulation on 700 units/hr, will leave as is and check repeat level in 8 hours at 1300  JLedford, Pharmd

## 2013-05-22 NOTE — Progress Notes (Signed)
SUBJECTIVE: The patient is doing well today.  At this time, she denies chest pain, shortness of breath, or any new concerns.    TSH 0.266 yesterday.  Echocardiogram demonstrated EF 65-70%, no RWMA, trivial pericardial effusion, LA 35.   CURRENT MEDICATIONS: . aspirin EC  81 mg Oral Daily  . carvedilol  3.125 mg Oral BID WC  . magnesium oxide  400 mg Oral BID  . multivitamin with minerals  1 tablet Oral Daily  . sodium chloride  3 mL Intravenous Q12H   . heparin 600 Units/hr (05/22/13 0600)    OBJECTIVE: Physical Exam: Filed Vitals:   05/21/13 1419 05/21/13 1628 05/21/13 2031 05/22/13 0549  BP: 109/65 121/71 156/60 154/105  Pulse: 105 106 102 112  Temp: 98.5 F (36.9 C)  98.4 F (36.9 C) 98.5 F (36.9 C)  TempSrc:   Oral Oral  Resp: 18  16 16   Height:      Weight:    85 lb (38.556 kg)  SpO2: 97% 98% 97% 96%    Intake/Output Summary (Last 24 hours) at 05/22/13 0651 Last data filed at 05/22/13 0600  Gross per 24 hour  Intake  824.5 ml  Output      0 ml  Net  824.5 ml    Telemetry reveals atrial flutter, V rates 90's  GEN- The patient is well appearing, alert and oriented x 3 today.   Head- normocephalic, atraumatic Eyes-  Sclera clear, conjunctiva pink Ears- hearing intact Oropharynx- clear Neck- supple, no JVP Lymph- no cervical lymphadenopathy Lungs- Clear to ausculation bilaterally, normal work of breathing Heart- Regular rate and rhythm, no murmurs, rubs or gallops, PMI not laterally displaced GI- soft, NT, ND, + BS Extremities- no clubbing, cyanosis, or edema Skin- no rash or lesion Psych- euthymic mood, full affect Neuro- strength and sensation are intact  LABS: Basic Metabolic Panel:  Recent Labs  16/10/96 1425  NA 140  K 3.4*  CL 103  CO2 26  GLUCOSE 171*  BUN 21  CREATININE 0.84  CALCIUM 9.5  MG 2.2   Liver Function Tests:  Recent Labs  05/20/13 1425  AST 24  ALT 22  ALKPHOS 78  BILITOT 0.4  PROT 6.7  ALBUMIN 3.6   No  results found for this basename: LIPASE, AMYLASE,  in the last 72 hours CBC:  Recent Labs  05/20/13 1425 05/21/13 1019 05/22/13 0414  WBC 6.4 7.5 5.8  NEUTROABS 5.5  --   --   HGB 14.3 15.3* 13.3  HCT 42.7 45.6 39.9  MCV 99.8 100.4* 99.0  PLT 155 171 154   Cardiac Enzymes:  Recent Labs  05/20/13 1425 05/20/13 2155 05/21/13 0045  TROPONINI <0.30 <0.30 <0.30   Thyroid Function Tests:  Recent Labs  05/20/13 1425  TSH 0.266*    RADIOLOGY: Dg Chest 2 View  05/20/2013   CLINICAL DATA:  Mid and left chest pain upon exertion.  EXAM: CHEST  2 VIEW  COMPARISON:  06/25/2010  FINDINGS: Emphysematous changes in the lungs. Fibrosis in the lung bases. No focal consolidation or airspace disease. No blunting of costophrenic angles. No pneumothorax. Normal heart size and pulmonary vascularity. No significant change since prior study.  IMPRESSION: Emphysematous changes in the lungs. No evidence of active pulmonary disease.   Electronically Signed   By: Burman Nieves M.D.   On: 05/20/2013 21:13   Ct Head Wo Contrast  05/20/2013   CLINICAL DATA:  Headache and dizziness  EXAM: CT HEAD WITHOUT CONTRAST  TECHNIQUE: Contiguous  axial images were obtained from the base of the skull through the vertex without intravenous contrast.  COMPARISON:  MRI of the brain dated February 28, 2005.  FINDINGS:  There is mild diffuse cerebral and cerebellar atrophy appropriate for the patient's age. The ventricles are normal in size and position. There is decreased density in the deep white matter of both cerebral hemispheres consistent with chronic small vessel ischemic type change. This is most conspicuous in the right parietal lobe. There is no evidence of an acute intracranial hemorrhage nor of an evolving ischemic infarction. There is an old lacunar infarction in the anterior aspect of the left subthalamic nucleus. The cerebellum and brainstem are normal in density. At bone window settings the observed portions  of the paranasal sinuses and mastoid air cells are clear. There is no evidence of a skull fracture nor lytic nor blastic lesion.  IMPRESSION: 1. There is no evidence of an acute ischemic or hemorrhagic infarction. 2. There are white matter density changes consistent with chronic small vessel ischemia. An old left thalamic lacune is present. 3. There is mild diffuse cerebral and cerebellar atrophy appropriate for age.   Electronically Signed   By: David  [place]   On: 05/20/2013 18:22    ASSESSMENT AND PLAN:  Active Problems:   Atrial flutter   Rec:I have discussed the treatment options with the patient and her husband. The risks/benefits/goals/expectations of atrial flutter ablation have been discussed with the patient and she wishes to proceed.  Leonia Reeves.D.

## 2013-05-23 LAB — CBC
HCT: 35.6 % — ABNORMAL LOW (ref 36.0–46.0)
Hemoglobin: 11.8 g/dL — ABNORMAL LOW (ref 12.0–15.0)
MCH: 33 pg (ref 26.0–34.0)
MCHC: 33.1 g/dL (ref 30.0–36.0)
MCV: 99.4 fL (ref 78.0–100.0)
Platelets: 136 10*3/uL — ABNORMAL LOW (ref 150–400)
RBC: 3.58 MIL/uL — ABNORMAL LOW (ref 3.87–5.11)
WBC: 5.5 10*3/uL (ref 4.0–10.5)

## 2013-05-23 MED ORDER — APIXABAN 5 MG PO TABS: 5.0000 mg | ORAL_TABLET | Freq: Two times a day (BID) | ORAL | Status: DC

## 2013-05-23 MED ORDER — APIXABAN 2.5 MG PO TABS: 2.5000 mg | ORAL_TABLET | Freq: Two times a day (BID) | ORAL | Status: DC

## 2013-05-23 MED ORDER — NITROGLYCERIN 0.4 MG SL SUBL: 0.4000 mg | SUBLINGUAL_TABLET | SUBLINGUAL | Status: AC | PRN

## 2013-05-23 MED ORDER — CARVEDILOL 3.125 MG PO TABS: 3.1250 mg | ORAL_TABLET | Freq: Two times a day (BID) | ORAL | Status: DC

## 2013-05-23 NOTE — Progress Notes (Signed)
Called by RN re: Eliquis. Unable to get pre-authorization this weekend.  Plan: Give pt Rx for 4 tablets. Send message to office so they can obtain pre-authorization on Monday. Pt then able to use 30-day free card and co-pay card from manufacturer.   All d/c instructions remain the same.

## 2013-05-23 NOTE — Plan of Care (Signed)
Problem: Discharge Progression Outcomes Goal: Vascular site scale level 0 - I Vascular Site Scale Level 0: No bruising/bleeding/hematoma Level I (Mild): Bruising/Ecchymosis, minimal bleeding/ooozing, palpable hematoma < 3 cm Level II (Moderate): Bleeding not affecting hemodynamic parameters, pseudoaneurysm, palpable hematoma > 3 cm Level III (Severe) Bleeding which affects hemodynamic parameters or retroperitoneal hemorrhage  Outcome: Completed/Met Date Met:  05/23/13 Level (0)

## 2013-05-23 NOTE — Op Note (Signed)
NAMEKALAYNA, NOY             ACCOUNT NO.:  [id number]  MEDICAL RECORD NO.:  [id number]  LOCATION:  3W20C                        FACILITY:  [facility]  PHYSICIAN:  Doylene Canning. [name], MD    DATE OF BIRTH:  December 09, 1928  DATE OF PROCEDURE:  05/22/2013 DATE OF DISCHARGE:                              OPERATIVE REPORT   PROCEDURE PERFORMED:  Electrophysiologic study and radiofrequency catheter ablation of atrial flutter.  INTRODUCTION:  The patient is an 79 year old woman with a history of tachy palpitations and documented atrial flutter who underwent catheter ablation almost 3 years ago.  She had done well, but then developed recurrent tachy palpitation, was found to have atrial flutter with a rapid ventricular response, and was admitted to the hospital for additional evaluation.  She ruled out for MI even though she did have chest pressure during her presentation.  She was treated with AV nodal blocking drugs.  She was placed on heparin within 12 hours of her symptoms, and now presents for catheter ablation.  PROCEDURE:  After informed consent was obtained, the patient was taken to the diagnostic EP lab in a fasting state.  After usual preparation and draping, intravenous fentanyl and midazolam was given for sedation. A 6-French hexapolar catheter was inserted percutaneously into the right jugular vein and advanced to the coronary sinus.  A 6-French quadripolar catheter was inserted percutaneously in the right femoral vein and advanced to the His bundle region.  A 7-French quadripolar ablation catheter was inserted percutaneously in the right femoral vein and advanced to the right atrium.  Mapping was first carried out.  The atrial flutter appeared to be typical counterclockwise tricuspid annular reentry.  The cycle length was initially 296 milliseconds.  Entrainment mapping was carried out by pacing in the atrial flutter isthmus, and the stimulation to A interval was 300 milliseconds  almost exactly the same as the tachycardia cycle length.  At this point, we have confirmed a diagnosis of recurrent isthmus dependent counterclockwise atrial flutter and a 7-French quadripolar ablation catheter was then maneuvered onto the tricuspid valve annulus.  A total of 5 RF energy applications were subsequently delivered to each RF was carried out for anywhere from 60 seconds to 180 seconds.  During the 2nd RF energy application, atrial flutter terminated and sinus rhythm was restored.  Three bonus RF energy applications were delivered in stimulation to A time was over 150 milliseconds.  In addition, we paced the atrium down to 240 milliseconds, and had no recurrent inducible atrial flutter.  At this point, rapid ventricular pacing was carried out from the right ventricle demonstrating VA dissociation at 600 milliseconds. Programmed ventricular stimulation was carried out demonstrating VA dissociation at 600 milliseconds.  Programmed atrial stimulation was carried out from the atrium at a base drive cycle length of 161 milliseconds.  The S1-S2 interval stepwise decreased down to 300 milliseconds where the AV node ERP was observed.  During programmed atrial stimulation, there was no inducible SVT and there were no AH jumps or echo beats.  Finally rapid atrial pacing was carried out and stepwise decreased down to 360 milliseconds where AV Wenckebach was observed.  During rapid atrial pacing, the PR interval was never greater  than the RR interval and there was no inducible SVT.  Finally additional pacing was carried out down to 240 milliseconds and there was no inducible atrial flutter.  At this point, the patient was observed for 30 minutes and during this time, additional rapid atrial pacing was carried out demonstrating a persistently long stimulation to atrial activation time of 150 milliseconds or greater.  The catheters were then removed, hemostasis was assured, and the patient  was returned to her room in satisfactory condition.  COMPLICATIONS:  There were no immediate procedure complications.  RESULTS:  A.  Baseline ECG.  Baseline ECG demonstrated atrial flutter with variable AV conduction.  Following catheter ablation, the ECG demonstrated sinus rhythm. B.  Baseline intervals.  Sinus node cycle length following ablation was 850 milliseconds.  Prior to ablation, the flutter cycle length was 296 milliseconds and the HV interval was 51 milliseconds.  Following ablation, the QRS duration was 76 milliseconds and the HV interval was also 51 milliseconds. C.  Rapid ventricular pacing.  Following ablation, rapid ventricular pacing was carried out from the right ventricle demonstrating VA dissociation at 600 milliseconds. D.  Programmed ventricular stimulation.  Programmed ventricular stimulation was carried out from the right ventricle demonstrating VA dissociation at 600 milliseconds. E.  Programmed atrial stimulation.  Programmed atrial stimulation was carried out from the atrium at a base drive cycle length of 735 milliseconds.  The S1 and S2 interval stepwise decreased from 440 milliseconds down to 300 milliseconds where the AV node ERP was observed.  During programmed atrial stimulation, there were no AH jumps, no echo beats, no inducible SVT. F.  Rapid atrial pacing.  Rapid atrial pacing was carried out following catheter ablation demonstrating an AV Wenckebach cycle length of 360 milliseconds.  Prior to ablation, rapid atrial pacing was carried out at 270 milliseconds in atrial flutter circuit demonstrating concealed entrainment and following ablation rapid atrial pacing was carried out down to 240 milliseconds demonstrating no inducible atrial flutter. G.  Arrhythmias observed. 1. Atrial flutter initiation present at the time of EP study.  The     duration was sustained, cycle length was 296 milliseconds.  Method     of termination was with catheter  ablation.     a.     Mapping.  Mapping of atrial flutter isthmus demonstrated      usual size and orientation.     b.     RF energy application.  A total of 5 RF energy applications      were delivered resulting in termination of flutter, restoration of      sinus rhythm, creation of bidirectional block and atrial flutter      isthmus.  CONCLUSION:  This study demonstrates successful electrophysiologic study and RF catheter ablation of typical atrial flutter with a total of 5 RF energy applications to the atrial flutter isthmus rendering the tachycardia not inducible and terminating atrial flutter.     Doylene Canning. [name], MD     GWT/MEDQ  D:  05/22/2013  T:  05/23/2013  Job:  [id number]

## 2013-05-23 NOTE — Discharge Summary (Signed)
CARDIOLOGY DISCHARGE SUMMARY   Patient ID: Michelle Gaines MRN: [id number] DOB/AGE: Sep 11, 1928 79 y.o.  Admit date: 05/20/2013 Discharge date: 05/23/2013  PCP: Enrique Sack, MD Primary Cardiologist: GT  Primary Discharge Diagnosis:    Secondary Discharge Diagnosis:  Active Problems:   Atrial flutter   Consults: EP  Procedures: Electrophysiologic study and radiofrequency catheter ablation of atrial flutter, CT of the head without contrast, 2-D echocardiogram  Hospital Course: Michelle Gaines is a 79 y.o. female with a history of CAD and atrial flutter. She saw her primary care physician for a routine visit on the day of admission complaining of dizziness. She was in atrial flutter and had a 5.4 second pause with carotid massage. She was admitted for further evaluation and treatment.  Because of the dizziness, she had a head CT performed. There was no evidence of an acute ischemic or hemorrhagic infarction. She was mildly hypokalemic but this was supplemented. There were no other significant electrolyte abnormalities. A TSH was checked and was noted to be slightly low. It had been low in 2012. She is encouraged to followup with her family physician for this.  An electrophysiology consult was called and she was seen by Dr. [name]. She was noted to have recurrent atrial flutter after catheter ablation. Options were considered including ablation, cardioversion and rate control.   Ablation was felt to be the best option. She was anticoagulated with heparin. TEE was not needed because she was anticoagulated less than 24 hours after onset. Her heparin levels were followed closely by pharmacy.  Electrophysiology Study and ablation were performed on 05/22/2013. It was successful, and she was in sinus rhythm. She tolerated the procedure well.  On 05/23/2013, she was seen by Dr. Eden Emms. She was maintaining sinus rhythm and had no complications from the procedure. Initially the plan  was to place her on Eliquis, however she is reluctant to take it. The stroke risk was reviewed with the patient and she has agreed to take Eliquis for a few weeks. She is encouraged to take it until she follows up with EP. No further inpatient workup was indicated and Michelle Gaines is considered stable for discharge, to followup as an outpatient.  Labs:  Lab Results  Component Value Date   WBC 5.5 05/23/2013   HGB 11.8* 05/23/2013   HCT 35.6* 05/23/2013   MCV 99.4 05/23/2013   PLT 136* 05/23/2013     Recent Labs Lab 05/20/13 1425  NA 140  K 3.4*  CL 103  CO2 26  BUN 21  CREATININE 0.84  CALCIUM 9.5  PROT 6.7  BILITOT 0.4  ALKPHOS 78  ALT 22  AST 24  GLUCOSE 171*    Recent Labs  05/20/13 1425 05/20/13 2155 05/21/13 0045  TROPONINI <0.30 <0.30 <0.30   Pro B Natriuretic peptide (BNP)  Date/Time Value Range Status  05/20/2013  2:25 PM 3052.0* 0 - 450 pg/mL Final  06/25/2010 12:31 PM 517.0* 0.0 - 100.0 pg/mL Final    Recent Labs  05/20/13 1425  INR 1.01   Lab Results  Component Value Date   TSH 0.266* 05/20/2013           Radiology: Dg Chest 2 View 05/20/2013   CLINICAL DATA:  Mid and left chest pain upon exertion.  EXAM: CHEST  2 VIEW  COMPARISON:  06/25/2010  FINDINGS: Emphysematous changes in the lungs. Fibrosis in the lung bases. No focal consolidation or airspace disease. No blunting of costophrenic angles. No pneumothorax. Normal  heart size and pulmonary vascularity. No significant change since prior study.  IMPRESSION: Emphysematous changes in the lungs. No evidence of active pulmonary disease.   Electronically Signed   By: Burman Nieves M.D.   On: 05/20/2013 21:13   Ct Head Wo Contrast 05/20/2013   CLINICAL DATA:  Headache and dizziness  EXAM: CT HEAD WITHOUT CONTRAST  TECHNIQUE: Contiguous axial images were obtained from the base of the skull through the vertex without intravenous contrast.  COMPARISON:  MRI of the brain dated February 28, 2005.   FINDINGS:  There is mild diffuse cerebral and cerebellar atrophy appropriate for the patient's age. The ventricles are normal in size and position. There is decreased density in the deep white matter of both cerebral hemispheres consistent with chronic small vessel ischemic type change. This is most conspicuous in the right parietal lobe. There is no evidence of an acute intracranial hemorrhage nor of an evolving ischemic infarction. There is an old lacunar infarction in the anterior aspect of the left subthalamic nucleus. The cerebellum and brainstem are normal in density. At bone window settings the observed portions of the paranasal sinuses and mastoid air cells are clear. There is no evidence of a skull fracture nor lytic nor blastic lesion.  IMPRESSION: 1. There is no evidence of an acute ischemic or hemorrhagic infarction. 2. There are white matter density changes consistent with chronic small vessel ischemia. An old left thalamic lacune is present. 3. There is mild diffuse cerebral and cerebellar atrophy appropriate for age.   Electronically Signed   By: David  [place]   On: 05/20/2013 18:22    Electrophysiology study and ablation: 05/22/2013 RESULTS: A. Baseline ECG. Baseline ECG demonstrated atrial flutter  with variable AV conduction. Following catheter ablation, the ECG  demonstrated sinus rhythm.  B. Baseline intervals. Sinus node cycle length following ablation was  850 milliseconds. Prior to ablation, the flutter cycle length was 296  milliseconds and the HV interval was 51 milliseconds. Following  ablation, the QRS duration was 76 milliseconds and the HV interval was  also 51 milliseconds.  C. Rapid ventricular pacing. Following ablation, rapid ventricular  pacing was carried out from the right ventricle demonstrating VA  dissociation at 600 milliseconds.  D. Programmed ventricular stimulation. Programmed ventricular  stimulation was carried out from the right ventricle demonstrating VA   dissociation at 600 milliseconds.  E. Programmed atrial stimulation. Programmed atrial stimulation was  carried out from the atrium at a base drive cycle length of 161  milliseconds. The S1 and S2 interval stepwise decreased from 440  milliseconds down to 300 milliseconds where the AV node ERP was  observed. During programmed atrial stimulation, there were no AH jumps,  no echo beats, no inducible SVT.  F. Rapid atrial pacing. Rapid atrial pacing was carried out following  catheter ablation demonstrating an AV Wenckebach cycle length of 360  milliseconds. Prior to ablation, rapid atrial pacing was carried out at  270 milliseconds in atrial flutter circuit demonstrating concealed  entrainment and following ablation rapid atrial pacing was carried out  down to 240 milliseconds demonstrating no inducible atrial flutter.  G. Arrhythmias observed.  1. Atrial flutter initiation present at the time of EP study. The  duration was sustained, cycle length was 296 milliseconds. Method  of termination was with catheter ablation.  a. Mapping. Mapping of atrial flutter isthmus demonstrated  usual size and orientation.  b. RF energy application. A total of 5 RF energy applications  were delivered resulting in termination  of flutter, restoration of  sinus rhythm, creation of bidirectional block and atrial flutter  isthmus.  CONCLUSION: This study demonstrates successful electrophysiologic study  and RF catheter ablation of typical atrial flutter with a total of 5 RF  energy applications to the atrial flutter isthmus rendering the  tachycardia not inducible and terminating atrial flutter.   EKG: 05/23/2013 Sinus rhythm Vent. rate 63 BPM PR interval 160 ms QRS duration 86 ms QT/QTc 430/440 ms P-R-T axes 74 63 69  Echo: 05/21/2013 Conclusions - Left ventricle: The cavity size was normal. Wall thickness was increased in a pattern of mild LVH. Systolic function was vigorous. The estimated  ejection fraction was in the range of 65% to 70%. Wall motion was normal; there were no regional wall motion abnormalities. - Atrial septum: No defect or patent foramen ovale was identified. - Pericardium, extracardiac: A trivial pericardial effusion was identified posterior to the heart.   FOLLOW UP PLANS AND APPOINTMENTS Allergies  Allergen Reactions  . Fexofenadine Shortness Of Breath  . Cephalosporins Rash  . Amlodipine   . Anaprox [Naproxen Sodium]   . Benzonatate   . Cephalexin   . Clindamycin/Lincomycin   . Clorazepate   . Codeine   . Diltiazem     Dizziness   . Doxycycline   . Hydralazine   . Labetalol   . Lisinopril   . Phenylephrine   . Phenytoin   . Zolpidem   . Penicillins Nausea And Vomiting, Swelling and Rash     Medication List         apixaban 5 MG Tabs tablet  Commonly known as:  ELIQUIS  Take 1 tablet (5 mg total) by mouth 2 (two) times daily.     carvedilol 3.125 MG tablet  Commonly known as:  COREG  Take 1 tablet (3.125 mg total) by mouth 2 (two) times daily with a meal. Okay to take one additional tablet daily as needed SBP > 150.     cyanocobalamin 100 MCG tablet  Take 100 mcg by mouth daily.     FLAX SEED OIL PO  Take 1 tablet by mouth daily.     L-Carnitine 250 MG Tabs  Take 2 tablets by mouth daily.     LORazepam 0.5 MG tablet  Commonly known as:  ATIVAN  Take 0.5 mg by mouth every 8 (eight) hours as needed for anxiety.     Lutein 20 MG Tabs  Take 20 mg by mouth daily.     magnesium gluconate 500 MG tablet  Commonly known as:  MAGONATE  Take 500 mg by mouth 2 (two) times daily.     multivitamin per tablet  Take 1 tablet by mouth daily.     nitroGLYCERIN 0.4 MG SL tablet  Commonly known as:  NITROSTAT  Place 0.4 mg under the tongue every 5 (five) minutes as needed for chest pain.     Potassium 99 MG Tabs  Take 1 tablet by mouth daily.     vitamin C 1000 MG tablet  Take 1,000 mg by mouth daily.     VITAMIN D  (CHOLECALCIFEROL) PO  Take 1,000 mg by mouth 2 (two) times daily.        Discharge Orders   Future Appointments Provider Department Dept Phone   06/23/2013 10:30 AM Herby Abraham Beverly Sessions [facility] [place] Office [phone]   12/29/2013 2:00 PM [facility] [place] [facility] [address] [phone]   12/29/2013 2:30 PM [facility]  ST [phone]   12/29/2013 3:00 PM Pryor Ochoa, MD [facility] [phone]   Future Orders Complete By Expires   Diet - low sodium heart healthy  As directed    Increase activity slowly  As directed        BRING ALL MEDICATIONS WITH YOU TO FOLLOW UP APPOINTMENTS  Time spent with patient to include physician time: 37 min Signed: Theodore Demark, PA-C 05/23/2013, 12:12 PM Co-Sign MD

## 2013-05-23 NOTE — Progress Notes (Signed)
Utilization review completed. Keilan Nichol, RN, BSN. 

## 2013-05-23 NOTE — Progress Notes (Signed)
SUBJECTIVE: No complaints wants to go home Does not think she wants to take Eliquis.  Discussed stroke risk at length with her  Needs refill on nitro  CURRENT MEDICATIONS: . apixaban  2.5 mg Oral BID  . aspirin EC  81 mg Oral Daily  . carvedilol  3.125 mg Oral BID WC  . magnesium oxide  400 mg Oral BID  . multivitamin with minerals  1 tablet Oral Daily  . sodium chloride  3 mL Intravenous Q12H  . sodium chloride  3 mL Intravenous Q12H      OBJECTIVE: Physical Exam: Filed Vitals:   05/22/13 1957 05/22/13 2000 05/23/13 0455 05/23/13 0745  BP: 138/65 149/70 172/85 159/33  Pulse: 83 89 64 70  Temp: 98.5 F (36.9 C)  98.1 F (36.7 C)   TempSrc: Oral  Oral   Resp: 18 18 18    Height:      Weight:   85 lb 14.4 oz (38.964 kg)   SpO2: 97% 97% 100%     Intake/Output Summary (Last 24 hours) at 05/23/13 0858 Last data filed at 05/22/13 2228  Gross per 24 hour  Intake      3 ml  Output      0 ml  Net      3 ml    Telemetry reveals atrial flutter, V rates 90's  GEN- The patient is well appearing, alert and oriented x 3 today.   Head- normocephalic, atraumatic Eyes-  Sclera clear, conjunctiva pink Ears- hearing intact Oropharynx- clear Neck- supple, no JVP Lymph- no cervical lymphadenopathy Lungs- Clear to ausculation bilaterally, normal work of breathing Heart- Regular rate and rhythm, no murmurs, rubs or gallops, PMI not laterally displaced GI- soft, NT, ND, + BS Extremities- no clubbing, cyanosis, or edema Skin- no rash or lesion Psych- euthymic mood, full affect Neuro- strength and sensation are intact  LABS: Basic Metabolic Panel:  Recent Labs  47/82/95 1425  NA 140  K 3.4*  CL 103  CO2 26  GLUCOSE 171*  BUN 21  CREATININE 0.84  CALCIUM 9.5  MG 2.2   Liver Function Tests:  Recent Labs  05/20/13 1425  AST 24  ALT 22  ALKPHOS 78  BILITOT 0.4  PROT 6.7  ALBUMIN 3.6   CBC:  Recent Labs  05/20/13 1425  05/22/13 0414 05/23/13 0706  WBC 6.4   < > 5.8 5.5  NEUTROABS 5.5  --   --   --   HGB 14.3  < > 13.3 11.8*  HCT 42.7  < > 39.9 35.6*  MCV 99.8  < > 99.0 99.4  PLT 155  < > 154 136*  < > = values in this interval not displayed. Cardiac Enzymes:  Recent Labs  05/20/13 1425 05/20/13 2155 05/21/13 0045  TROPONINI <0.30 <0.30 <0.30   Thyroid Function Tests:  Recent Labs  05/20/13 1425  TSH 0.266*    RADIOLOGY: Dg Chest 2 View  05/20/2013   CLINICAL DATA:  Mid and left chest pain upon exertion.  EXAM: CHEST  2 VIEW  COMPARISON:  06/25/2010  FINDINGS: Emphysematous changes in the lungs. Fibrosis in the lung bases. No focal consolidation or airspace disease. No blunting of costophrenic angles. No pneumothorax. Normal heart size and pulmonary vascularity. No significant change since prior study.  IMPRESSION: Emphysematous changes in the lungs. No evidence of active pulmonary disease.   Electronically Signed   By: Burman Nieves M.D.   On: 05/20/2013 21:13   Ct Head Wo Contrast  05/20/2013   CLINICAL DATA:  Headache and dizziness  EXAM: CT HEAD WITHOUT CONTRAST  TECHNIQUE: Contiguous axial images were obtained from the base of the skull through the vertex without intravenous contrast.  COMPARISON:  MRI of the brain dated February 28, 2005.  FINDINGS:  There is mild diffuse cerebral and cerebellar atrophy appropriate for the patient's age. The ventricles are normal in size and position. There is decreased density in the deep white matter of both cerebral hemispheres consistent with chronic small vessel ischemic type change. This is most conspicuous in the right parietal lobe. There is no evidence of an acute intracranial hemorrhage nor of an evolving ischemic infarction. There is an old lacunar infarction in the anterior aspect of the left subthalamic nucleus. The cerebellum and brainstem are normal in density. At bone window settings the observed portions of the paranasal sinuses and mastoid air cells are clear. There is no  evidence of a skull fracture nor lytic nor blastic lesion.  IMPRESSION: 1. There is no evidence of an acute ischemic or hemorrhagic infarction. 2. There are white matter density changes consistent with chronic small vessel ischemia. An old left thalamic lacune is present. 3. There is mild diffuse cerebral and cerebellar atrophy appropriate for age.   Electronically Signed   By: David  [place]   On: 05/20/2013 18:22    ASSESSMENT AND PLAN:  Active Problems:   Atrial flutter   S/P ablation:  Maint NSR  After much discussion patient thinks she will take Eliquis for a few weeks.   D/C home today  Continue beta blocker Needs f/u TSH/T4 with primary

## 2013-05-23 NOTE — Progress Notes (Signed)
   CARE MANAGEMENT NOTE 05/23/2013  Patient:  Michelle Gaines, Michelle Gaines   Account Number:  [id number]  Date Initiated:  05/23/2013  Documentation initiated by:  [facility]  Subjective/Objective Assessment:   adm: Chest pain, dizziness     Action/Plan:   discharge planning   Anticipated DC Date:  05/23/2013   Anticipated DC Plan:  HOME/SELF CARE      DC Planning Services  CM consult      Choice offered to / List presented to:             Status of service:  Completed, signed off Medicare Important Message given?   (If response is "NO", the following Medicare IM given date fields will be blank) Date Medicare IM given:   Date Additional Medicare IM given:    Discharge Disposition:  HOME/SELF CARE  Per UR Regulation:    If discussed at Long Length of Stay Meetings, dates discussed:    Comments:  05/23/13 14:00 RN called CM to see about Eliquis free trial cards.  CM attempted activation but bc pt has Medicare complete, was not offered free trial without preauthorization.  MD made aware.  4 doses were sent with pt home and MD to have their office do preauthorization of Eliquis on Monday prior to pt going to [facility] pharmacy for prescription to be filled.  [facility] ran script and found out abot pre-authorization but states they will have med when preauth is completed.  All explained to pt by CM and RN.  No other CM needs were communicated.  Freddy Jaksch, BSN, CM [phone].

## 2013-05-25 ENCOUNTER — Telehealth: Payer: Self-pay | Admitting: *Deleted

## 2013-05-25 ENCOUNTER — Other Ambulatory Visit: Payer: Self-pay | Admitting: *Deleted

## 2013-05-25 MED ORDER — APIXABAN 5 MG PO TABS: 5.0000 mg | ORAL_TABLET | Freq: Two times a day (BID) | ORAL | Status: DC

## 2013-05-25 NOTE — Telephone Encounter (Signed)
Received a call from nurse at the hospital regarding patients Eliquis dose. Patient had called the hospital stating she was unable to get in touch with anyone at our office. Patient states she received Eliquis 2.5 mg twice a day samples when she left the hospital and Rx sent to pharmacy was for 5 mg twice a day. Discussed with Dawayne Patricia NP and Eliquis 5 mg twice a day appropriate dose for patient. Advised patient.

## 2013-06-23 ENCOUNTER — Ambulatory Visit (INDEPENDENT_AMBULATORY_CARE_PROVIDER_SITE_OTHER): Payer: Medicare Other | Admitting: Cardiology

## 2013-06-23 ENCOUNTER — Encounter: Payer: Self-pay | Admitting: Cardiology

## 2013-06-23 VITALS — BP 180/86 | HR 64 | Ht 61.81 in | Wt 84.8 lb

## 2013-06-23 DIAGNOSIS — Z8679 Personal history of other diseases of the circulatory system: Secondary | ICD-10-CM

## 2013-06-23 DIAGNOSIS — Z9889 Other specified postprocedural states: Secondary | ICD-10-CM

## 2013-06-23 DIAGNOSIS — R42 Dizziness and giddiness: Secondary | ICD-10-CM

## 2013-06-23 DIAGNOSIS — I4892 Unspecified atrial flutter: Secondary | ICD-10-CM

## 2013-06-23 DIAGNOSIS — I10 Essential (primary) hypertension: Secondary | ICD-10-CM

## 2013-06-23 NOTE — Patient Instructions (Addendum)
Your physician recommends that you schedule a follow-up appointment in: WITH DR. [name] IN 6 WEEKS  Your physician has recommended you make the following change in your medication:   STOP YOUR ELIQUIS  Your physician recommends that you continue on your current medications as directed. Please refer to the Current Medication list given to you today.

## 2013-06-23 NOTE — Progress Notes (Signed)
Patient ID: Michelle Gaines MRN: [id number], DOB/AGE: 1928/10/30   Date of Visit: 06/23/2013  Primary Physician: Enrique Sack, MD Primary Cardiologist: Excell Seltzer, MD Primary EP: [name], MD Vascular Surgeon: Hart Rochester, MD Reason for Visit: Hospital follow-up  History of Present Illness  Michelle Gaines is a 79 y.o. female with CAD, PVD, HTN and atrial flutter who recently underwent EPS +RF ablation of typical atrial flutter. She presents today for hospital followup.   Since discharge, she reports she has recovered well. Her groin site is well healed. She has chronic dizziness and continues to have this intermittently. Head CT was done during her recent hospitalization which was negative for acute abnormality. Dr. Excell Seltzer on admission recommended carotid dopplers and referral to Neurology. This was not done. Her PVD is followed by Dr. Hart Rochester. She states Eliquis made her dizziness worse and she would like to stop this medication if able. She also reports any increase in her BP meds in the past has caused more dizziness. She brought her BP readings from home - SBP range 100-134. She denies chest pain or shortness of breath. She denies palpitations, near syncope or syncope. She denies LE swelling, orthopnea, PND or recent weight gain. She is compliant with medications. She is allergic / intolerant to multiple medications.  Past Medical History Past Medical History  Diagnosis Date  . Hypertension   . Coronary artery disease   . PVD (peripheral vascular disease)   . Peripheral arterial disease   . Dysrhythmia     ATRIAL FLUTTER  . GERD (gastroesophageal reflux disease)   . Cancer     OVARIAN    1967  . Arthritis     Past Surgical History Past Surgical History  Procedure Laterality Date  . Bpg    . Pr vein bypass graft,aorto-fem-pop      Right   . Gliayte catherter insertion  06/27/10  . Carotid stent  11/30/09    LAD S/P Right CA DES  . Abdominal hysterectomy        Allergies/Intolerances Allergies  Allergen Reactions  . Fexofenadine Shortness Of Breath  . Cephalosporins Rash  . Amlodipine   . Anaprox [Naproxen Sodium]   . Benzonatate   . Cephalexin   . Clindamycin/Lincomycin   . Clorazepate   . Codeine   . Diltiazem     Dizziness   . Doxycycline   . Hydralazine   . Labetalol   . Lisinopril   . Phenylephrine   . Phenytoin   . Zolpidem   . Penicillins Nausea And Vomiting, Swelling and Rash    Current Home Medications Current Outpatient Prescriptions  Medication Sig Dispense Refill  . apixaban (ELIQUIS) 5 MG TABS tablet Take 1 tablet (5 mg total) by mouth 2 (two) times daily.  60 tablet  0  . Ascorbic Acid (VITAMIN C) 1000 MG tablet Take 1,000 mg by mouth daily.        . carvedilol (COREG) 3.125 MG tablet Take 1 tablet (3.125 mg total) by mouth 2 (two) times daily with a meal. Okay to take one additional tablet daily as needed SBP > 150.  70 tablet  11  . cyanocobalamin 100 MCG tablet Take 100 mcg by mouth daily.      . Flaxseed, Linseed, (FLAX SEED OIL PO) Take 1 tablet by mouth daily.      . LevOCARNitine (L-CARNITINE) 250 MG TABS Take 2 tablets by mouth daily.        Marland Kitchen LORazepam (ATIVAN) 0.5 MG tablet Take  0.5 mg by mouth every 8 (eight) hours as needed for anxiety.      . Lutein 20 MG TABS Take 20 mg by mouth daily.      . magnesium gluconate (MAGONATE) 500 MG tablet Take 500 mg by mouth 2 (two) times daily.        . multivitamin (THERAGRAN) per tablet Take 1 tablet by mouth daily.        . nitroGLYCERIN (NITROSTAT) 0.4 MG SL tablet Place 1 tablet (0.4 mg total) under the tongue every 5 (five) minutes as needed for chest pain.  25 tablet  12  . Potassium 99 MG TABS Take 1 tablet by mouth daily.        Marland Kitchen VITAMIN D, CHOLECALCIFEROL, PO Take 1,000 mg by mouth 2 (two) times daily.       No current facility-administered medications for this visit.    Social History History   Social History  . Marital Status: Married    Spouse  Name: N/A    Number of Children: N/A  . Years of Education: N/A   Occupational History  . Not on file.   Social History Main Topics  . Smoking status: Never Smoker   . Smokeless tobacco: Never Used  . Alcohol Use: No  . Drug Use: No  . Sexual Activity: Not on file   Other Topics Concern  . Not on file   Social History Narrative  . No narrative on file     Review of Systems General: No chills, fever, night sweats or weight changes Cardiovascular: No chest pain, dyspnea on exertion, edema, orthopnea, palpitations, paroxysmal nocturnal dyspnea Dermatological: No rash, lesions or masses Respiratory: No cough, dyspnea Urologic: No hematuria, dysuria Abdominal: No nausea, vomiting, diarrhea, bright red blood per rectum, melena, or hematemesis Neurologic: No visual changes, weakness, changes in mental status All other systems reviewed and are otherwise negative except as noted above.  Physical Exam Vitals: Blood pressure 180/86, pulse 64, height 5' 1.81" (1.57 m), weight 84 lb 12.8 oz (38.465 kg).  General: Well developed, well appearing 79 y.o. female in no acute distress. HEENT: Normocephalic, atraumatic. EOMs intact. Sclera nonicteric. Oropharynx clear.  Neck: Supple. No JVD. Lungs: Respirations regular and unlabored, CTA bilaterally. No wheezes, rales or rhonchi. Heart: RRR. S1, S2 present. No murmurs, rub, S3 or S4. Abdomen: Soft, non-distended.  Extremities: No clubbing, cyanosis or edema.  Psych: Normal affect. Neuro: Alert and oriented X 3. Moves all extremities spontaneously.   Diagnostics Electrophysiology study and ablation 05/22/2013  RESULTS: A. Baseline ECG. Baseline ECG demonstrated atrial flutter  with variable AV conduction. Following catheter ablation, the ECG  demonstrated sinus rhythm.  B. Baseline intervals. Sinus node cycle length following ablation was  850 milliseconds. Prior to ablation, the flutter cycle length was 296  milliseconds and the HV  interval was 51 milliseconds. Following  ablation, the QRS duration was 76 milliseconds and the HV interval was  also 51 milliseconds.  C. Rapid ventricular pacing. Following ablation, rapid ventricular  pacing was carried out from the right ventricle demonstrating VA  dissociation at 600 milliseconds.  D. Programmed ventricular stimulation. Programmed ventricular  stimulation was carried out from the right ventricle demonstrating VA  dissociation at 600 milliseconds.  E. Programmed atrial stimulation. Programmed atrial stimulation was  carried out from the atrium at a base drive cycle length of 161  milliseconds. The S1 and S2 interval stepwise decreased from 440  milliseconds down to 300 milliseconds where the AV node ERP was  observed. During programmed atrial stimulation, there were no AH jumps,  no echo beats, no inducible SVT.  F. Rapid atrial pacing. Rapid atrial pacing was carried out following  catheter ablation demonstrating an AV Wenckebach cycle length of 360  milliseconds. Prior to ablation, rapid atrial pacing was carried out at  270 milliseconds in atrial flutter circuit demonstrating concealed  entrainment and following ablation rapid atrial pacing was carried out  down to 240 milliseconds demonstrating no inducible atrial flutter.  G. Arrhythmias observed.  1. Atrial flutter initiation present at the time of EP study. The  duration was sustained, cycle length was 296 milliseconds. Method  of termination was with catheter ablation.  a. Mapping. Mapping of atrial flutter isthmus demonstrated  usual size and orientation.  b. RF energy application. A total of 5 RF energy applications  were delivered resulting in termination of flutter, restoration of  sinus rhythm, creation of bidirectional block and atrial flutter  isthmus.  CONCLUSION: This study demonstrates successful electrophysiologic study  and RF catheter ablation of typical atrial flutter with a total of 5 RF    energy applications to the atrial flutter isthmus rendering the  tachycardia not inducible and terminating atrial flutter.  2D Echo 05/21/2013  Conclusions  - Left ventricle: The cavity size was normal. Wall thickness was increased in a pattern of mild LVH. Systolic function was vigorous. The estimated ejection fraction was in the range of 65% to 70%. Wall motion was normal; there were no regional wall motion abnormalities. - Atrial septum: No defect or patent foramen ovale was identified. - Pericardium, extracardiac: A trivial pericardial effusion was identified posterior to the heart.  12-lead ECG today - SR at 62 bpm; PR 122, QRS 82, QT/QTc 428/434  Assessment and Plan 1. Atrial flutter s/p EPS + RF ablation - in SR - she has no history of AFib - she has completed >4 weeks of anticoagulation; will stop Eliquis today - return to clinic for follow-up with Dr. [name] in 6 weeks (earlier per patient request) 2. HTN - BP elevated today - she reports her BP is always elevated at doctor's offices (and this has been previously mentioned in Dr Leonia Reeves office notes) - she has h/o hypotension with dizziness  - home BP readings reviewed and range 100-134 / 62-80 - she takes higher dose of Coreg if SBP >150, as directed by Dr. [name] - continue medical therapy as previously directed 3. Dizziness - etiology unknown; longstanding for several years with prior work-up - consider carotid dopplers  - follow-up with PCP and Vascular Surgery  Signed, Rick Duff, PA-C 06/23/2013, 10:58 AM

## 2013-08-07 ENCOUNTER — Ambulatory Visit: Payer: Medicare Other | Admitting: Internal Medicine

## 2013-08-18 ENCOUNTER — Ambulatory Visit: Payer: Medicare Other | Admitting: Internal Medicine

## 2013-08-24 ENCOUNTER — Ambulatory Visit (INDEPENDENT_AMBULATORY_CARE_PROVIDER_SITE_OTHER): Payer: Medicare Other | Admitting: Internal Medicine

## 2013-08-24 ENCOUNTER — Encounter: Payer: Self-pay | Admitting: Internal Medicine

## 2013-08-24 VITALS — BP 190/90 | HR 72 | Ht 64.0 in | Wt 85.8 lb

## 2013-08-24 DIAGNOSIS — I10 Essential (primary) hypertension: Secondary | ICD-10-CM

## 2013-08-24 DIAGNOSIS — Z9889 Other specified postprocedural states: Secondary | ICD-10-CM

## 2013-08-24 DIAGNOSIS — Z8679 Personal history of other diseases of the circulatory system: Secondary | ICD-10-CM

## 2013-08-24 DIAGNOSIS — I4892 Unspecified atrial flutter: Secondary | ICD-10-CM

## 2013-08-24 DIAGNOSIS — R42 Dizziness and giddiness: Secondary | ICD-10-CM

## 2013-08-24 NOTE — Assessment & Plan Note (Signed)
She complains of dizziness and I have asked that she hold her coreg as she thinks that it is coreg that is causing her dizziness. If the dizziness gets better with stopping her coreg, then she will try additional medical therapy.

## 2013-08-24 NOTE — Patient Instructions (Signed)
Your physician has recommended you make the following change in your medication:  1) Hold Coreg for one week to see if dizziness improves. If it dizziness improves, then stop Coreg. If dizziness does not improve please call office for further follow up.  Your physician wants you to follow-up in: 1 year with Dr. Lovena Le.  You will receive a reminder letter in the mail two months in advance. If you don't receive a letter, please call our office to schedule the follow-up appointment.

## 2013-08-24 NOTE — Assessment & Plan Note (Signed)
Her blood pressure is elevated in the office but her checks at home show that she is actually well controlled. She notes that constipation makes it worse. I discussed a low sodium diet. We also discussed additional blood pressure options.

## 2013-08-24 NOTE — Progress Notes (Signed)
HPI Mrs. Mirkin returns today for followup. She is a very pleasant 80 yo woman with a h/o atrial flutter s/p catheter ablation. She c/o dizziness, worse with coreg. She notes that her constipation has been her biggest problem and she has her blood pressure increase when she is constipated. No other complaints. Allergies  Allergen Reactions  . Fexofenadine Shortness Of Breath  . Cephalosporins Rash  . Amlodipine   . Anaprox [Naproxen Sodium]   . Benzonatate   . Cephalexin   . Clindamycin/Lincomycin   . Clorazepate   . Codeine   . Diltiazem     Dizziness   . Doxycycline   . Hydralazine   . Labetalol   . Lisinopril   . Phenylephrine   . Phenytoin   . Zolpidem   . Penicillins Nausea And Vomiting, Swelling and Rash     Current Outpatient Prescriptions  Medication Sig Dispense Refill  . Ascorbic Acid (VITAMIN C) 1000 MG tablet Take 1,000 mg by mouth daily.        . carvedilol (COREG) 3.125 MG tablet Take 1 tablet (3.125 mg total) by mouth 2 (two) times daily with a meal. Okay to take one additional tablet daily as needed SBP > 150.  70 tablet  11  . cyanocobalamin 100 MCG tablet Take 100 mcg by mouth daily.      . Flaxseed, Linseed, (FLAX SEED OIL PO) Take 1 tablet by mouth daily.      . LevOCARNitine (L-CARNITINE) 250 MG TABS Take 2 tablets by mouth daily.        Marland Kitchen LORazepam (ATIVAN) 0.5 MG tablet Take 0.5 mg by mouth every 8 (eight) hours as needed for anxiety.      . Lutein 20 MG TABS Take 20 mg by mouth daily.      . magnesium gluconate (MAGONATE) 500 MG tablet Take 500 mg by mouth 2 (two) times daily.        . multivitamin (THERAGRAN) per tablet Take 1 tablet by mouth daily.        . nitroGLYCERIN (NITROSTAT) 0.4 MG SL tablet Place 1 tablet (0.4 mg total) under the tongue every 5 (five) minutes as needed for chest pain.  25 tablet  12  . Potassium 99 MG TABS Take 1 tablet by mouth daily.        Marland Kitchen VITAMIN D, CHOLECALCIFEROL, PO Take 1,000 mg by mouth 2 (two) times  daily.      . vitamin E 400 UNIT capsule Take 400 Units by mouth daily.       No current facility-administered medications for this visit.     Past Medical History  Diagnosis Date  . Hypertension   . Coronary artery disease   . PVD (peripheral vascular disease)   . Peripheral arterial disease   . Dysrhythmia     ATRIAL FLUTTER  . GERD (gastroesophageal reflux disease)   . Cancer     OVARIAN    1967  . Arthritis   . Atrial flutter     ROS:   All systems reviewed and negative except as noted in the HPI.   Past Surgical History  Procedure Laterality Date  . Bpg    . Pr vein bypass graft,aorto-fem-pop      Right   . Gliayte catherter insertion  06/27/10  . Carotid stent  11/30/09    LAD S/P Right CA DES  . Abdominal hysterectomy       History reviewed. No pertinent family history.  History   Social History  . Marital Status: Married    Spouse Name: N/A    Number of Children: N/A  . Years of Education: N/A   Occupational History  . Not on file.   Social History Main Topics  . Smoking status: Never Smoker   . Smokeless tobacco: Never Used  . Alcohol Use: No  . Drug Use: No  . Sexual Activity: Not on file   Other Topics Concern  . Not on file   Social History Narrative  . No narrative on file     BP 190/90  Pulse 72  Ht 5\' 4"  (1.626 m)  Wt 85 lb 12.8 oz (38.919 kg)  BMI 14.72 kg/m2  Physical Exam:  Well appearing 80 yo woman, NAD HEENT: Unremarkable Neck:  No JVD, no thyromegall Back:  No CVA tenderness Lungs:  Clear with no wheezes HEART:  Regular rate rhythm, no murmurs, no rubs, no clicks Abd:  soft, positive bowel sounds, no organomegally, no rebound, no guarding Ext:  2 plus pulses, no edema, no cyanosis, no clubbing Skin:  No rashes no nodules Neuro:  CN II through XII intact, motor grossly intact   Assess/Plan:

## 2013-09-02 ENCOUNTER — Telehealth: Payer: Self-pay | Admitting: Internal Medicine

## 2013-09-02 NOTE — Telephone Encounter (Signed)
Spoke with patient she is going to stay off of the CArvedilol and keep track of her BP's as long as they are good she will follow up as scheduled.  If her BP starts to rise she will call me back to discuss with Dr Lovena Le

## 2013-09-02 NOTE — Telephone Encounter (Signed)
New message   Patient calling back regarding blood pressure this am  130/76 . Has question regarding medication.     Dizziness has improved.

## 2013-12-28 ENCOUNTER — Encounter: Payer: Self-pay | Admitting: Vascular Surgery

## 2013-12-29 ENCOUNTER — Encounter: Payer: Self-pay | Admitting: Vascular Surgery

## 2013-12-29 ENCOUNTER — Ambulatory Visit (INDEPENDENT_AMBULATORY_CARE_PROVIDER_SITE_OTHER): Payer: Medicare Other | Admitting: Vascular Surgery

## 2013-12-29 ENCOUNTER — Ambulatory Visit (HOSPITAL_COMMUNITY)
Admission: RE | Admit: 2013-12-29 | Discharge: 2013-12-29 | Disposition: A | Discharge status: Home / Self Care | Payer: Medicare Other | Source: Ambulatory Visit | Attending: Vascular Surgery | Admitting: Vascular Surgery

## 2013-12-29 ENCOUNTER — Ambulatory Visit (INDEPENDENT_AMBULATORY_CARE_PROVIDER_SITE_OTHER)
Admission: RE | Admit: 2013-12-29 | Discharge: 2013-12-29 | Disposition: A | Discharge status: Home / Self Care | Payer: Medicare Other | Source: Ambulatory Visit | Attending: Vascular Surgery | Admitting: Vascular Surgery

## 2013-12-29 VITALS — BP 199/119 | HR 77 | Ht 64.0 in | Wt 84.4 lb

## 2013-12-29 DIAGNOSIS — Z48812 Encounter for surgical aftercare following surgery on the circulatory system: Secondary | ICD-10-CM

## 2013-12-29 DIAGNOSIS — I739 Peripheral vascular disease, unspecified: Secondary | ICD-10-CM

## 2013-12-29 NOTE — Progress Notes (Signed)
Subjective:     Patient ID: Michelle Gaines, female   DOB: 12-12-1928, 80 y.o.   MRN: [id number]  HPI this 80 year old female returns for continuing followup regarding her right external iliac to profunda femoris bypass performed in 2009 for spheric ischemia of the right leg. She continues to have no specific symptoms of rest pain infection or nonhealing ulcers. She does have claudication but she does not ambulate very long distances. Symptoms in her right and left legs are similar.  Past Medical History  Diagnosis Date  . Hypertension   . Coronary artery disease   . PVD (peripheral vascular disease)   . Peripheral arterial disease   . Dysrhythmia     ATRIAL FLUTTER  . GERD (gastroesophageal reflux disease)   . Cancer     OVARIAN    1967  . Arthritis   . Atrial flutter     History  Substance Use Topics  . Smoking status: Never Smoker   . Smokeless tobacco: Never Used  . Alcohol Use: No    Family History  Problem Relation Age of Onset  . COPD Father   . Hypertension Father     Allergies  Allergen Reactions  . Fexofenadine Shortness Of Breath  . Cephalosporins Rash  . Amlodipine   . Anaprox [Naproxen Sodium]   . Benzonatate   . Cephalexin   . Clindamycin/Lincomycin   . Clorazepate   . Codeine   . Diltiazem     Dizziness   . Doxycycline   . Hydralazine   . Labetalol   . Lisinopril   . Phenylephrine   . Phenytoin   . Zolpidem   . Penicillins Nausea And Vomiting, Swelling and Rash    Current outpatient prescriptions:Ascorbic Acid (VITAMIN C) 1000 MG tablet, Take 1,000 mg by mouth daily.  , Disp: , Rfl: ;  carvedilol (COREG) 3.125 MG tablet, Take 1 tablet (3.125 mg total) by mouth 2 (two) times daily with a meal. Okay to take one additional tablet daily as needed SBP > 150., Disp: 70 tablet, Rfl: 11;  cyanocobalamin 100 MCG tablet, Take 100 mcg by mouth daily., Disp: , Rfl:  Flaxseed, Linseed, (FLAX SEED OIL PO), Take 1 tablet by mouth daily., Disp: , Rfl: ;   LevOCARNitine (L-CARNITINE) 250 MG TABS, Take 2 tablets by mouth daily.  , Disp: , Rfl: ;  LORazepam (ATIVAN) 0.5 MG tablet, Take 0.5 mg by mouth every 8 (eight) hours as needed for anxiety., Disp: , Rfl: ;  Lutein 20 MG TABS, Take 20 mg by mouth daily., Disp: , Rfl:  magnesium gluconate (MAGONATE) 500 MG tablet, Take 500 mg by mouth 2 (two) times daily.  , Disp: , Rfl: ;  multivitamin (THERAGRAN) per tablet, Take 1 tablet by mouth daily.  , Disp: , Rfl: ;  nitroGLYCERIN (NITROSTAT) 0.4 MG SL tablet, Place 1 tablet (0.4 mg total) under the tongue every 5 (five) minutes as needed for chest pain., Disp: 25 tablet, Rfl: 12;  Potassium 99 MG TABS, Take 1 tablet by mouth daily.  , Disp: , Rfl:  VITAMIN D, CHOLECALCIFEROL, PO, Take 1,000 mg by mouth 2 (two) times daily., Disp: , Rfl: ;  vitamin E 400 UNIT capsule, Take 400 Units by mouth daily., Disp: , Rfl:   BP 199/119  Pulse 77  Ht 5\' 4"  (1.626 m)  Wt 84 lb 6.4 oz (38.284 kg)  BMI 14.48 kg/m2  SpO2 98%  Body mass index is 14.48 kg/(m^2).  Review of Systems denies chest pain, this no exertion, PND, orthopnea, hemoptysis, lateralizing weakness, aphasia, amaurosis fugax, diplopia, blurred vision, syncope. All other systems negative a complete review of systems    Objective:   Physical Exam BP 199/119  Pulse 77  Ht 5\' 4"  (1.626 m)  Wt 84 lb 6.4 oz (38.284 kg)  BMI 14.48 kg/m2  SpO2 98%  Gen.-alert and oriented x3 in no apparent distress HEENT normal for age Lungs no rhonchi or wheezing Cardiovascular regular rhythm no murmurs carotid pulses 3+ palpable no bruits audible Abdomen soft nontender no palpable masses Musculoskeletal free of  major deformities Skin clear -no rashes Neurologic normal Lower extremities 3+ femoral pulse on the right absent pulse on the left and femoral level-no distal pulses palpable in either leg. Both feet well perfused.  Today I ordered lower extremity arterial Doppler which reveals no change  from previous study with chronic occlusion of the SFA and bypass graft with good flow into the profunda on the right. ABI on the left is 0.46 on the right port 40 which is unchanged        Assessment:     Stable findings with patent right external iliac to profunda femoris bypass and stable ABIs bilaterally no limiting symptoms    Plan:     Patient return in one year for followup duplex scan of right lower stringy bypass and bilateral ABIs are low she developed sudden loss of sensation in right leg or pain.

## 2014-01-01 ENCOUNTER — Other Ambulatory Visit: Payer: Self-pay

## 2014-01-01 DIAGNOSIS — Z48812 Encounter for surgical aftercare following surgery on the circulatory system: Secondary | ICD-10-CM

## 2014-01-01 DIAGNOSIS — I7092 Chronic total occlusion of artery of the extremities: Secondary | ICD-10-CM

## 2014-06-03 ENCOUNTER — Encounter (HOSPITAL_COMMUNITY): Payer: Self-pay | Admitting: Internal Medicine

## 2014-07-13 DIAGNOSIS — I10 Essential (primary) hypertension: Secondary | ICD-10-CM | POA: Diagnosis not present

## 2014-07-13 DIAGNOSIS — I2510 Atherosclerotic heart disease of native coronary artery without angina pectoris: Secondary | ICD-10-CM | POA: Diagnosis not present

## 2014-07-27 ENCOUNTER — Emergency Department (HOSPITAL_COMMUNITY): Payer: Medicare Other

## 2014-07-27 ENCOUNTER — Encounter (HOSPITAL_COMMUNITY): Payer: Self-pay | Admitting: Emergency Medicine

## 2014-07-27 ENCOUNTER — Inpatient Hospital Stay (HOSPITAL_COMMUNITY)
Admission: EM | Admit: 2014-07-27 | Discharge: 2014-07-30 | DRG: 480 | Disposition: A | Discharge status: Home Health | Payer: Medicare Other | Attending: Internal Medicine | Admitting: Internal Medicine

## 2014-07-27 DIAGNOSIS — Z7982 Long term (current) use of aspirin: Secondary | ICD-10-CM | POA: Diagnosis not present

## 2014-07-27 DIAGNOSIS — I4892 Unspecified atrial flutter: Secondary | ICD-10-CM | POA: Diagnosis present

## 2014-07-27 DIAGNOSIS — T148 Other injury of unspecified body region: Secondary | ICD-10-CM | POA: Diagnosis not present

## 2014-07-27 DIAGNOSIS — Z8249 Family history of ischemic heart disease and other diseases of the circulatory system: Secondary | ICD-10-CM

## 2014-07-27 DIAGNOSIS — I2510 Atherosclerotic heart disease of native coronary artery without angina pectoris: Secondary | ICD-10-CM | POA: Diagnosis not present

## 2014-07-27 DIAGNOSIS — Z825 Family history of asthma and other chronic lower respiratory diseases: Secondary | ICD-10-CM

## 2014-07-27 DIAGNOSIS — Z79899 Other long term (current) drug therapy: Secondary | ICD-10-CM

## 2014-07-27 DIAGNOSIS — M25552 Pain in left hip: Secondary | ICD-10-CM | POA: Diagnosis not present

## 2014-07-27 DIAGNOSIS — Z888 Allergy status to other drugs, medicaments and biological substances status: Secondary | ICD-10-CM

## 2014-07-27 DIAGNOSIS — K219 Gastro-esophageal reflux disease without esophagitis: Secondary | ICD-10-CM | POA: Diagnosis present

## 2014-07-27 DIAGNOSIS — I10 Essential (primary) hypertension: Secondary | ICD-10-CM | POA: Diagnosis not present

## 2014-07-27 DIAGNOSIS — E43 Unspecified severe protein-calorie malnutrition: Secondary | ICD-10-CM | POA: Diagnosis present

## 2014-07-27 DIAGNOSIS — E876 Hypokalemia: Secondary | ICD-10-CM | POA: Diagnosis not present

## 2014-07-27 DIAGNOSIS — Z681 Body mass index (BMI) 19 or less, adult: Secondary | ICD-10-CM | POA: Diagnosis not present

## 2014-07-27 DIAGNOSIS — M25562 Pain in left knee: Secondary | ICD-10-CM | POA: Diagnosis not present

## 2014-07-27 DIAGNOSIS — Z955 Presence of coronary angioplasty implant and graft: Secondary | ICD-10-CM

## 2014-07-27 DIAGNOSIS — Z881 Allergy status to other antibiotic agents status: Secondary | ICD-10-CM | POA: Diagnosis not present

## 2014-07-27 DIAGNOSIS — Y92015 Private garage of single-family (private) house as the place of occurrence of the external cause: Secondary | ICD-10-CM

## 2014-07-27 DIAGNOSIS — G2581 Restless legs syndrome: Secondary | ICD-10-CM

## 2014-07-27 DIAGNOSIS — W19XXXA Unspecified fall, initial encounter: Secondary | ICD-10-CM

## 2014-07-27 DIAGNOSIS — S72142A Displaced intertrochanteric fracture of left femur, initial encounter for closed fracture: Secondary | ICD-10-CM

## 2014-07-27 DIAGNOSIS — S72009A Fracture of unspecified part of neck of unspecified femur, initial encounter for closed fracture: Secondary | ICD-10-CM | POA: Diagnosis present

## 2014-07-27 DIAGNOSIS — Z880 Allergy status to penicillin: Secondary | ICD-10-CM | POA: Diagnosis not present

## 2014-07-27 DIAGNOSIS — Z419 Encounter for procedure for purposes other than remedying health state, unspecified: Secondary | ICD-10-CM

## 2014-07-27 DIAGNOSIS — S8992XA Unspecified injury of left lower leg, initial encounter: Secondary | ICD-10-CM | POA: Diagnosis not present

## 2014-07-27 DIAGNOSIS — R42 Dizziness and giddiness: Secondary | ICD-10-CM

## 2014-07-27 DIAGNOSIS — W108XXA Fall (on) (from) other stairs and steps, initial encounter: Secondary | ICD-10-CM | POA: Diagnosis present

## 2014-07-27 DIAGNOSIS — S299XXA Unspecified injury of thorax, initial encounter: Secondary | ICD-10-CM | POA: Diagnosis not present

## 2014-07-27 HISTORY — PX: FRACTURE SURGERY: SHX138

## 2014-07-27 MED ORDER — MORPHINE SULFATE 4 MG/ML IJ SOLN
4.0000 mg | INTRAMUSCULAR | Status: DC | PRN
  Administered 2014-07-28: 4 mg via INTRAVENOUS
  Filled 2014-07-27 (×2): qty 1

## 2014-07-27 MED ORDER — HYDROCODONE-ACETAMINOPHEN 5-325 MG PO TABS
1.0000 | ORAL_TABLET | ORAL | Status: DC | PRN
  Administered 2014-07-28: 1 via ORAL
  Filled 2014-07-27 (×2): qty 2

## 2014-07-27 MED ORDER — ONDANSETRON HCL 4 MG/2ML IJ SOLN
4.0000 mg | Freq: Once | INTRAMUSCULAR | Status: AC
  Administered 2014-07-28: 4 mg via INTRAVENOUS
  Filled 2014-07-27: qty 2

## 2014-07-27 NOTE — ED Provider Notes (Signed)
CSN: [id number]     Arrival date & time 07/27/14  2243 History  This chart was scribed for Kalman Drape, MD by Delphia Grates, ED Scribe. This patient was seen in room D36C/D36C and the patient's care was started at 11:48 PM.   Chief Complaint  Patient presents with  . Fall    The history is provided by the patient. No language interpreter was used.     HPI Comments: Michelle Gaines is a 81 y.o. female brought in by ambulance, who presents to the Emergency Department complaining of fall that occurred PTA. Patient states she lost her balance while walking up steps in her garage and fell "hard" on cement, landing on her left leg. She denies any precipitating factors prior to fall, and also denies head injury or LOC. There is associated left knee pain and left hip pain. She reports history of stent placement and catheterization. She denies any other injuries.  Ortho: Dr. Lorin Mercy  Past Medical History  Diagnosis Date  . Hypertension   . Coronary artery disease   . PVD (peripheral vascular disease)   . Peripheral arterial disease   . Dysrhythmia     ATRIAL FLUTTER  . GERD (gastroesophageal reflux disease)   . Cancer     OVARIAN    1967  . Arthritis   . Atrial flutter    Past Surgical History  Procedure Laterality Date  . Bpg    . Pr vein bypass graft,[id number]-pop      Right   . Gliayte catherter insertion  06/27/10  . Carotid stent  11/30/09    LAD S/P Right CA DES  . Abdominal hysterectomy    . Atrial flutter ablation N/A 05/22/2013    Procedure: ATRIAL FLUTTER ABLATION;  Surgeon: Evans Lance, MD;  Location: [facility] CATH LAB;  Service: Cardiovascular;  Laterality: N/A;   Family History  Problem Relation Age of Onset  . COPD Father   . Hypertension Father    History  Substance Use Topics  . Smoking status: Never Smoker   . Smokeless tobacco: Never Used  . Alcohol Use: No   OB History    No data available     Review of Systems  Constitutional: Negative for fever.   Musculoskeletal: Positive for arthralgias (left knee; left hjip).  Neurological: Negative for dizziness, syncope and headaches.      Allergies  Fexofenadine; Cephalosporins; Amlodipine; Anaprox; Benzonatate; Cephalexin; Clindamycin/lincomycin; Clorazepate; Codeine; Diltiazem; Doxycycline; Hydralazine; Labetalol; Lisinopril; Phenylephrine; Phenytoin; Zolpidem; and Penicillins  Home Medications   Prior to Admission medications   Medication Sig Start Date End Date Taking? Authorizing Provider  Ascorbic Acid (VITAMIN C) 1000 MG tablet Take 1,000 mg by mouth daily.      Historical Provider, MD  carvedilol (COREG) 3.125 MG tablet Take 1 tablet (3.125 mg total) by mouth 2 (two) times daily with a meal. Okay to take one additional tablet daily as needed SBP > 150. 05/23/13   Rhonda G Barrett, PA-C  cyanocobalamin 100 MCG tablet Take 100 mcg by mouth daily.    Historical Provider, MD  Flaxseed, Linseed, (FLAX SEED OIL PO) Take 1 tablet by mouth daily.    Historical Provider, MD  LevOCARNitine (L-CARNITINE) 250 MG TABS Take 2 tablets by mouth daily.      Historical Provider, MD  LORazepam (ATIVAN) 0.5 MG tablet Take 0.5 mg by mouth every 8 (eight) hours as needed for anxiety.    Historical Provider, MD  Lutein 20 MG TABS Take 20 mg  by mouth daily.    Historical Provider, MD  magnesium gluconate (MAGONATE) 500 MG tablet Take 500 mg by mouth 2 (two) times daily.      Historical Provider, MD  multivitamin [facility]) per tablet Take 1 tablet by mouth daily.      Historical Provider, MD  nitroGLYCERIN (NITROSTAT) 0.4 MG SL tablet Place 1 tablet (0.4 mg total) under the tongue every 5 (five) minutes as needed for chest pain. 05/23/13   Rhonda G Barrett, PA-C  Potassium 99 MG TABS Take 1 tablet by mouth daily.      Historical Provider, MD  VITAMIN D, CHOLECALCIFEROL, PO Take 1,000 mg by mouth 2 (two) times daily.    Historical Provider, MD  vitamin E 400 UNIT capsule Take 400 Units by mouth daily.     Historical Provider, MD   Triage Vitals: BP 176/95 mmHg  Pulse 52  Temp(Src) 97.8 F (36.6 C) (Oral)  Resp 17  Ht 5\' 4"  (1.626 m)  Wt 84 lb (38.102 kg)  BMI 14.41 kg/m2  SpO2 99%  Physical Exam  Constitutional: She is oriented to person, place, and time. She appears well-developed and well-nourished. She appears distressed (uncomfortable appearing).  HENT:  Head: Normocephalic and atraumatic.  Nose: Nose normal.  Mouth/Throat: Oropharynx is clear and moist.  Eyes: Conjunctivae and EOM are normal.  Neck: Normal range of motion. Neck supple. No JVD present. No tracheal deviation present. No thyromegaly present.  Cardiovascular: Normal rate, regular rhythm, normal heart sounds and intact distal pulses.  Exam reveals no gallop and no friction rub.   No murmur heard. Pulmonary/Chest: Effort normal and breath sounds normal. No stridor. No respiratory distress. She has no wheezes. She has no rales. She exhibits no tenderness.  Abdominal: Soft. Bowel sounds are normal. She exhibits no distension and no mass. There is no tenderness. There is no rebound and no guarding.  Musculoskeletal: She exhibits tenderness.  Left leg is slightly shortened and externally rotated.  Pulses are normal.  Sensation is normal.  She has normal movement of toes and ankle.  Patient complains of pain to the medial aspect of the inferior pole of the patella.  There is no effusion or crepitus or contusion to this area.  She has significant pain with any attempt of range of motion of the left leg at the hip.  Lymphadenopathy:    She has no cervical adenopathy.  Neurological: She is alert and oriented to person, place, and time. She exhibits normal muscle tone. Coordination normal.  Skin: Skin is warm and dry. No rash noted. No erythema. No pallor.  Patient is jaundiced appearing  Psychiatric: She has a normal mood and affect. Her behavior is normal. Judgment and thought content normal.  Nursing note and vitals  reviewed.   ED Course  Procedures (including critical care time)  DIAGNOSTIC STUDIES: Oxygen Saturation is 99% on room air, normal by my interpretation.    COORDINATION OF CARE: At 2359 Discussed treatment plan with patient which includes pain medication. Patient agrees.   Labs Review Labs Reviewed  CBC WITH DIFFERENTIAL/PLATELET - Abnormal; Notable for the following:    RBC 3.82 (*)    Platelets 134 (*)    Neutrophils Relative % 85 (*)    Neutro Abs 7.9 (*)    Lymphocytes Relative 9 (*)    All other components within normal limits  COMPREHENSIVE METABOLIC PANEL - Abnormal; Notable for the following:    Potassium 3.0 (*)    Glucose, Bld 157 (*)  GFR calc non Af Amer 58 (*)    GFR calc Af Amer 68 (*)    All other components within normal limits  PROTIME-INR  URINALYSIS, ROUTINE W REFLEX MICROSCOPIC  TYPE AND SCREEN    Imaging Review Dg Knee Complete 4 Views Left  07/27/2014   CLINICAL DATA:  Left knee pain after fall.  EXAM: LEFT KNEE - COMPLETE 4+ VIEW  COMPARISON:  None.  FINDINGS: No acute fracture or dislocation. There is osteoarthritis in the lateral tibial femoral compartment with possible remote prior injury to the lateral tibial plateau. The bones are under mineralized. There is no joint effusion or localizing soft tissue abnormality. Vascular calcifications are seen.  IMPRESSION: No fracture or dislocation of the left knee.   Electronically Signed   By: Jeb Levering M.D.   On: 07/27/2014 23:47   Dg Hip Unilat With Pelvis 2-3 Views Left  07/27/2014   CLINICAL DATA:  Status post fall in garage, with acute onset of left hip pain. Initial encounter.  EXAM: LEFT HIP (WITH PELVIS) 2-3 VIEWS  COMPARISON:  None.  FINDINGS: There is a mildly comminuted left femoral intertrochanteric fracture. Mild lateral displacement and rotation of the distal femur is noted. The left femoral head remains seated at the acetabulum. The right hip joint is unremarkable in appearance.   Degenerative change is noted at the lower lumbar spine and at the sacroiliac joints. The visualized bowel gas pattern is grossly unremarkable, with a stool ball noted at the rectum. Scattered vascular calcifications are seen.  IMPRESSION: 1. Mildly comminuted left femoral intertrochanteric fracture, with mild lateral displacement and rotation of the distal femur. 2. Scattered vascular calcifications seen.   Electronically Signed   By: Garald Balding M.D.   On: 07/27/2014 23:50     EKG Interpretation   Date/Time:  Wednesday July 28 2014 00:37:29 EST Ventricular Rate:  57 PR Interval:  173 QRS Duration: 91 QT Interval:  535 QTC Calculation: 521 R Axis:   75 Text Interpretation:  Sinus rhythm Nonspecific T abnormalities, lateral  leads Prolonged QT interval Baseline wander in lead(s) V3 Confirmed by  Jheremy Boger  MD, Brittyn Salaz ([id number]) on 07/28/2014 1:08:09 AM      MDM   Final diagnoses:  Fall  Fracture, intertrochanteric, left femur, closed, initial encounter  Essential hypertension    81 year old female with mechanical fall, twisting she got out of the car.  She did not strike her head, no LOC.  Patient complains of left hip pain and left medial knee pain.  Patient has history of coronary disease status post stent, peripheral vascular disease, a flutter, status post ablation.  Patient has intertrochanteric fracture of the left hip.  Patient requests surgery to be done by Dr. Lorin Mercy who is done multiple surgeries to herself and family members.  Labs are pending.  Will discuss with hospitalist for admission and Orthopedics for their consult.   I personally performed the services described in this documentation, which was scribed in my presence. The recorded information has been reviewed and is accurate.  1:08 AM Case discussed with Dr. Lorin Mercy will see the patient in consult tomorrow, plans on surgery tomorrow as well.  Kalman Drape, MD 07/28/14 [phone]

## 2014-07-27 NOTE — ED Notes (Signed)
Pt to ED via [facility] for evaluation of a fall from standing. Pt reports while walking up steps in her garage she lost her balance and fell onto the left side of her body- pt currently admits to left knee and hip pain.  EMS gave 54mcg Fentanyl and 4mg  Zofran which relieved pain.  No obvious shortening or rotation noted to leg, distal pulses present- denies other injuries.

## 2014-07-28 ENCOUNTER — Inpatient Hospital Stay (HOSPITAL_COMMUNITY): Payer: Medicare Other | Admitting: Certified Registered"

## 2014-07-28 ENCOUNTER — Encounter (HOSPITAL_COMMUNITY)
Admission: EM | Disposition: A | Discharge status: Home Health | Payer: Self-pay | Source: Home / Self Care | Attending: Internal Medicine

## 2014-07-28 ENCOUNTER — Encounter (HOSPITAL_COMMUNITY): Payer: Self-pay | Admitting: Certified Registered"

## 2014-07-28 ENCOUNTER — Inpatient Hospital Stay (HOSPITAL_COMMUNITY): Payer: Medicare Other

## 2014-07-28 DIAGNOSIS — I483 Typical atrial flutter: Secondary | ICD-10-CM

## 2014-07-28 DIAGNOSIS — S72009A Fracture of unspecified part of neck of unspecified femur, initial encounter for closed fracture: Secondary | ICD-10-CM | POA: Diagnosis present

## 2014-07-28 DIAGNOSIS — S72001S Fracture of unspecified part of neck of right femur, sequela: Secondary | ICD-10-CM

## 2014-07-28 DIAGNOSIS — Z8249 Family history of ischemic heart disease and other diseases of the circulatory system: Secondary | ICD-10-CM | POA: Diagnosis not present

## 2014-07-28 DIAGNOSIS — Z955 Presence of coronary angioplasty implant and graft: Secondary | ICD-10-CM | POA: Diagnosis not present

## 2014-07-28 DIAGNOSIS — Z825 Family history of asthma and other chronic lower respiratory diseases: Secondary | ICD-10-CM | POA: Diagnosis not present

## 2014-07-28 DIAGNOSIS — I4892 Unspecified atrial flutter: Secondary | ICD-10-CM | POA: Diagnosis not present

## 2014-07-28 DIAGNOSIS — I2510 Atherosclerotic heart disease of native coronary artery without angina pectoris: Secondary | ICD-10-CM

## 2014-07-28 DIAGNOSIS — I10 Essential (primary) hypertension: Secondary | ICD-10-CM | POA: Diagnosis present

## 2014-07-28 DIAGNOSIS — K219 Gastro-esophageal reflux disease without esophagitis: Secondary | ICD-10-CM | POA: Diagnosis present

## 2014-07-28 DIAGNOSIS — W108XXA Fall (on) (from) other stairs and steps, initial encounter: Secondary | ICD-10-CM | POA: Diagnosis present

## 2014-07-28 DIAGNOSIS — I25119 Atherosclerotic heart disease of native coronary artery with unspecified angina pectoris: Secondary | ICD-10-CM

## 2014-07-28 DIAGNOSIS — Z880 Allergy status to penicillin: Secondary | ICD-10-CM | POA: Diagnosis not present

## 2014-07-28 DIAGNOSIS — Z881 Allergy status to other antibiotic agents status: Secondary | ICD-10-CM | POA: Diagnosis not present

## 2014-07-28 DIAGNOSIS — Z7982 Long term (current) use of aspirin: Secondary | ICD-10-CM | POA: Diagnosis not present

## 2014-07-28 DIAGNOSIS — E43 Unspecified severe protein-calorie malnutrition: Secondary | ICD-10-CM | POA: Diagnosis not present

## 2014-07-28 DIAGNOSIS — Z681 Body mass index (BMI) 19 or less, adult: Secondary | ICD-10-CM | POA: Diagnosis not present

## 2014-07-28 DIAGNOSIS — S72009S Fracture of unspecified part of neck of unspecified femur, sequela: Secondary | ICD-10-CM | POA: Diagnosis not present

## 2014-07-28 DIAGNOSIS — Z79899 Other long term (current) drug therapy: Secondary | ICD-10-CM | POA: Diagnosis not present

## 2014-07-28 DIAGNOSIS — Y92015 Private garage of single-family (private) house as the place of occurrence of the external cause: Secondary | ICD-10-CM | POA: Diagnosis not present

## 2014-07-28 DIAGNOSIS — I739 Peripheral vascular disease, unspecified: Secondary | ICD-10-CM | POA: Diagnosis not present

## 2014-07-28 DIAGNOSIS — Z4789 Encounter for other orthopedic aftercare: Secondary | ICD-10-CM | POA: Diagnosis not present

## 2014-07-28 DIAGNOSIS — S72142A Displaced intertrochanteric fracture of left femur, initial encounter for closed fracture: Secondary | ICD-10-CM | POA: Diagnosis not present

## 2014-07-28 DIAGNOSIS — M25552 Pain in left hip: Secondary | ICD-10-CM | POA: Diagnosis present

## 2014-07-28 DIAGNOSIS — S72132A Displaced apophyseal fracture of left femur, initial encounter for closed fracture: Secondary | ICD-10-CM | POA: Diagnosis not present

## 2014-07-28 DIAGNOSIS — E876 Hypokalemia: Secondary | ICD-10-CM | POA: Diagnosis present

## 2014-07-28 DIAGNOSIS — Z888 Allergy status to other drugs, medicaments and biological substances status: Secondary | ICD-10-CM | POA: Diagnosis not present

## 2014-07-28 HISTORY — PX: INTRAMEDULLARY (IM) NAIL INTERTROCHANTERIC: SHX5875

## 2014-07-28 LAB — COMPREHENSIVE METABOLIC PANEL
ALBUMIN: 4.1 g/dL (ref 3.5–5.2)
ALK PHOS: 58 U/L (ref 39–117)
ALT: 24 U/L (ref 0–35)
ALT: 22 U/L (ref 0–35)
AST: 28 U/L (ref 0–37)
AST: 32 U/L (ref 0–37)
Albumin: 3.8 g/dL (ref 3.5–5.2)
Alkaline Phosphatase: 61 U/L (ref 39–117)
Anion gap: 8 (ref 5–15)
Anion gap: 8 (ref 5–15)
BUN: 21 mg/dL (ref 6–23)
BUN: 22 mg/dL (ref 6–23)
CALCIUM: 8.9 mg/dL (ref 8.4–10.5)
CO2: 27 mmol/L (ref 19–32)
CO2: 29 mmol/L (ref 19–32)
Calcium: 9.2 mg/dL (ref 8.4–10.5)
Chloride: 103 mmol/L (ref 96–112)
Chloride: 103 mmol/L (ref 96–112)
Creatinine, Ser: 0.89 mg/dL (ref 0.50–1.10)
Creatinine, Ser: 0.92 mg/dL (ref 0.50–1.10)
GFR calc Af Amer: 68 mL/min — ABNORMAL LOW (ref >90)
GFR calc Af Amer: 65 mL/min — ABNORMAL LOW (ref >90)
GFR calc non Af Amer: 56 mL/min — ABNORMAL LOW (ref >90)
GFR, EST NON AFRICAN AMERICAN: 58 mL/min — AB (ref >90)
GLUCOSE: 157 mg/dL — AB (ref 70–99)
Glucose, Bld: 157 mg/dL — ABNORMAL HIGH (ref 70–99)
Potassium: 3 mmol/L — ABNORMAL LOW (ref 3.5–5.1)
Potassium: 3.2 mmol/L — ABNORMAL LOW (ref 3.5–5.1)
SODIUM: 140 mmol/L (ref 135–145)
Sodium: 138 mmol/L (ref 135–145)
TOTAL PROTEIN: 6.7 g/dL (ref 6.0–8.3)
Total Bilirubin: 0.9 mg/dL (ref 0.3–1.2)
Total Bilirubin: 1 mg/dL (ref 0.3–1.2)
Total Protein: 6.3 g/dL (ref 6.0–8.3)

## 2014-07-28 LAB — CBC WITH DIFFERENTIAL/PLATELET
BASOS ABS: 0 10*3/uL (ref 0.0–0.1)
Basophils Absolute: 0 10*3/uL (ref 0.0–0.1)
Basophils Relative: 0 % (ref 0–1)
Basophils Relative: 0 % (ref 0–1)
EOS ABS: 0 10*3/uL (ref 0.0–0.7)
Eosinophils Absolute: 0.1 10*3/uL (ref 0.0–0.7)
Eosinophils Relative: 0 % (ref 0–5)
Eosinophils Relative: 1 % (ref 0–5)
HCT: 33.5 % — ABNORMAL LOW (ref 36.0–46.0)
HCT: 37.1 % (ref 36.0–46.0)
Hemoglobin: 11.2 g/dL — ABNORMAL LOW (ref 12.0–15.0)
Hemoglobin: 12.2 g/dL (ref 12.0–15.0)
LYMPHS ABS: 0.9 10*3/uL (ref 0.7–4.0)
LYMPHS PCT: 10 % — AB (ref 12–46)
Lymphocytes Relative: 9 % — ABNORMAL LOW (ref 12–46)
Lymphs Abs: 0.8 10*3/uL (ref 0.7–4.0)
MCH: 31.7 pg (ref 26.0–34.0)
MCH: 31.9 pg (ref 26.0–34.0)
MCHC: 32.9 g/dL (ref 30.0–36.0)
MCHC: 33.4 g/dL (ref 30.0–36.0)
MCV: 94.9 fL (ref 78.0–100.0)
MCV: 97.1 fL (ref 78.0–100.0)
MONOS PCT: 4 % (ref 3–12)
Monocytes Absolute: 0.4 10*3/uL (ref 0.1–1.0)
Monocytes Absolute: 0.4 10*3/uL (ref 0.1–1.0)
Monocytes Relative: 5 % (ref 3–12)
NEUTROS ABS: 7.6 10*3/uL (ref 1.7–7.7)
NEUTROS ABS: 7.9 10*3/uL — AB (ref 1.7–7.7)
NEUTROS PCT: 86 % — AB (ref 43–77)
Neutrophils Relative %: 85 % — ABNORMAL HIGH (ref 43–77)
Platelets: 132 10*3/uL — ABNORMAL LOW (ref 150–400)
Platelets: 134 10*3/uL — ABNORMAL LOW (ref 150–400)
RBC: 3.53 MIL/uL — ABNORMAL LOW (ref 3.87–5.11)
RBC: 3.82 MIL/uL — AB (ref 3.87–5.11)
RDW: 13.5 % (ref 11.5–15.5)
RDW: 13.5 % (ref 11.5–15.5)
WBC: 8.9 10*3/uL (ref 4.0–10.5)
WBC: 9.3 10*3/uL (ref 4.0–10.5)

## 2014-07-28 LAB — TYPE AND SCREEN
ABO/RH(D): O POS
ANTIBODY SCREEN: NEGATIVE

## 2014-07-28 LAB — URINALYSIS, ROUTINE W REFLEX MICROSCOPIC
Bilirubin Urine: NEGATIVE
Glucose, UA: NEGATIVE mg/dL
Hgb urine dipstick: NEGATIVE
KETONES UR: NEGATIVE mg/dL
Leukocytes, UA: NEGATIVE
NITRITE: NEGATIVE
PH: 8 (ref 5.0–8.0)
Protein, ur: NEGATIVE mg/dL
SPECIFIC GRAVITY, URINE: 1.01 (ref 1.005–1.030)
UROBILINOGEN UA: 0.2 mg/dL (ref 0.0–1.0)

## 2014-07-28 LAB — PROTIME-INR
INR: 1.1 (ref 0.00–1.49)
Prothrombin Time: 14.4 seconds (ref 11.6–15.2)

## 2014-07-28 LAB — MAGNESIUM: Magnesium: 2.1 mg/dL (ref 1.5–2.5)

## 2014-07-28 SURGERY — FIXATION, FRACTURE, INTERTROCHANTERIC, WITH INTRAMEDULLARY ROD
Anesthesia: General | Site: Hip | Laterality: Left

## 2014-07-28 MED ORDER — POTASSIUM CHLORIDE 10 MEQ/100ML IV SOLN
10.0000 meq | INTRAVENOUS | Status: DC
  Administered 2014-07-28: 10 meq via INTRAVENOUS
  Filled 2014-07-28: qty 100

## 2014-07-28 MED ORDER — POLYETHYLENE GLYCOL 3350 17 G PO PACK: 17.0000 g | PACK | Freq: Every day | ORAL | Status: DC | PRN

## 2014-07-28 MED ORDER — PHENOL 1.4 % MT LIQD: 1.0000 | OROMUCOSAL | Status: DC | PRN

## 2014-07-28 MED ORDER — PROPOFOL 10 MG/ML IV BOLUS
INTRAVENOUS | Status: AC
  Filled 2014-07-28: qty 20

## 2014-07-28 MED ORDER — VANCOMYCIN HCL IN DEXTROSE 1-5 GM/200ML-% IV SOLN
INTRAVENOUS | Status: AC
  Filled 2014-07-28: qty 200

## 2014-07-28 MED ORDER — HYDRALAZINE HCL 20 MG/ML IJ SOLN
INTRAMUSCULAR | Status: AC
  Filled 2014-07-28: qty 1

## 2014-07-28 MED ORDER — DOCUSATE SODIUM 100 MG PO CAPS
100.0000 mg | ORAL_CAPSULE | Freq: Two times a day (BID) | ORAL | Status: DC
  Administered 2014-07-28 – 2014-07-30 (×4): 100 mg via ORAL
  Filled 2014-07-28 (×5): qty 1

## 2014-07-28 MED ORDER — ACETAMINOPHEN 650 MG RE SUPP: 650.0000 mg | Freq: Four times a day (QID) | RECTAL | Status: DC | PRN

## 2014-07-28 MED ORDER — DEXAMETHASONE SODIUM PHOSPHATE 4 MG/ML IJ SOLN
INTRAMUSCULAR | Status: AC
  Filled 2014-07-28: qty 1

## 2014-07-28 MED ORDER — VITAMIN D3 25 MCG (1000 UNIT) PO TABS
1000.0000 [IU] | ORAL_TABLET | Freq: Two times a day (BID) | ORAL | Status: DC
  Administered 2014-07-28 – 2014-07-30 (×4): 1000 [IU] via ORAL
  Filled 2014-07-28 (×5): qty 1

## 2014-07-28 MED ORDER — ROCURONIUM BROMIDE 50 MG/5ML IV SOLN
INTRAVENOUS | Status: AC
  Filled 2014-07-28: qty 1

## 2014-07-28 MED ORDER — CEFAZOLIN SODIUM-DEXTROSE 2-3 GM-% IV SOLR
INTRAVENOUS | Status: DC | PRN
  Administered 2014-07-28: 2 g via INTRAVENOUS

## 2014-07-28 MED ORDER — ONDANSETRON HCL 4 MG/2ML IJ SOLN
4.0000 mg | Freq: Four times a day (QID) | INTRAMUSCULAR | Status: DC | PRN
  Administered 2014-07-28: 4 mg via INTRAVENOUS
  Filled 2014-07-28: qty 2

## 2014-07-28 MED ORDER — ONDANSETRON HCL 4 MG/2ML IJ SOLN
4.0000 mg | Freq: Once | INTRAMUSCULAR | Status: AC | PRN
  Administered 2014-07-28: 4 mg via INTRAVENOUS

## 2014-07-28 MED ORDER — ONDANSETRON HCL 4 MG/2ML IJ SOLN
INTRAMUSCULAR | Status: DC | PRN
Start: 2014-07-28 — End: 2014-07-28
  Administered 2014-07-28: 4 mg via INTRAVENOUS

## 2014-07-28 MED ORDER — ONDANSETRON HCL 4 MG/2ML IJ SOLN
INTRAMUSCULAR | Status: AC
  Filled 2014-07-28: qty 2

## 2014-07-28 MED ORDER — MORPHINE SULFATE 2 MG/ML IJ SOLN
0.5000 mg | INTRAMUSCULAR | Status: DC | PRN
Start: 2014-07-28 — End: 2014-07-30

## 2014-07-28 MED ORDER — PROMETHAZINE HCL 25 MG/ML IJ SOLN
12.5000 mg | Freq: Four times a day (QID) | INTRAMUSCULAR | Status: DC | PRN
  Administered 2014-07-28: 12.5 mg via INTRAVENOUS
  Filled 2014-07-28: qty 1

## 2014-07-28 MED ORDER — EPHEDRINE SULFATE 50 MG/ML IJ SOLN
INTRAMUSCULAR | Status: DC | PRN
  Administered 2014-07-28: 20 mg via INTRAVENOUS
  Administered 2014-07-28: 10 mg via INTRAVENOUS

## 2014-07-28 MED ORDER — METOCLOPRAMIDE HCL 10 MG PO TABS: 5.0000 mg | ORAL_TABLET | Freq: Three times a day (TID) | ORAL | Status: DC | PRN

## 2014-07-28 MED ORDER — NITROGLYCERIN 0.4 MG SL SUBL
0.4000 mg | SUBLINGUAL_TABLET | SUBLINGUAL | Status: DC | PRN
Start: 2014-07-28 — End: 2014-07-30

## 2014-07-28 MED ORDER — HYDROMORPHONE HCL 1 MG/ML IJ SOLN
0.2500 mg | INTRAMUSCULAR | Status: DC | PRN
  Administered 2014-07-28 (×3): 0.25 mg via INTRAVENOUS

## 2014-07-28 MED ORDER — BUPIVACAINE HCL (PF) 0.5 % IJ SOLN
INTRAMUSCULAR | Status: DC | PRN
  Administered 2014-07-28: 10 mL

## 2014-07-28 MED ORDER — BISACODYL 10 MG RE SUPP: 10.0000 mg | Freq: Every day | RECTAL | Status: DC | PRN

## 2014-07-28 MED ORDER — ACETAMINOPHEN 325 MG PO TABS
650.0000 mg | ORAL_TABLET | Freq: Four times a day (QID) | ORAL | Status: DC | PRN
Start: 2014-07-28 — End: 2014-07-30

## 2014-07-28 MED ORDER — VITAMIN D (CHOLECALCIFEROL) 25 MCG (1000 UT) PO TABS: 1000.0000 mg | ORAL_TABLET | Freq: Two times a day (BID) | ORAL | Status: DC

## 2014-07-28 MED ORDER — MENTHOL 3 MG MT LOZG: 1.0000 | LOZENGE | OROMUCOSAL | Status: DC | PRN

## 2014-07-28 MED ORDER — HEPARIN SODIUM (PORCINE) 5000 UNIT/ML IJ SOLN
5000.0000 [IU] | Freq: Three times a day (TID) | INTRAMUSCULAR | Status: DC
  Filled 2014-07-28 (×6): qty 1

## 2014-07-28 MED ORDER — HYDROCODONE-ACETAMINOPHEN 5-325 MG PO TABS
1.0000 | ORAL_TABLET | Freq: Four times a day (QID) | ORAL | Status: DC | PRN
  Administered 2014-07-29 (×2): 2 via ORAL
  Filled 2014-07-28: qty 1
  Filled 2014-07-28 (×2): qty 2

## 2014-07-28 MED ORDER — MORPHINE SULFATE 2 MG/ML IJ SOLN
2.0000 mg | INTRAMUSCULAR | Status: DC | PRN
  Administered 2014-07-28: 2 mg via INTRAVENOUS
  Filled 2014-07-28: qty 1

## 2014-07-28 MED ORDER — POTASSIUM 99 MG PO TABS
1.0000 | ORAL_TABLET | Freq: Every day | ORAL | Status: DC
Start: 2014-07-28 — End: 2014-07-28

## 2014-07-28 MED ORDER — ONDANSETRON HCL 4 MG PO TABS: 4.0000 mg | ORAL_TABLET | Freq: Four times a day (QID) | ORAL | Status: DC | PRN

## 2014-07-28 MED ORDER — LIDOCAINE HCL (CARDIAC) 20 MG/ML IV SOLN
INTRAVENOUS | Status: DC | PRN
  Administered 2014-07-28: 100 mg via INTRAVENOUS

## 2014-07-28 MED ORDER — FENTANYL CITRATE 0.05 MG/ML IJ SOLN
INTRAMUSCULAR | Status: DC | PRN
  Administered 2014-07-28: 100 ug via INTRAVENOUS
  Administered 2014-07-28: 50 ug via INTRAVENOUS

## 2014-07-28 MED ORDER — BUPIVACAINE-EPINEPHRINE (PF) 0.5% -1:200000 IJ SOLN
INTRAMUSCULAR | Status: AC
  Filled 2014-07-28: qty 30

## 2014-07-28 MED ORDER — LIDOCAINE HCL (CARDIAC) 20 MG/ML IV SOLN
INTRAVENOUS | Status: AC
  Filled 2014-07-28: qty 5

## 2014-07-28 MED ORDER — EPHEDRINE SULFATE 50 MG/ML IJ SOLN
INTRAMUSCULAR | Status: AC
  Filled 2014-07-28: qty 1

## 2014-07-28 MED ORDER — FLEET ENEMA 7-19 GM/118ML RE ENEM: 1.0000 | ENEMA | Freq: Once | RECTAL | Status: AC | PRN

## 2014-07-28 MED ORDER — POTASSIUM CHLORIDE IN NACL 20-0.9 MEQ/L-% IV SOLN
INTRAVENOUS | Status: DC
  Administered 2014-07-28 (×2): via INTRAVENOUS
  Filled 2014-07-28 (×7): qty 1000

## 2014-07-28 MED ORDER — LORAZEPAM 0.5 MG PO TABS: 0.5000 mg | ORAL_TABLET | Freq: Three times a day (TID) | ORAL | Status: DC | PRN

## 2014-07-28 MED ORDER — DEXAMETHASONE SODIUM PHOSPHATE 4 MG/ML IJ SOLN
INTRAMUSCULAR | Status: DC | PRN
  Administered 2014-07-28: 4 mg via INTRAVENOUS

## 2014-07-28 MED ORDER — CARVEDILOL 3.125 MG PO TABS
3.1250 mg | ORAL_TABLET | Freq: Two times a day (BID) | ORAL | Status: DC
  Administered 2014-07-28 – 2014-07-30 (×5): 3.125 mg via ORAL
  Filled 2014-07-28 (×9): qty 1

## 2014-07-28 MED ORDER — FENTANYL CITRATE 0.05 MG/ML IJ SOLN
INTRAMUSCULAR | Status: AC
  Filled 2014-07-28: qty 5

## 2014-07-28 MED ORDER — PROPOFOL 10 MG/ML IV BOLUS
INTRAVENOUS | Status: DC | PRN
  Administered 2014-07-28: 100 mg via INTRAVENOUS

## 2014-07-28 MED ORDER — ONDANSETRON HCL 4 MG/2ML IJ SOLN: 4.0000 mg | Freq: Four times a day (QID) | INTRAMUSCULAR | Status: DC | PRN

## 2014-07-28 MED ORDER — SUCCINYLCHOLINE CHLORIDE 20 MG/ML IJ SOLN
INTRAMUSCULAR | Status: DC | PRN
  Administered 2014-07-28: 100 mg via INTRAVENOUS

## 2014-07-28 MED ORDER — MEPERIDINE HCL 25 MG/ML IJ SOLN: 6.2500 mg | INTRAMUSCULAR | Status: DC | PRN

## 2014-07-28 MED ORDER — SODIUM CHLORIDE 0.9 % IJ SOLN: 3.0000 mL | Freq: Two times a day (BID) | INTRAMUSCULAR | Status: DC

## 2014-07-28 MED ORDER — HYDROMORPHONE HCL 1 MG/ML IJ SOLN
INTRAMUSCULAR | Status: AC
  Filled 2014-07-28: qty 1

## 2014-07-28 MED ORDER — LUTEIN 20 MG PO TABS: 20.0000 mg | ORAL_TABLET | Freq: Every day | ORAL | Status: DC

## 2014-07-28 MED ORDER — HEPARIN SODIUM (PORCINE) 5000 UNIT/ML IJ SOLN
5000.0000 [IU] | Freq: Three times a day (TID) | INTRAMUSCULAR | Status: DC
  Filled 2014-07-28 (×3): qty 1

## 2014-07-28 MED ORDER — VITAMIN B-12 100 MCG PO TABS
100.0000 ug | ORAL_TABLET | Freq: Every day | ORAL | Status: DC
  Administered 2014-07-29 – 2014-07-30 (×2): 100 ug via ORAL
  Filled 2014-07-28 (×2): qty 1

## 2014-07-28 MED ORDER — LACTATED RINGERS IV SOLN
INTRAVENOUS | Status: DC
  Administered 2014-07-28: 17:00:00 via INTRAVENOUS

## 2014-07-28 MED ORDER — HYDRALAZINE HCL 20 MG/ML IJ SOLN
5.0000 mg | Freq: Once | INTRAMUSCULAR | Status: AC
  Administered 2014-07-28: 5 mg via INTRAVENOUS

## 2014-07-28 MED ORDER — METOCLOPRAMIDE HCL 5 MG/ML IJ SOLN: 5.0000 mg | Freq: Three times a day (TID) | INTRAMUSCULAR | Status: DC | PRN

## 2014-07-28 MED ORDER — VITAMIN E 180 MG (400 UNIT) PO CAPS
400.0000 [IU] | ORAL_CAPSULE | Freq: Every day | ORAL | Status: DC
  Administered 2014-07-29 – 2014-07-30 (×2): 400 [IU] via ORAL
  Filled 2014-07-28 (×2): qty 1

## 2014-07-28 MED ORDER — CEFAZOLIN SODIUM-DEXTROSE 2-3 GM-% IV SOLR
INTRAVENOUS | Status: AC
  Filled 2014-07-28: qty 50

## 2014-07-28 MED ORDER — ADULT MULTIVITAMIN W/MINERALS CH
1.0000 | ORAL_TABLET | Freq: Every day | ORAL | Status: DC
  Administered 2014-07-28 – 2014-07-30 (×3): 1 via ORAL
  Filled 2014-07-28 (×4): qty 1

## 2014-07-28 SURGICAL SUPPLY — 48 items
BIT DRILL 4.3MMS DISTAL GRDTED (BIT) ×1 IMPLANT
BNDG COHESIVE 4X5 TAN STRL (GAUZE/BANDAGES/DRESSINGS) ×3 IMPLANT
CANISTER SUCTION 2500CC (MISCELLANEOUS) ×3 IMPLANT
CORTICAL BONE SCR 5.0MM X 46MM (Screw) ×3 IMPLANT
COVER MAYO STAND STRL (DRAPES) ×3 IMPLANT
COVER PERINEAL POST (MISCELLANEOUS) ×3 IMPLANT
COVER SURGICAL LIGHT HANDLE (MISCELLANEOUS) ×3 IMPLANT
DRAPE C-ARM 42X72 X-RAY (DRAPES) ×3 IMPLANT
DRAPE STERI IOBAN 125X83 (DRAPES) ×3 IMPLANT
DRILL 4.3MMS DISTAL GRADUATED (BIT) ×3
DRSG MEPILEX BORDER 4X4 (GAUZE/BANDAGES/DRESSINGS) ×9 IMPLANT
DRSG PAD ABDOMINAL 8X10 ST (GAUZE/BANDAGES/DRESSINGS) IMPLANT
DURAPREP 26ML APPLICATOR (WOUND CARE) ×3 IMPLANT
ELECT REM PT RETURN 9FT ADLT (ELECTROSURGICAL) ×3
ELECTRODE REM PT RTRN 9FT ADLT (ELECTROSURGICAL) ×1 IMPLANT
EVACUATOR 1/8 PVC DRAIN (DRAIN) IMPLANT
GAUZE SPONGE 4X4 12PLY STRL (GAUZE/BANDAGES/DRESSINGS) IMPLANT
GAUZE XEROFORM 1X8 LF (GAUZE/BANDAGES/DRESSINGS) IMPLANT
GLOVE BIOGEL PI IND STRL 6.5 (GLOVE) ×1 IMPLANT
GLOVE BIOGEL PI IND STRL 8 (GLOVE) ×1 IMPLANT
GLOVE BIOGEL PI INDICATOR 6.5 (GLOVE) ×2
GLOVE BIOGEL PI INDICATOR 8 (GLOVE) ×2
GLOVE ORTHO TXT STRL SZ7.5 (GLOVE) ×3 IMPLANT
GLOVE SURG SS PI 6.5 STRL IVOR (GLOVE) ×3 IMPLANT
GOWN STRL REUS W/ TWL LRG LVL3 (GOWN DISPOSABLE) ×1 IMPLANT
GOWN STRL REUS W/TWL LRG LVL3 (GOWN DISPOSABLE) ×5 IMPLANT
GUIDEWIRE BALL NOSE 100CM (WIRE) ×3 IMPLANT
HIP FRA NAIL LAG SCREW 10.5X90 (Orthopedic Implant) ×3 IMPLANT
HIP FRAC NAIL LEFT 11X360MM (Orthopedic Implant) ×3 IMPLANT
KIT BASIN OR (CUSTOM PROCEDURE TRAY) ×3 IMPLANT
KIT ROOM TURNOVER OR (KITS) ×3 IMPLANT
LINER BOOT UNIVERSAL DISP (MISCELLANEOUS) ×3 IMPLANT
MANIFOLD NEPTUNE II (INSTRUMENTS) ×3 IMPLANT
NAIL HIP FRAC LEFT 11X360MM (Orthopedic Implant) ×1 IMPLANT
NS IRRIG 1000ML POUR BTL (IV SOLUTION) IMPLANT
PACK GENERAL/GYN (CUSTOM PROCEDURE TRAY) ×3 IMPLANT
PAD ARMBOARD 7.5X6 YLW CONV (MISCELLANEOUS) ×6 IMPLANT
PIN GUIDE 3.2 903003004 (MISCELLANEOUS) ×6 IMPLANT
SCREW BONE CORTICAL 5.0X40 (Screw) ×3 IMPLANT
SCREW CORTICL BON 5.0MM X 46MM (Screw) ×1 IMPLANT
SCREW LAG HIP FRA NAIL 10.5X90 (Orthopedic Implant) ×1 IMPLANT
SCREWDRIVER HEX TIP 3.5MM (MISCELLANEOUS) ×3 IMPLANT
STAPLER VISISTAT 35W (STAPLE) ×3 IMPLANT
SUT VIC AB 0 CT1 27 (SUTURE) ×2
SUT VIC AB 0 CT1 27XBRD ANBCTR (SUTURE) ×1 IMPLANT
SUT VIC AB 2-0 CT1 27 (SUTURE) ×2
SUT VIC AB 2-0 CT1 TAPERPNT 27 (SUTURE) ×1 IMPLANT
WATER STERILE IRR 1000ML POUR (IV SOLUTION) ×3 IMPLANT

## 2014-07-28 NOTE — Consult Note (Signed)
Reason for Consult:   Fall in garage with left IT hip Fx Referring Physician: Wynelle Cleveland MD  Michelle Gaines is an 81 y.o. female.  HPI: 81 yo female known to me from last year [facility] with fall in her garage and left IT closed hip fracture.   Past Medical History  Diagnosis Date  . Hypertension   . Coronary artery disease   . PVD (peripheral vascular disease)   . Peripheral arterial disease   . Dysrhythmia     ATRIAL FLUTTER  . GERD (gastroesophageal reflux disease)   . Cancer     OVARIAN    1967  . Arthritis   . Atrial flutter     Past Surgical History  Procedure Laterality Date  . Bpg    . Pr vein bypass graft,aorto-fem-pop      Right   . Gliayte catherter insertion  06/27/10  . Carotid stent  11/30/09    LAD S/P Right CA DES  . Abdominal hysterectomy    . Atrial flutter ablation N/A 05/22/2013    Procedure: ATRIAL FLUTTER ABLATION;  Surgeon: Evans Lance, MD;  Location: [facility] CATH LAB;  Service: Cardiovascular;  Laterality: N/A;    Family History  Problem Relation Age of Onset  . COPD Father   . Hypertension Father     Social History:  reports that she has never smoked. She has never used smokeless tobacco. She reports that she does not drink alcohol or use illicit drugs.  Allergies:  Allergies  Allergen Reactions  . Fexofenadine Shortness Of Breath  . Cephalosporins Rash  . Amlodipine   . Anaprox [Naproxen Sodium]   . Benzonatate   . Cephalexin   . Clindamycin/Lincomycin   . Clorazepate   . Codeine   . Diltiazem     Dizziness   . Doxycycline   . Hydralazine   . Labetalol   . Lisinopril   . Phenylephrine   . Phenytoin   . Zolpidem   . Penicillins Nausea And Vomiting, Swelling and Rash    Medications: I have reviewed the patient's current medications.  Results for orders placed or performed during the hospital encounter of 07/27/14 (from the past 48 hour(s))  CBC WITH DIFFERENTIAL     Status: Abnormal   Collection Time: 07/28/14 12:12 AM  Result Value  Ref Range   WBC 9.3 4.0 - 10.5 K/uL   RBC 3.82 (L) 3.87 - 5.11 MIL/uL   Hemoglobin 12.2 12.0 - 15.0 g/dL   HCT 37.1 36.0 - 46.0 %   MCV 97.1 78.0 - 100.0 fL   MCH 31.9 26.0 - 34.0 pg   MCHC 32.9 30.0 - 36.0 g/dL   RDW 13.5 11.5 - 15.5 %   Platelets 134 (L) 150 - 400 K/uL   Neutrophils Relative % 85 (H) 43 - 77 %   Neutro Abs 7.9 (H) 1.7 - 7.7 K/uL   Lymphocytes Relative 9 (L) 12 - 46 %   Lymphs Abs 0.8 0.7 - 4.0 K/uL   Monocytes Relative 5 3 - 12 %   Monocytes Absolute 0.4 0.1 - 1.0 K/uL   Eosinophils Relative 1 0 - 5 %   Eosinophils Absolute 0.1 0.0 - 0.7 K/uL   Basophils Relative 0 0 - 1 %   Basophils Absolute 0.0 0.0 - 0.1 K/uL  Protime-INR     Status: None   Collection Time: 07/28/14 12:12 AM  Result Value Ref Range   Prothrombin Time 14.4 11.6 - 15.2 seconds   INR 1.10  0.00 - 1.49  Type and screen     Status: None   Collection Time: 07/28/14 12:12 AM  Result Value Ref Range   ABO/RH(D) O POS    Antibody Screen NEG    Sample Expiration 07/31/2014   Comprehensive metabolic panel     Status: Abnormal   Collection Time: 07/28/14 12:12 AM  Result Value Ref Range   Sodium 138 135 - 145 mmol/L   Potassium 3.0 (L) 3.5 - 5.1 mmol/L   Chloride 103 96 - 112 mmol/L   CO2 27 19 - 32 mmol/L   Glucose, Bld 157 (H) 70 - 99 mg/dL   BUN 22 6 - 23 mg/dL   Creatinine, Ser 0.89 0.50 - 1.10 mg/dL   Calcium 9.2 8.4 - 10.5 mg/dL   Total Protein 6.7 6.0 - 8.3 g/dL   Albumin 4.1 3.5 - 5.2 g/dL   AST 32 0 - 37 U/L   ALT 24 0 - 35 U/L   Alkaline Phosphatase 61 39 - 117 U/L   Total Bilirubin 0.9 0.3 - 1.2 mg/dL   GFR calc non Af Amer 58 (L) >90 mL/min   GFR calc Af Amer 68 (L) >90 mL/min    Comment: (NOTE) The eGFR has been calculated using the CKD EPI equation. This calculation has not been validated in all clinical situations. eGFR's persistently <90 mL/min signify possible Chronic Kidney Disease.    Anion gap 8 5 - 15  Urinalysis, Routine w reflex microscopic     Status: Abnormal    Collection Time: 07/28/14 12:47 AM  Result Value Ref Range   Color, Urine YELLOW YELLOW   APPearance CLOUDY (A) CLEAR   Specific Gravity, Urine 1.010 1.005 - 1.030   pH 8.0 5.0 - 8.0   Glucose, UA NEGATIVE NEGATIVE mg/dL   Hgb urine dipstick NEGATIVE NEGATIVE   Bilirubin Urine NEGATIVE NEGATIVE   Ketones, ur NEGATIVE NEGATIVE mg/dL   Protein, ur NEGATIVE NEGATIVE mg/dL   Urobilinogen, UA 0.2 0.0 - 1.0 mg/dL   Nitrite NEGATIVE NEGATIVE   Leukocytes, UA NEGATIVE NEGATIVE    Comment: MICROSCOPIC NOT DONE ON URINES WITH NEGATIVE PROTEIN, BLOOD, LEUKOCYTES, NITRITE, OR GLUCOSE <1000 mg/dL.  Magnesium     Status: None   Collection Time: 07/28/14  2:18 AM  Result Value Ref Range   Magnesium 2.1 1.5 - 2.5 mg/dL  Comprehensive metabolic panel     Status: Abnormal   Collection Time: 07/28/14  2:43 AM  Result Value Ref Range   Sodium 140 135 - 145 mmol/L   Potassium 3.2 (L) 3.5 - 5.1 mmol/L   Chloride 103 96 - 112 mmol/L   CO2 29 19 - 32 mmol/L   Glucose, Bld 157 (H) 70 - 99 mg/dL   BUN 21 6 - 23 mg/dL   Creatinine, Ser 0.92 0.50 - 1.10 mg/dL   Calcium 8.9 8.4 - 10.5 mg/dL   Total Protein 6.3 6.0 - 8.3 g/dL   Albumin 3.8 3.5 - 5.2 g/dL   AST 28 0 - 37 U/L   ALT 22 0 - 35 U/L   Alkaline Phosphatase 58 39 - 117 U/L   Total Bilirubin 1.0 0.3 - 1.2 mg/dL   GFR calc non Af Amer 56 (L) >90 mL/min   GFR calc Af Amer 65 (L) >90 mL/min    Comment: (NOTE) The eGFR has been calculated using the CKD EPI equation. This calculation has not been validated in all clinical situations. eGFR's persistently <90 mL/min signify possible Chronic Kidney Disease.  Anion gap 8 5 - 15  CBC WITH DIFFERENTIAL     Status: Abnormal   Collection Time: 07/28/14  2:43 AM  Result Value Ref Range   WBC 8.9 4.0 - 10.5 K/uL   RBC 3.53 (L) 3.87 - 5.11 MIL/uL   Hemoglobin 11.2 (L) 12.0 - 15.0 g/dL   HCT 33.5 (L) 36.0 - 46.0 %   MCV 94.9 78.0 - 100.0 fL   MCH 31.7 26.0 - 34.0 pg   MCHC 33.4 30.0 - 36.0 g/dL    RDW 13.5 11.5 - 15.5 %   Platelets 132 (L) 150 - 400 K/uL   Neutrophils Relative % 86 (H) 43 - 77 %   Neutro Abs 7.6 1.7 - 7.7 K/uL   Lymphocytes Relative 10 (L) 12 - 46 %   Lymphs Abs 0.9 0.7 - 4.0 K/uL   Monocytes Relative 4 3 - 12 %   Monocytes Absolute 0.4 0.1 - 1.0 K/uL   Eosinophils Relative 0 0 - 5 %   Eosinophils Absolute 0.0 0.0 - 0.7 K/uL   Basophils Relative 0 0 - 1 %   Basophils Absolute 0.0 0.0 - 0.1 K/uL    Dg Chest 1 View  07/28/2014   CLINICAL DATA:  Status post fall; concern for chest injury. Initial encounter.  EXAM: CHEST - 1 VIEW  COMPARISON:  Chest radiograph performed 05/20/2013  FINDINGS: The lungs are well-aerated and clear. There is no evidence of focal opacification, pleural effusion or pneumothorax.  The cardiomediastinal silhouette is mildly enlarged. No acute osseous abnormalities are seen.  IMPRESSION: No acute cardiopulmonary process seen; mild cardiomegaly noted. No displaced rib fractures identified.   Electronically Signed   By: Garald Balding M.D.   On: 07/28/2014 00:20   Dg Knee Complete 4 Views Left  07/27/2014   CLINICAL DATA:  Left knee pain after fall.  EXAM: LEFT KNEE - COMPLETE 4+ VIEW  COMPARISON:  None.  FINDINGS: No acute fracture or dislocation. There is osteoarthritis in the lateral tibial femoral compartment with possible remote prior injury to the lateral tibial plateau. The bones are under mineralized. There is no joint effusion or localizing soft tissue abnormality. Vascular calcifications are seen.  IMPRESSION: No fracture or dislocation of the left knee.   Electronically Signed   By: Jeb Levering M.D.   On: 07/27/2014 23:47   Dg Hip Unilat With Pelvis 2-3 Views Left  07/27/2014   CLINICAL DATA:  Status post fall in garage, with acute onset of left hip pain. Initial encounter.  EXAM: LEFT HIP (WITH PELVIS) 2-3 VIEWS  COMPARISON:  None.  FINDINGS: There is a mildly comminuted left femoral intertrochanteric fracture. Mild lateral displacement  and rotation of the distal femur is noted. The left femoral head remains seated at the acetabulum. The right hip joint is unremarkable in appearance.  Degenerative change is noted at the lower lumbar spine and at the sacroiliac joints. The visualized bowel gas pattern is grossly unremarkable, with a stool ball noted at the rectum. Scattered vascular calcifications are seen.  IMPRESSION: 1. Mildly comminuted left femoral intertrochanteric fracture, with mild lateral displacement and rotation of the distal femur. 2. Scattered vascular calcifications seen.   Electronically Signed   By: Garald Balding M.D.   On: 07/27/2014 23:50    Review of Systems  Constitutional: Negative for fever and chills.  Eyes: Negative for blurred vision.  Respiratory: Negative for cough and sputum production.   Cardiovascular: Positive for palpitations. Negative for chest pain.  Positive for A fib  Gastrointestinal: Negative for heartburn.  Musculoskeletal: Positive for falls.  Neurological: Negative for dizziness and headaches.  Psychiatric/Behavioral: The patient does not have insomnia.    Blood pressure 156/75, pulse 62, temperature 97.6 F (36.4 C), temperature source Oral, resp. rate 16, height _0  (1.626 m), weight 38.102 kg (84 lb), SpO2 100 %. Physical Exam  Constitutional: She is oriented to person, place, and time. She appears well-developed and well-nourished.  HENT:  Head: Normocephalic.  Eyes: Pupils are equal, round, and reactive to light.  Neck: Normal range of motion.  Cardiovascular: Normal rate and regular rhythm.   No murmur heard. Respiratory: Effort normal.  GI: Soft.  Musculoskeletal:  Pulses 2 plus , sciatic intact , left LE short and ER. Pain with ROM  Neurological: She is oriented to person, place, and time.  Skin: Skin is warm and dry.  Psychiatric: She has a normal mood and affect. Her behavior is normal. Judgment and thought content normal.    Assessment/Plan: Left IT hip  Fx, plan troch long nail fixation of IT hip Fx. Discussed with pt she agrees to proceed.  Josey Forcier C 07/28/2014, 12:56 PM

## 2014-07-28 NOTE — Progress Notes (Signed)
Reported to Veneta Penton RN as primary caregiver.

## 2014-07-28 NOTE — Progress Notes (Signed)
Pt given 5mg  IV hydralazine.  Will continue to monitor BP and reassess continuously.  Pt still very sleepy and appears to be comfortable.  Will notify MD for changes.

## 2014-07-28 NOTE — Anesthesia Preprocedure Evaluation (Addendum)
Anesthesia Evaluation  Patient identified by MRN, date of birth, ID band Patient awake    Reviewed: Allergy & Precautions, NPO status , Patient's Chart, lab work & pertinent test results, reviewed documented beta blocker date and time   Airway Mallampati: II  TM Distance: >3 FB Neck ROM: Limited    Dental  (+) Teeth Intact, Dental Advisory Given   Pulmonary          Cardiovascular hypertension, Pt. on medications and Pt. on home beta blockers + CAD + dysrhythmias Atrial Fibrillation     Neuro/Psych    GI/Hepatic   Endo/Other    Renal/GU      Musculoskeletal   Abdominal   Peds  Hematology   Anesthesia Other Findings   Reproductive/Obstetrics                           Anesthesia Physical Anesthesia Plan  ASA: III  Anesthesia Plan: General   Post-op Pain Management:    Induction: Intravenous  Airway Management Planned: Oral ETT  Additional Equipment: None  Intra-op Plan:   Post-operative Plan: Extubation in OR  Informed Consent: I have reviewed the patients History and Physical, chart, labs and discussed the procedure including the risks, benefits and alternatives for the proposed anesthesia with the patient or authorized representative who has indicated his/her understanding and acceptance.   Dental advisory given  Plan Discussed with: CRNA and Surgeon  Anesthesia Plan Comments:        Anesthesia Quick Evaluation

## 2014-07-28 NOTE — Anesthesia Postprocedure Evaluation (Signed)
  Anesthesia Post-op Note  Patient: Michelle Gaines  Procedure(s) Performed: Procedure(s): Left Affixus Nail (Left)  Patient Location: PACU  Anesthesia Type:General  Level of Consciousness: awake and alert   Airway and Oxygen Therapy: Patient Spontanous Breathing  Post-op Pain: mild  Post-op Assessment: Post-op Vital signs reviewed, Patient's Cardiovascular Status Stable and Respiratory Function Stable  Post-op Vital Signs: Reviewed  Filed Vitals:   07/28/14 1945  BP: 174/76  Pulse: 61  Temp:   Resp: 13    Complications: No apparent anesthesia complications

## 2014-07-28 NOTE — H&P (View-Only) (Signed)
Reason for Consult:   Fall in garage with left IT hip Fx Referring Physician: Rizwan MD  Michelle Gaines is an 81 y.o. female.  HPI: 81 yo female known to me from last year [facility] with fall in her garage and left IT closed hip fracture.   Past Medical History  Diagnosis Date  . Hypertension   . Coronary artery disease   . PVD (peripheral vascular disease)   . Peripheral arterial disease   . Dysrhythmia     ATRIAL FLUTTER  . GERD (gastroesophageal reflux disease)   . Cancer     OVARIAN    1967  . Arthritis   . Atrial flutter     Past Surgical History  Procedure Laterality Date  . Bpg    . Pr vein bypass graft,aorto-fem-pop      Right   . Gliayte catherter insertion  06/27/10  . Carotid stent  11/30/09    LAD S/P Right CA DES  . Abdominal hysterectomy    . Atrial flutter ablation N/A 05/22/2013    Procedure: ATRIAL FLUTTER ABLATION;  Surgeon: Gregg W Taylor, MD;  Location: [facility] CATH LAB;  Service: Cardiovascular;  Laterality: N/A;    Family History  Problem Relation Age of Onset  . COPD Father   . Hypertension Father     Social History:  reports that she has never smoked. She has never used smokeless tobacco. She reports that she does not drink alcohol or use illicit drugs.  Allergies:  Allergies  Allergen Reactions  . Fexofenadine Shortness Of Breath  . Cephalosporins Rash  . Amlodipine   . Anaprox [Naproxen Sodium]   . Benzonatate   . Cephalexin   . Clindamycin/Lincomycin   . Clorazepate   . Codeine   . Diltiazem     Dizziness   . Doxycycline   . Hydralazine   . Labetalol   . Lisinopril   . Phenylephrine   . Phenytoin   . Zolpidem   . Penicillins Nausea And Vomiting, Swelling and Rash    Medications: I have reviewed the patient's current medications.  Results for orders placed or performed during the hospital encounter of 07/27/14 (from the past 48 hour(s))  CBC WITH DIFFERENTIAL     Status: Abnormal   Collection Time: 07/28/14 12:12 AM  Result Value  Ref Range   WBC 9.3 4.0 - 10.5 K/uL   RBC 3.82 (L) 3.87 - 5.11 MIL/uL   Hemoglobin 12.2 12.0 - 15.0 g/dL   HCT 37.1 36.0 - 46.0 %   MCV 97.1 78.0 - 100.0 fL   MCH 31.9 26.0 - 34.0 pg   MCHC 32.9 30.0 - 36.0 g/dL   RDW 13.5 11.5 - 15.5 %   Platelets 134 (L) 150 - 400 K/uL   Neutrophils Relative % 85 (H) 43 - 77 %   Neutro Abs 7.9 (H) 1.7 - 7.7 K/uL   Lymphocytes Relative 9 (L) 12 - 46 %   Lymphs Abs 0.8 0.7 - 4.0 K/uL   Monocytes Relative 5 3 - 12 %   Monocytes Absolute 0.4 0.1 - 1.0 K/uL   Eosinophils Relative 1 0 - 5 %   Eosinophils Absolute 0.1 0.0 - 0.7 K/uL   Basophils Relative 0 0 - 1 %   Basophils Absolute 0.0 0.0 - 0.1 K/uL  Protime-INR     Status: None   Collection Time: 07/28/14 12:12 AM  Result Value Ref Range   Prothrombin Time 14.4 11.6 - 15.2 seconds   INR 1.10   0.00 - 1.49  Type and screen     Status: None   Collection Time: 07/28/14 12:12 AM  Result Value Ref Range   ABO/RH(D) O POS    Antibody Screen NEG    Sample Expiration 07/31/2014   Comprehensive metabolic panel     Status: Abnormal   Collection Time: 07/28/14 12:12 AM  Result Value Ref Range   Sodium 138 135 - 145 mmol/L   Potassium 3.0 (L) 3.5 - 5.1 mmol/L   Chloride 103 96 - 112 mmol/L   CO2 27 19 - 32 mmol/L   Glucose, Bld 157 (H) 70 - 99 mg/dL   BUN 22 6 - 23 mg/dL   Creatinine, Ser 0.89 0.50 - 1.10 mg/dL   Calcium 9.2 8.4 - 10.5 mg/dL   Total Protein 6.7 6.0 - 8.3 g/dL   Albumin 4.1 3.5 - 5.2 g/dL   AST 32 0 - 37 U/L   ALT 24 0 - 35 U/L   Alkaline Phosphatase 61 39 - 117 U/L   Total Bilirubin 0.9 0.3 - 1.2 mg/dL   GFR calc non Af Amer 58 (L) >90 mL/min   GFR calc Af Amer 68 (L) >90 mL/min    Comment: (NOTE) The eGFR has been calculated using the CKD EPI equation. This calculation has not been validated in all clinical situations. eGFR's persistently <90 mL/min signify possible Chronic Kidney Disease.    Anion gap 8 5 - 15  Urinalysis, Routine w reflex microscopic     Status: Abnormal    Collection Time: 07/28/14 12:47 AM  Result Value Ref Range   Color, Urine YELLOW YELLOW   APPearance CLOUDY (A) CLEAR   Specific Gravity, Urine 1.010 1.005 - 1.030   pH 8.0 5.0 - 8.0   Glucose, UA NEGATIVE NEGATIVE mg/dL   Hgb urine dipstick NEGATIVE NEGATIVE   Bilirubin Urine NEGATIVE NEGATIVE   Ketones, ur NEGATIVE NEGATIVE mg/dL   Protein, ur NEGATIVE NEGATIVE mg/dL   Urobilinogen, UA 0.2 0.0 - 1.0 mg/dL   Nitrite NEGATIVE NEGATIVE   Leukocytes, UA NEGATIVE NEGATIVE    Comment: MICROSCOPIC NOT DONE ON URINES WITH NEGATIVE PROTEIN, BLOOD, LEUKOCYTES, NITRITE, OR GLUCOSE <1000 mg/dL.  Magnesium     Status: None   Collection Time: 07/28/14  2:18 AM  Result Value Ref Range   Magnesium 2.1 1.5 - 2.5 mg/dL  Comprehensive metabolic panel     Status: Abnormal   Collection Time: 07/28/14  2:43 AM  Result Value Ref Range   Sodium 140 135 - 145 mmol/L   Potassium 3.2 (L) 3.5 - 5.1 mmol/L   Chloride 103 96 - 112 mmol/L   CO2 29 19 - 32 mmol/L   Glucose, Bld 157 (H) 70 - 99 mg/dL   BUN 21 6 - 23 mg/dL   Creatinine, Ser 0.92 0.50 - 1.10 mg/dL   Calcium 8.9 8.4 - 10.5 mg/dL   Total Protein 6.3 6.0 - 8.3 g/dL   Albumin 3.8 3.5 - 5.2 g/dL   AST 28 0 - 37 U/L   ALT 22 0 - 35 U/L   Alkaline Phosphatase 58 39 - 117 U/L   Total Bilirubin 1.0 0.3 - 1.2 mg/dL   GFR calc non Af Amer 56 (L) >90 mL/min   GFR calc Af Amer 65 (L) >90 mL/min    Comment: (NOTE) The eGFR has been calculated using the CKD EPI equation. This calculation has not been validated in all clinical situations. eGFR's persistently <90 mL/min signify possible Chronic Kidney Disease.  Anion gap 8 5 - 15  CBC WITH DIFFERENTIAL     Status: Abnormal   Collection Time: 07/28/14  2:43 AM  Result Value Ref Range   WBC 8.9 4.0 - 10.5 K/uL   RBC 3.53 (L) 3.87 - 5.11 MIL/uL   Hemoglobin 11.2 (L) 12.0 - 15.0 g/dL   HCT 33.5 (L) 36.0 - 46.0 %   MCV 94.9 78.0 - 100.0 fL   MCH 31.7 26.0 - 34.0 pg   MCHC 33.4 30.0 - 36.0 g/dL    RDW 13.5 11.5 - 15.5 %   Platelets 132 (L) 150 - 400 K/uL   Neutrophils Relative % 86 (H) 43 - 77 %   Neutro Abs 7.6 1.7 - 7.7 K/uL   Lymphocytes Relative 10 (L) 12 - 46 %   Lymphs Abs 0.9 0.7 - 4.0 K/uL   Monocytes Relative 4 3 - 12 %   Monocytes Absolute 0.4 0.1 - 1.0 K/uL   Eosinophils Relative 0 0 - 5 %   Eosinophils Absolute 0.0 0.0 - 0.7 K/uL   Basophils Relative 0 0 - 1 %   Basophils Absolute 0.0 0.0 - 0.1 K/uL    Dg Chest 1 View  07/28/2014   CLINICAL DATA:  Status post fall; concern for chest injury. Initial encounter.  EXAM: CHEST - 1 VIEW  COMPARISON:  Chest radiograph performed 05/20/2013  FINDINGS: The lungs are well-aerated and clear. There is no evidence of focal opacification, pleural effusion or pneumothorax.  The cardiomediastinal silhouette is mildly enlarged. No acute osseous abnormalities are seen.  IMPRESSION: No acute cardiopulmonary process seen; mild cardiomegaly noted. No displaced rib fractures identified.   Electronically Signed   By: Garald Balding M.D.   On: 07/28/2014 00:20   Dg Knee Complete 4 Views Left  07/27/2014   CLINICAL DATA:  Left knee pain after fall.  EXAM: LEFT KNEE - COMPLETE 4+ VIEW  COMPARISON:  None.  FINDINGS: No acute fracture or dislocation. There is osteoarthritis in the lateral tibial femoral compartment with possible remote prior injury to the lateral tibial plateau. The bones are under mineralized. There is no joint effusion or localizing soft tissue abnormality. Vascular calcifications are seen.  IMPRESSION: No fracture or dislocation of the left knee.   Electronically Signed   By: Jeb Levering M.D.   On: 07/27/2014 23:47   Dg Hip Unilat With Pelvis 2-3 Views Left  07/27/2014   CLINICAL DATA:  Status post fall in garage, with acute onset of left hip pain. Initial encounter.  EXAM: LEFT HIP (WITH PELVIS) 2-3 VIEWS  COMPARISON:  None.  FINDINGS: There is a mildly comminuted left femoral intertrochanteric fracture. Mild lateral displacement  and rotation of the distal femur is noted. The left femoral head remains seated at the acetabulum. The right hip joint is unremarkable in appearance.  Degenerative change is noted at the lower lumbar spine and at the sacroiliac joints. The visualized bowel gas pattern is grossly unremarkable, with a stool ball noted at the rectum. Scattered vascular calcifications are seen.  IMPRESSION: 1. Mildly comminuted left femoral intertrochanteric fracture, with mild lateral displacement and rotation of the distal femur. 2. Scattered vascular calcifications seen.   Electronically Signed   By: Garald Balding M.D.   On: 07/27/2014 23:50    Review of Systems  Constitutional: Negative for fever and chills.  Eyes: Negative for blurred vision.  Respiratory: Negative for cough and sputum production.   Cardiovascular: Positive for palpitations. Negative for chest pain.  Positive for A fib  Gastrointestinal: Negative for heartburn.  Musculoskeletal: Positive for falls.  Neurological: Negative for dizziness and headaches.  Psychiatric/Behavioral: The patient does not have insomnia.    Blood pressure 156/75, pulse 62, temperature 97.6 F (36.4 C), temperature source Oral, resp. rate 16, height _0  (1.626 m), weight 38.102 kg (84 lb), SpO2 100 %. Physical Exam  Constitutional: She is oriented to person, place, and time. She appears well-developed and well-nourished.  HENT:  Head: Normocephalic.  Eyes: Pupils are equal, round, and reactive to light.  Neck: Normal range of motion.  Cardiovascular: Normal rate and regular rhythm.   No murmur heard. Respiratory: Effort normal.  GI: Soft.  Musculoskeletal:  Pulses 2 plus , sciatic intact , left LE short and ER. Pain with ROM  Neurological: She is oriented to person, place, and time.  Skin: Skin is warm and dry.  Psychiatric: She has a normal mood and affect. Her behavior is normal. Judgment and thought content normal.    Assessment/Plan: Left IT hip  Fx, plan troch long nail fixation of IT hip Fx. Discussed with pt she agrees to proceed.  YATES,MARK C 07/28/2014, 12:56 PM

## 2014-07-28 NOTE — Brief Op Note (Signed)
07/27/2014 - 07/28/2014  6:39 PM  PATIENT:  [name]  81 y.o. female  PRE-OPERATIVE DIAGNOSIS:  Left Intertroch Nail  POST-OPERATIVE DIAGNOSIS:  Left Intertroch Nail  PROCEDURE:  Procedure(s): Left Affixus Nail (Left)  SURGEON:  Surgeon(s) and Role:    * Marybelle Killings, MD - Primary  PHYSICIAN ASSISTANT:   ASSISTANTS: none   ANESTHESIA:   local and general  EBL:  Total I/O In: 1403.8 [I.V.:1403.8] Out: 1000 [Urine:1000]  BLOOD ADMINISTERED:none  DRAINS: none   LOCAL MEDICATIONS USED:  MARCAINE     SPECIMEN:  No Specimen  DISPOSITION OF SPECIMEN:  N/A  COUNTS:  YES  TOURNIQUET:  * No tourniquets in log *  DICTATION: .Other Dictation: Dictation Number [id number]  PLAN OF CARE: already inpt  PATIENT DISPOSITION:  PACU - hemodynamically stable.   Delay start of Pharmacological VTE agent (>24hrs) due to surgical blood loss or risk of bleeding: yes

## 2014-07-28 NOTE — Transfer of Care (Signed)
Immediate Anesthesia Transfer of Care Note  Patient: Michelle Gaines  Procedure(s) Performed: Procedure(s): Left Affixus Nail (Left)  Patient Location: PACU  Anesthesia Type:General  Level of Consciousness: awake  Airway & Oxygen Therapy: Patient Spontanous Breathing and Patient connected to nasal cannula oxygen  Post-op Assessment: Report given to RN, Post -op Vital signs reviewed and stable and Patient moving all extremities  Post vital signs: Reviewed and stable  Last Vitals:  Filed Vitals:   07/28/14 1607  BP: 146/75  Pulse: 79  Temp: 36.4 C  Resp: 18    Complications: No apparent anesthesia complications

## 2014-07-28 NOTE — Progress Notes (Signed)
UR completed 

## 2014-07-28 NOTE — Anesthesia Procedure Notes (Signed)
Procedure Name: Intubation Date/Time: 07/28/2014 5:22 PM Performed by: Melina Copa, Deryn Massengale R Pre-anesthesia Checklist: Patient identified, Emergency Drugs available, Suction available, Patient being monitored and Timeout performed Patient Re-evaluated:Patient Re-evaluated prior to inductionOxygen Delivery Method: Circle system utilized Preoxygenation: Pre-oxygenation with 100% oxygen Intubation Type: IV induction Ventilation: Mask ventilation without difficulty Laryngoscope Size: Mac and 3 Grade View: Grade II Tube type: Oral Tube size: 7.5 mm Number of attempts: 1 Airway Equipment and Method: Stylet Placement Confirmation: ETT inserted through vocal cords under direct vision,  positive ETCO2 and breath sounds checked- equal and bilateral Secured at: 19 cm Tube secured with: Tape Dental Injury: Teeth and Oropharynx as per pre-operative assessment

## 2014-07-28 NOTE — Progress Notes (Signed)
PHARMACIST - PHYSICIAN ORDER COMMUNICATION  CONCERNING: P&T Medication Policy on Herbal Medications  DESCRIPTION:  This patient's order for:  Lutein and potassium 99 mg  has been noted.  This product(s) is classified as an "herbal" or natural product. Due to a lack of definitive safety studies or FDA approval, nonstandard manufacturing practices, plus the potential risk of unknown drug-drug interactions while on inpatient medications, the Pharmacy and Therapeutics Committee does not permit the use of "herbal" or natural products of this type within [facility].   ACTION TAKEN: The pharmacy department is unable to verify this order at this time and your patient has been informed of this safety policy. Please reevaluate patient's clinical condition at discharge and address if the herbal or natural product(s) should be resumed at that time.  [place], Pharm.D., BCPS Clinical Pharmacist Pager: [phone] 07/28/2014 9:30 PM

## 2014-07-28 NOTE — ED Notes (Signed)
Pt states she does not wish to have any further pain medication at this time, pt encouraged to let RN know if she needs something, resting without distress.

## 2014-07-28 NOTE — Progress Notes (Signed)
Patient ID: Michelle Gaines, female   DOB: 06-May-1929, 81 y.o.   MRN: [id number] Patient known to me  From previous carpal tunnel surgery  With fall and left IT hip fx. Full consult to follow . Have posted her for surgery late afternoon. She is in agreement.

## 2014-07-28 NOTE — Progress Notes (Signed)
INITIAL NUTRITION ASSESSMENT  Pt meets criteria for SEVERE MALNUTRITION in the context of chronic illness as evidenced by severe fat and muscle mass loss.  DOCUMENTATION CODES Per approved criteria  -Severe malnutrition in the context of chronic illness -Underweight   INTERVENTION: Once diet advances, provide Ensure Complete po TID, each supplement provides 350 kcal and 13 grams of protein.  NUTRITION DIAGNOSIS: Increased nutrient needs related to surgery as evidenced by estimated nutrition needs.   Goal: Pt to meet >/= 90% of their estimated nutrition needs   Monitor:  Diet advancement, weight trends, labs, I/O's  Reason for Assessment: Low BMI  81 y.o. female  Admitting Dx: L Hip fracture  ASSESSMENT: Pt with significant past medical history of atrial flutter, CAD, HTN presenting with mechanical fall, L hip fracture. Pt was at home when she accidentally fell, landing on her L hip. L hip xray Mildly comminuted left femoral intertrochanteric fracture, with mild lateral displacement and rotation of the distal femur.  Pt is currently NPO for anticipation of surgery today. Pt reports her appetite is not so great right now. She does report PTA having a great appetite with no difficulties. Weight has been stable. Pt reports wanting oral supplements once diet advances, as she regularly drinks them at home. RD to order/provide Ensure once diet advances.   Nutrition Focused Physical Exam:  Subcutaneous Fat:  Orbital Region: N/A Upper Arm Region: Severe depletion Thoracic and Lumbar Region: Severe depletion  Muscle:  Temple Region: N/A Clavicle Bone Region: Severe depletion Clavicle and Acromion Bone Region: Severe depletion Scapular Bone Region: N/A Dorsal Hand: Severe depletion Patellar Region: Severe depletion Anterior Thigh Region: Moderate to Severe depletion Posterior Calf Region: Moderate depletion  Edema: none  Labs: Low potassium and GFR.  Height: Ht Readings  from Last 1 Encounters:  07/27/14 5\' 4"  (1.626 m)    Weight: Wt Readings from Last 1 Encounters:  07/27/14 84 lb (38.102 kg)    Ideal Body Weight: 120 lbs  % Ideal Body Weight: 70%  Wt Readings from Last 10 Encounters:  07/27/14 84 lb (38.102 kg)  12/29/13 84 lb 6.4 oz (38.284 kg)  08/24/13 85 lb 12.8 oz (38.919 kg)  06/23/13 84 lb 12.8 oz (38.465 kg)  05/23/13 85 lb 14.4 oz (38.964 kg)  05/20/13 84 lb (38.102 kg)  02/17/13 86 lb 9.6 oz (39.282 kg)  12/30/12 84 lb 8 oz (38.329 kg)  08/20/12 90 lb 3.2 oz (40.914 kg)  04/24/12 90 lb (40.824 kg)    Usual Body Weight: 84 lbs  % Usual Body Weight: 100%  BMI:  Body mass index is 14.41 kg/(m^2). Underweight  Estimated Nutritional Needs: Kcal: 1450-1700 Protein: 60-70 grams Fluid: >/= 1.5 L/day  Skin: intact  Diet Order: Diet NPO time specified Except for: Sips with Meds  EDUCATION NEEDS: -Education not appropriate at this time   Intake/Output Summary (Last 24 hours) at 07/28/14 0923 Last data filed at 07/28/14 0413  Gross per 24 hour  Intake      0 ml  Output    800 ml  Net   -800 ml    Last BM: 2/2  Labs:   Recent Labs Lab 07/28/14 0012 07/28/14 0218 07/28/14 0243  NA 138  --  140  K 3.0*  --  3.2*  CL 103  --  103  CO2 27  --  29  BUN 22  --  21  CREATININE 0.89  --  0.92  CALCIUM 9.2  --  8.9  MG  --  2.1  --   GLUCOSE 157*  --  157*    CBG (last 3)  No results for input(s): GLUCAP in the last 72 hours.  Scheduled Meds: . carvedilol  3.125 mg Oral BID WC  . heparin  5,000 Units Subcutaneous 3 times per day  . multivitamin with minerals  1 tablet Oral Daily  . sodium chloride  3 mL Intravenous Q12H    Continuous Infusions: . 0.9 % NaCl with KCl 20 mEq / L 75 mL/hr at 07/28/14 0413    Past Medical History  Diagnosis Date  . Hypertension   . Coronary artery disease   . PVD (peripheral vascular disease)   . Peripheral arterial disease   . Dysrhythmia     ATRIAL FLUTTER  . GERD  (gastroesophageal reflux disease)   . Cancer     OVARIAN    1967  . Arthritis   . Atrial flutter     Past Surgical History  Procedure Laterality Date  . Bpg    . Pr vein bypass graft,aorto-fem-pop      Right   . Gliayte catherter insertion  06/27/10  . Carotid stent  11/30/09    LAD S/P Right CA DES  . Abdominal hysterectomy    . Atrial flutter ablation N/A 05/22/2013    Procedure: ATRIAL FLUTTER ABLATION;  Surgeon: Evans Lance, MD;  Location: [facility] CATH LAB;  Service: Cardiovascular;  Laterality: N/A;    Kallie Locks, MS, RD, LDN Pager # [phone] After hours/ weekend pager # [phone]

## 2014-07-28 NOTE — Progress Notes (Signed)
[facility] Progress Note   Michelle Gaines YYQ:[id number] DOB: [birth year] DOA: 07/27/2014 PCP: Criselda Peaches, MD  Brief narrative: Michelle Gaines is a 81 y.o. female with A-flutter, CAD, HTN who comes to the ER after a fall and is found to have a L hip fx.    Subjective: Feeling nauseated this morning- holding basin on chest- no vomiting. No other complaints.   Assessment/Plan: Principal Problem:   Hip fracture - awaiting repair- mod risk for surgery  Active Problems:   Essential hypertension - cont Carvedilol    Coronary atherosclerosis -with stenting - no c/o chest pain- noted that she is not on ASA at home    Atrial flutter - s/p catheter ablation on 11./14- not on anticoagulation  Code Status: Full code Family Communication:  Disposition Plan: to be determined- for surgery today DVT prophylaxis: per ortho  Consultants: ortho  Procedures: none  Antibiotics: Anti-infectives    None      Objective: Filed Weights   07/27/14 2252  Weight: 38.102 kg (84 lb)    Intake/Output Summary (Last 24 hours) at 07/28/14 1218 Last data filed at 07/28/14 0413  Gross per 24 hour  Intake      0 ml  Output    800 ml  Net   -800 ml     Vitals Filed Vitals:   07/28/14 0130 07/28/14 0200 07/28/14 0318 07/28/14 0546  BP: 174/68 153/68 198/98 156/75  Pulse: 53 49 56 62  Temp:   97.8 F (36.6 C) 97.6 F (36.4 C)  TempSrc:      Resp: 11 10 14 16   Height:      Weight:      SpO2: 97% 98% 100% 100%    Exam: General: AAO x3, No acute respiratory distress Lungs: Clear to auscultation bilaterally without wheezes or crackles Cardiovascular: Regular rate and rhythm without murmur gallop or rub normal S1 and S2 Abdomen: Nontender, nondistended, soft, bowel sounds positive, no rebound, no ascites, no appreciable mass Extremities: No significant cyanosis, clubbing, or edema bilateral lower extremities  Data Reviewed: Basic Metabolic Panel:  Recent  Labs Lab 07/28/14 0012 07/28/14 0218 07/28/14 0243  NA 138  --  140  K 3.0*  --  3.2*  CL 103  --  103  CO2 27  --  29  GLUCOSE 157*  --  157*  BUN 22  --  21  CREATININE 0.89  --  0.92  CALCIUM 9.2  --  8.9  MG  --  2.1  --    Liver Function Tests:  Recent Labs Lab 07/28/14 0012 07/28/14 0243  AST 32 28  ALT 24 22  ALKPHOS 61 58  BILITOT 0.9 1.0  PROT 6.7 6.3  ALBUMIN 4.1 3.8   No results for input(s): LIPASE, AMYLASE in the last 168 hours. No results for input(s): AMMONIA in the last 168 hours. CBC:  Recent Labs Lab 07/28/14 0012 07/28/14 0243  WBC 9.3 8.9  NEUTROABS 7.9* 7.6  HGB 12.2 11.2*  HCT 37.1 33.5*  MCV 97.1 94.9  PLT 134* 132*   Cardiac Enzymes: No results for input(s): CKTOTAL, CKMB, CKMBINDEX, TROPONINI in the last 168 hours. BNP (last 3 results) No results for input(s): BNP in the last 8760 hours.  ProBNP (last 3 results) No results for input(s): PROBNP in the last 8760 hours.  CBG: No results for input(s): GLUCAP in the last 168 hours.  No results found for this or any previous visit (from the past 240  hour(s)).   Studies:  Recent x-ray studies have been reviewed in detail by the Attending Physician  Scheduled Meds:  Scheduled Meds: . carvedilol  3.125 mg Oral BID WC  . heparin  5,000 Units Subcutaneous 3 times per day  . multivitamin with minerals  1 tablet Oral Daily  . sodium chloride  3 mL Intravenous Q12H   Continuous Infusions: . 0.9 % NaCl with KCl 20 mEq / L 75 mL/hr at 07/28/14 0413    Time spent on care of this patient: 60 min   [place], MD 07/28/2014, 12:18 PM  LOS: 1 day   [facility] Office  [phone] Pager - Text Page per [email or web address]  If 7PM-7AM, please contact night-coverage [email or web address]

## 2014-07-28 NOTE — Progress Notes (Signed)
Medication History Reviewed medications - called both pharmacies that this patient has used in the past.  Please note the following: 1.  [facility] - no prescriptions filled since 2014 2.  [facility] - (this is where she states she gets her medications) - the only prescriptions filled have been for Amoxicillin on 04/2014 and Lorazepam - 05/2014.  Pharmacy cannot verify current medications.  Rober Minion, PharmD., MS Clinical Pharmacist Pager:  [phone] Thank you for allowing pharmacy to be part of this patients care team.

## 2014-07-28 NOTE — OR Nursing (Signed)
Dr. Therisa Doyne notified of patient's BP. Order given for hydralazine. Allergy noted by RN and MD. Hulen Skains family to verify reaction to medication. Patient's husband stated that it made her dizzy and unable to walk when taking blood pressure medicine. Dr. Ola Spurr informed of husband's response. Will treat blood pressure with Hydralyzine 5 mg IV and continue to monitor.

## 2014-07-28 NOTE — Progress Notes (Signed)
Dr. Ola Spurr notified about pts current condition.  BP has improved to 194/83 on the left arm with a small adult cuff.  MD ok with that pressure.  Will keep pt in PACU a little longer due to nausea and pain.  Will call MD when ready for sign out.

## 2014-07-28 NOTE — H&P (Addendum)
Hospitalist Admission History and Physical  Patient name: Michelle Gaines record number: [id number] Date of birth: 1928-09-09 Age: 81 y.o. Gender: female  Primary Care Provider: Criselda Peaches, MD  Chief Complaint: mechanical fall, hip fracture   History of Present Illness:This is a 81 y.o. year old female with significant past medical history of atrial flutter, CAD, HTN  presenting with mechanical fall, L hip fracture. Pt was at home when she accidentally fell, landing on her L hip. No CP, SOB, palpitations prior to fall. No head trauma. No LOC.  Presented to ER T 97.8, HR 40s-80s, resp 10s-20s, BP 150s-200s w/ pain, satting 98% on RA. CBC and BMET grossly WNL apart from K 3.0.  CXR and knee XR WNL. L hip xray Mildly comminuted left femoral intertrochanteric fracture, with mild lateral displacement and rotation of the distal femur. EKG NSR EDP discussed case w/ Yates w/ ortho. Will consult in am.   Assessment and Plan: Michelle Gaines is a 81 y.o. year old female presenting with hip fracture, mechanical fall   Active Problems:   Hip fracture   1- Hip fracture -ortho c/s in am  -pain control  2- Fall  -mechanical in etiology -no neurocardiogenic prodrome  3-Atrial flutter -EKG NSR -cont home regimen -tele bed  4-HTN/CAD -stable -no active CP -elevated BP w/ pain  -cont home regimen-follow  5- Hypokalemia -replete  -mag level   FEN/GI: NPO  Prophylaxis: sub q heparin  Disposition: pending further evaluation  Code Status:Full Code    Patient Active Problem List   Diagnosis Date Noted  . Hip fracture 07/28/2014  . Dizziness 04/24/2012  . Restless leg 03/15/2011  . Atrial flutter 08/17/2010  . Essential hypertension 08/16/2010  . CAD 08/16/2010  . PVD 08/16/2010   Past Medical History: Past Medical History  Diagnosis Date  . Hypertension   . Coronary artery disease   . PVD (peripheral vascular disease)   . Peripheral arterial disease   .  Dysrhythmia     ATRIAL FLUTTER  . GERD (gastroesophageal reflux disease)   . Cancer     OVARIAN    1967  . Arthritis   . Atrial flutter     Past Surgical History: Past Surgical History  Procedure Laterality Date  . Bpg    . Pr vein bypass graft,aorto-fem-pop      Right   . Gliayte catherter insertion  06/27/10  . Carotid stent  11/30/09    LAD S/P Right CA DES  . Abdominal hysterectomy    . Atrial flutter ablation N/A 05/22/2013    Procedure: ATRIAL FLUTTER ABLATION;  Surgeon: Evans Lance, MD;  Location[facility] CATH LAB;  Service: Cardiovascular;  Laterality: N/A;    Social History: History   Social History  . Marital Status: Married    Spouse Name: N/A    Number of Children: N/A  . Years of Education: N/A   Social History Main Topics  . Smoking status: Never Smoker   . Smokeless tobacco: Never Used  . Alcohol Use: No  . Drug Use: No  . Sexual Activity: None   Other Topics Concern  . None   Social History Narrative    Family History: Family History  Problem Relation Age of Onset  . COPD Father   . Hypertension Father     Allergies: Allergies  Allergen Reactions  . Fexofenadine Shortness Of Breath  . Cephalosporins Rash  . Amlodipine   . Anaprox [Naproxen Sodium]   . Benzonatate   .  Cephalexin   . Clindamycin/Lincomycin   . Clorazepate   . Codeine   . Diltiazem     Dizziness   . Doxycycline   . Hydralazine   . Labetalol   . Lisinopril   . Phenylephrine   . Phenytoin   . Zolpidem   . Penicillins Nausea And Vomiting, Swelling and Rash    Current Facility-Administered Medications  Medication Dose Route Frequency Provider Last Rate Last Dose  . 0.9 % NaCl with KCl 20 mEq/ L  infusion   Intravenous Continuous Shanda Howells, MD      . carvedilol (COREG) tablet 3.125 mg  3.125 mg Oral BID WC Shanda Howells, MD      . heparin injection 5,000 Units  5,000 Units Subcutaneous 3 times per day Shanda Howells, MD      . HYDROcodone-acetaminophen  (NORCO/VICODIN) 5-325 MG per tablet 1-2 tablet  1-2 tablet Oral Q4H PRN Kalman Drape, MD      . morphine 2 MG/ML injection 2-4 mg  2-4 mg Intravenous Q2H PRN Shanda Howells, MD      . morphine 4 MG/ML injection 4 mg  4 mg Intravenous Q30 min PRN Kalman Drape, MD   4 mg at 07/28/14 0010  . multivitamin (THERAGRAN) per tablet 1 tablet  1 tablet Oral Daily Shanda Howells, MD      . nitroGLYCERIN (NITROSTAT) SL tablet 0.4 mg  0.4 mg Sublingual Q5 min PRN Shanda Howells, MD      . ondansetron [facility]) tablet 4 mg  4 mg Oral Q6H PRN Shanda Howells, MD       Or  . ondansetron [facility]) injection 4 mg  4 mg Intravenous Q6H PRN Shanda Howells, MD      . potassium chloride 10 mEq in 100 mL IVPB  10 mEq Intravenous Q1 Hr x 2 Kalman Drape, MD 100 mL/hr at 07/28/14 0222 10 mEq at 07/28/14 0222  . sodium chloride 0.9 % injection 3 mL  3 mL Intravenous Q12H Shanda Howells, MD       Current Outpatient Prescriptions  Medication Sig Dispense Refill  . Ascorbic Acid (VITAMIN C) 1000 MG tablet Take 1,000 mg by mouth daily.      . carvedilol (COREG) 3.125 MG tablet Take 1 tablet (3.125 mg total) by mouth 2 (two) times daily with a meal. Okay to take one additional tablet daily as needed SBP > 150. 70 tablet 11  . cyanocobalamin 100 MCG tablet Take 100 mcg by mouth daily.    . Flaxseed, Linseed, (FLAX SEED OIL PO) Take 1 tablet by mouth daily.    . LevOCARNitine (L-CARNITINE) 250 MG TABS Take 2 tablets by mouth daily.      Marland Kitchen LORazepam (ATIVAN) 0.5 MG tablet Take 0.5 mg by mouth every 8 (eight) hours as needed for anxiety.    . Lutein 20 MG TABS Take 20 mg by mouth daily.    . magnesium gluconate (MAGONATE) 500 MG tablet Take 500 mg by mouth 2 (two) times daily.      . multivitamin (THERAGRAN) per tablet Take 1 tablet by mouth daily.      . nitroGLYCERIN (NITROSTAT) 0.4 MG SL tablet Place 1 tablet (0.4 mg total) under the tongue every 5 (five) minutes as needed for chest pain. 25 tablet 12  . Potassium 99 MG TABS Take 1  tablet by mouth daily.      Marland Kitchen VITAMIN D, CHOLECALCIFEROL, PO Take 1,000 mg by mouth 2 (two) times daily.    Marland Kitchen  vitamin E 400 UNIT capsule Take 400 Units by mouth daily.     Review Of Systems: 12 point ROS negative except as noted above in HPI.  Physical Exam: Filed Vitals:   07/28/14 0200  BP: 153/68  Pulse: 49  Temp:   Resp: 10    General: cooperative and resting  HEENT: PERRLA and extra ocular movement intact Heart: S1, S2 normal, no murmur, rub or gallop, regular rate and rhythm Lungs: clear to auscultation, no wheezes or rales and unlabored breathing Abdomen: abdomen is soft without significant tenderness, masses, organomegaly or guarding Extremities: L hip TTP, neurovascularly intact distally  Skin:no rashes Neurology: normal without focal findings  Labs and Imaging: Lab Results  Component Value Date/Time   NA 138 07/28/2014 12:12 AM   K 3.0* 07/28/2014 12:12 AM   CL 103 07/28/2014 12:12 AM   CO2 27 07/28/2014 12:12 AM   BUN 22 07/28/2014 12:12 AM   CREATININE 0.89 07/28/2014 12:12 AM   GLUCOSE 157* 07/28/2014 12:12 AM   Lab Results  Component Value Date   WBC 9.3 07/28/2014   HGB 12.2 07/28/2014   HCT 37.1 07/28/2014   MCV 97.1 07/28/2014   PLT 134* 07/28/2014    Dg Chest 1 View  07/28/2014   CLINICAL DATA:  Status post fall; concern for chest injury. Initial encounter.  EXAM: CHEST - 1 VIEW  COMPARISON:  Chest radiograph performed 05/20/2013  FINDINGS: The lungs are well-aerated and clear. There is no evidence of focal opacification, pleural effusion or pneumothorax.  The cardiomediastinal silhouette is mildly enlarged. No acute osseous abnormalities are seen.  IMPRESSION: No acute cardiopulmonary process seen; mild cardiomegaly noted. No displaced rib fractures identified.   Electronically Signed   By: Garald Balding M.D.   On: 07/28/2014 00:20   Dg Knee Complete 4 Views Left  07/27/2014   CLINICAL DATA:  Left knee pain after fall.  EXAM: LEFT KNEE - COMPLETE 4+  VIEW  COMPARISON:  None.  FINDINGS: No acute fracture or dislocation. There is osteoarthritis in the lateral tibial femoral compartment with possible remote prior injury to the lateral tibial plateau. The bones are under mineralized. There is no joint effusion or localizing soft tissue abnormality. Vascular calcifications are seen.  IMPRESSION: No fracture or dislocation of the left knee.   Electronically Signed   By: Jeb Levering M.D.   On: 07/27/2014 23:47   Dg Hip Unilat With Pelvis 2-3 Views Left  07/27/2014   CLINICAL DATA:  Status post fall in garage, with acute onset of left hip pain. Initial encounter.  EXAM: LEFT HIP (WITH PELVIS) 2-3 VIEWS  COMPARISON:  None.  FINDINGS: There is a mildly comminuted left femoral intertrochanteric fracture. Mild lateral displacement and rotation of the distal femur is noted. The left femoral head remains seated at the acetabulum. The right hip joint is unremarkable in appearance.  Degenerative change is noted at the lower lumbar spine and at the sacroiliac joints. The visualized bowel gas pattern is grossly unremarkable, with a stool ball noted at the rectum. Scattered vascular calcifications are seen.  IMPRESSION: 1. Mildly comminuted left femoral intertrochanteric fracture, with mild lateral displacement and rotation of the distal femur. 2. Scattered vascular calcifications seen.   Electronically Signed   By: Garald Balding M.D.   On: 07/27/2014 23:50           Shanda Howells MD  Pager: [phone]

## 2014-07-28 NOTE — Interval H&P Note (Signed)
History and Physical Interval Note:  07/28/2014 1:01 PM  Michelle Gaines  has presented today for surgery, with the diagnosis of Left Intertroch Nail  The various methods of treatment have been discussed with the patient and family. After consideration of risks, benefits and other options for treatment, the patient has consented to  Procedure(s): Left Affixus Nail (Left) as a surgical intervention .  The patient's history has been reviewed, patient examined, no change in status, stable for surgery.  I have reviewed the patient's chart and labs.  Questions were answered to the patient's satisfaction.     Gryphon Vanderveen C

## 2014-07-28 NOTE — Progress Notes (Signed)
Called MD to see if it would be OK to take pt back to room on 5N. BP 174/76.  MD OK to transport.  Will keep pt on continuous pulse ox.

## 2014-07-29 ENCOUNTER — Encounter (HOSPITAL_COMMUNITY): Payer: Self-pay | Admitting: Orthopaedic Surgery

## 2014-07-29 DIAGNOSIS — S72009S Fracture of unspecified part of neck of unspecified femur, sequela: Secondary | ICD-10-CM

## 2014-07-29 LAB — COMPREHENSIVE METABOLIC PANEL
ALBUMIN: 3.4 g/dL — AB (ref 3.5–5.2)
ALT: 21 U/L (ref 0–35)
AST: 32 U/L (ref 0–37)
Alkaline Phosphatase: 54 U/L (ref 39–117)
Anion gap: 9 (ref 5–15)
BILIRUBIN TOTAL: 0.4 mg/dL (ref 0.3–1.2)
BUN: 26 mg/dL — ABNORMAL HIGH (ref 6–23)
CO2: 24 mmol/L (ref 19–32)
CREATININE: 1.21 mg/dL — AB (ref 0.50–1.10)
Calcium: 8.8 mg/dL (ref 8.4–10.5)
Chloride: 105 mmol/L (ref 96–112)
GFR calc Af Amer: 47 mL/min — ABNORMAL LOW (ref >90)
GFR calc non Af Amer: 40 mL/min — ABNORMAL LOW (ref >90)
Glucose, Bld: 126 mg/dL — ABNORMAL HIGH (ref 70–99)
POTASSIUM: 3.6 mmol/L (ref 3.5–5.1)
Sodium: 138 mmol/L (ref 135–145)
Total Protein: 6.2 g/dL (ref 6.0–8.3)

## 2014-07-29 LAB — CBC
HEMATOCRIT: 30.1 % — AB (ref 36.0–46.0)
Hemoglobin: 10 g/dL — ABNORMAL LOW (ref 12.0–15.0)
MCH: 31.7 pg (ref 26.0–34.0)
MCHC: 33.2 g/dL (ref 30.0–36.0)
MCV: 95.6 fL (ref 78.0–100.0)
Platelets: 145 10*3/uL — ABNORMAL LOW (ref 150–400)
RBC: 3.15 MIL/uL — AB (ref 3.87–5.11)
RDW: 14.2 % (ref 11.5–15.5)
WBC: 12.3 10*3/uL — AB (ref 4.0–10.5)

## 2014-07-29 MED ORDER — ENSURE COMPLETE PO LIQD
237.0000 mL | Freq: Three times a day (TID) | ORAL | Status: DC
  Administered 2014-07-29: 237 mL via ORAL

## 2014-07-29 MED ORDER — POTASSIUM CHLORIDE CRYS ER 20 MEQ PO TBCR
40.0000 meq | EXTENDED_RELEASE_TABLET | Freq: Once | ORAL | Status: AC
  Administered 2014-07-29: 40 meq via ORAL
  Filled 2014-07-29: qty 2

## 2014-07-29 NOTE — Progress Notes (Signed)
NUTRITION FOLLOW UP  Pt meets criteria for SEVERE MALNUTRITION in the context of chronic illness as evidenced by severe fat and muscle mass loss.  DOCUMENTATION CODES Per approved criteria  -Severe malnutrition in the context of chronic illness -Underweight   INTERVENTION: Provide Ensure Complete po TID, each supplement provides 350 kcal and 13 grams of protein.  Encourage adequate PO intake.  NUTRITION DIAGNOSIS: Increased nutrient needs related to surgery as evidenced by estimated nutrition needs; ongoing  Goal: Pt to meet >/= 90% of their estimated nutrition needs; not met   Monitor:  PO intake, weight trends, labs, I/O's   81 y.o. female  Admitting Dx: L Hip fracture  ASSESSMENT: Pt with significant past medical history of atrial flutter, CAD, HTN presenting with mechanical fall, L hip fracture. Pt was at home when she accidentally fell, landing on her L hip. L hip xray Mildly comminuted left femoral intertrochanteric fracture, with mild lateral displacement and rotation of the distal femur.  Procedure(2/3)  Left Affixus Nail (Left)  Pt is currently on a regular diet. Meal completion has 30%, however pt reports her appetite is good. RD to order Ensure to aid in caloric and protein needs. Pt was encouraged to eat her food at meals and to drink her supplements.  Labs: Low GFR. High BUN and creatinine.  Height: Ht Readings from Last 1 Encounters:  07/27/14 '5\' 4"'  (1.626 m)    Weight: Wt Readings from Last 1 Encounters:  07/27/14 84 lb (38.102 kg)    BMI:  Body mass index is 14.41 kg/(m^2). Underweight  Re-Estimated Nutritional Needs: Kcal: 1450-1700 Protein: 60-70 grams Fluid: >/= 1.5 L/day  Skin: Incision on upper left leg, non-pitting LLE edema  Diet Order: Diet regular   Intake/Output Summary (Last 24 hours) at 07/29/14 1541 Last data filed at 07/29/14 1100  Gross per 24 hour  Intake 2153.75 ml  Output   1360 ml  Net 793.75 ml    Last BM:  2/2  Labs:   Recent Labs Lab 07/28/14 0012 07/28/14 0218 07/28/14 0243 07/29/14 1015  NA 138  --  140 138  K 3.0*  --  3.2* 3.6  CL 103  --  103 105  CO2 27  --  29 24  BUN 22  --  21 26*  CREATININE 0.89  --  0.92 1.21*  CALCIUM 9.2  --  8.9 8.8  MG  --  2.1  --   --   GLUCOSE 157*  --  157* 126*    CBG (last 3)  No results for input(s): GLUCAP in the last 72 hours.  Scheduled Meds: . carvedilol  3.125 mg Oral BID WC  . cholecalciferol  1,000 Units Oral BID  . docusate sodium  100 mg Oral BID  . heparin subcutaneous  5,000 Units Subcutaneous 3 times per day  . multivitamin with minerals  1 tablet Oral Daily  . potassium chloride  40 mEq Oral Once  . cyanocobalamin  100 mcg Oral Daily  . vitamin E  400 Units Oral Daily    Continuous Infusions: . 0.9 % NaCl with KCl 20 mEq / L 75 mL/hr at 07/28/14 2215    Past Medical History  Diagnosis Date  . Hypertension   . Coronary artery disease   . PVD (peripheral vascular disease)   . Peripheral arterial disease   . Dysrhythmia     ATRIAL FLUTTER  . GERD (gastroesophageal reflux disease)   . Cancer     OVARIAN  1967  . Arthritis   . Atrial flutter     Past Surgical History  Procedure Laterality Date  . Bpg    . Pr vein bypass graft,aorto-fem-pop      Right   . Gliayte catherter insertion  06/27/10  . Carotid stent  11/30/09    LAD S/P Right CA DES  . Abdominal hysterectomy    . Atrial flutter ablation N/A 05/22/2013    Procedure: ATRIAL FLUTTER ABLATION;  Surgeon: Evans Lance, MD;  Location: [facility] CATH LAB;  Service: Cardiovascular;  Laterality: N/A;  . Intramedullary (im) nail intertrochanteric Left 07/28/2014    Procedure: Left Affixus Nail;  Surgeon: Marybelle Killings, MD;  Location: [place];  Service: Orthopedics;  Laterality: Left;    Kallie Locks, MS, RD, LDN Pager # [phone] After hours/ weekend pager # [phone]

## 2014-07-29 NOTE — Progress Notes (Signed)
Patient Demographics  Michelle Gaines, is a 81 y.o. female, DOB - January 15, 1929, QTM:[id number]  Admit date - 07/27/2014   Admitting Physician Michelle Howells, MD  Outpatient Primary MD for the patient is Gaines, Michelle Bachelor, MD  LOS - 2   Chief Complaint  Patient presents with  . Fall      Brief narrative/history of present illness:  81 y.o. year old female with significant past medical history of atrial flutter, CAD, HTN presenting with mechanical fall, L hip fracture. Pt was at home when she accidentally fell, landing on her L hip. No CP, SOB, palpitations prior to fall. No head trauma. No LOC.  CXR and knee XR WNL. L hip xray Mildly comminuted left femoral intertrochanteric fracture, with mild lateral displacement and rotation of the distal femur. Patient had surgical repair on 07/29/14 by Michelle Gaines.   Subjective:   Michelle Gaines today has, No headache, No chest pain, No abdominal pain - No Nausea, No new weakness tingling or numbness, No Cough - SOB. Complaints of left hip pain.  Assessment & Plan    Principal Problem:   Hip fracture Active Problems:   Essential hypertension   Coronary atherosclerosis   Atrial flutter   Protein-calorie malnutrition, severe  Hip fracture - Status post surgical repair by Michelle Gaines on 2/3. - PT/OT consulted    Essential hypertension - cont Carvedilol   Coronary atherosclerosis -with stenting - no c/o chest pain- noted that she is not on ASA at home   Atrial flutter - s/p catheter ablation on 11./14- not on anticoagulation  Leukocytosis - Most likely stress related from surgery, patient is a febrile, negative urinalysis and chest x-ray. - Continue to monitor.  Code Status: Full code  Family Communication: None at bedside  Disposition Plan: SNF versus home with home PT   Procedures  Left hip fracture  surgical repair   Consults   Orthopedic   Medications  Scheduled Meds: . carvedilol  3.125 mg Oral BID WC  . cholecalciferol  1,000 Units Oral BID  . docusate sodium  100 mg Oral BID  . heparin subcutaneous  5,000 Units Subcutaneous 3 times per day  . multivitamin with minerals  1 tablet Oral Daily  . cyanocobalamin  100 mcg Oral Daily  . vitamin E  400 Units Oral Daily   Continuous Infusions: . 0.9 % NaCl with KCl 20 mEq / L 75 mL/hr at 07/28/14 2215   PRN Meds:.acetaminophen **OR** acetaminophen, bisacodyl, HYDROcodone-acetaminophen, LORazepam, menthol-cetylpyridinium **OR** phenol, metoCLOPramide **OR** metoCLOPramide (REGLAN) injection, morphine injection, nitroGLYCERIN, ondansetron **OR** ondansetron (ZOFRAN) IV, polyethylene glycol  DVT Prophylaxis  heparin  Lab Results  Component Value Date   PLT 145* 07/29/2014    Antibiotics    Anti-infectives    Start     Dose/Rate Route Frequency Ordered Stop   07/28/14 1707  vancomycin (VANCOCIN) 1 GM/200ML IVPB  Status:  Discontinued    Comments:  Claybon Jabs   : cabinet override      07/28/14 1707 07/28/14 1859          Objective:   Filed Vitals:   07/29/14 0500 07/29/14 0800 07/29/14 1151 07/29/14 1214  BP: 138/62   164/71  Pulse: 70     Temp: 98.5 F (  36.9 C)   97.6 F (36.4 C)  TempSrc: Oral   Oral  Resp: 16 18 18 18   Height:      Weight:      SpO2: 99%   99%    Wt Readings from Last 3 Encounters:  07/27/14 38.102 kg (84 lb)  12/29/13 38.284 kg (84 lb 6.4 oz)  08/24/13 38.919 kg (85 lb 12.8 oz)     Intake/Output Summary (Last 24 hours) at 07/29/14 1442 Last data filed at 07/29/14 1100  Gross per 24 hour  Intake 2153.75 ml  Output   1360 ml  Net 793.75 ml     Physical Exam  Awake Alert, Oriented X 3, No new F.N deficits, Normal affect [place].AT,PERRAL Supple Neck,No JVD, No cervical lymphadenopathy appriciated.  Symmetrical Chest wall movement, Good air movement bilaterally, CTAB RRR,No  Gallops,Rubs or new Murmurs, No Parasternal Heave +ve B.Sounds, Abd Soft, No tenderness, No organomegaly appriciated, No rebound - guarding or rigidity. No Cyanosis, Clubbing or edema, No new Rash or bruise     Data Review   Micro Results No results found for this or any previous visit (from the past 240 hour(s)).  Radiology Reports Dg Chest 1 View  07/28/2014   CLINICAL DATA:  Status post fall; concern for chest injury. Initial encounter.  EXAM: CHEST - 1 VIEW  COMPARISON:  Chest radiograph performed 05/20/2013  FINDINGS: The lungs are well-aerated and clear. There is no evidence of focal opacification, pleural effusion or pneumothorax.  The cardiomediastinal silhouette is mildly enlarged. No acute osseous abnormalities are seen.  IMPRESSION: No acute cardiopulmonary process seen; mild cardiomegaly noted. No displaced rib fractures identified.   Electronically Signed   By: Garald Balding M.D.   On: 07/28/2014 00:20   Dg Knee Complete 4 Views Left  07/27/2014   CLINICAL DATA:  Left knee pain after fall.  EXAM: LEFT KNEE - COMPLETE 4+ VIEW  COMPARISON:  None.  FINDINGS: No acute fracture or dislocation. There is osteoarthritis in the lateral tibial femoral compartment with possible remote prior injury to the lateral tibial plateau. The bones are under mineralized. There is no joint effusion or localizing soft tissue abnormality. Vascular calcifications are seen.  IMPRESSION: No fracture or dislocation of the left knee.   Electronically Signed   By: Jeb Levering M.D.   On: 07/27/2014 23:47   Dg C-arm 1-60 Min  07/28/2014   CLINICAL DATA:  81 year old female with left intertrochanteric femoral fracture  EXAM: DG C-ARM 61-120 MIN; LEFT FEMUR 2 VIEWS  COMPARISON:  Preoperative radiographs 07/27/2014  FINDINGS: Interval ORIF of intertrochanteric left femoral fracture with intra medullary nail including 2 distal interlocking femoral artery. Screws and proximal Affixus nail. No evidence of immediate  hardware complication. Alignment of the fracture fragments is improved compared to the preoperative radiographs. Atherosclerotic calcifications present in the common femoral artery.  IMPRESSION: 1. ORIF of intertrochanteric left femoral fracture as described above without evidence of hardware complication. 2. Atherosclerotic vascular calcifications.   Electronically Signed   By: Jacqulynn Cadet M.D.   On: 07/28/2014 18:46   Dg Hip Unilat With Pelvis 2-3 Views Left  07/27/2014   CLINICAL DATA:  Status post fall in garage, with acute onset of left hip pain. Initial encounter.  EXAM: LEFT HIP (WITH PELVIS) 2-3 VIEWS  COMPARISON:  None.  FINDINGS: There is a mildly comminuted left femoral intertrochanteric fracture. Mild lateral displacement and rotation of the distal femur is noted. The left femoral head remains seated at the acetabulum.  The right hip joint is unremarkable in appearance.  Degenerative change is noted at the lower lumbar spine and at the sacroiliac joints. The visualized bowel gas pattern is grossly unremarkable, with a stool ball noted at the rectum. Scattered vascular calcifications are seen.  IMPRESSION: 1. Mildly comminuted left femoral intertrochanteric fracture, with mild lateral displacement and rotation of the distal femur. 2. Scattered vascular calcifications seen.   Electronically Signed   By: Garald Balding M.D.   On: 07/27/2014 23:50   Dg Femur Min 2 Views Left  07/28/2014   CLINICAL DATA:  81 year old female with left intertrochanteric femoral fracture  EXAM: DG C-ARM 61-120 MIN; LEFT FEMUR 2 VIEWS  COMPARISON:  Preoperative radiographs 07/27/2014  FINDINGS: Interval ORIF of intertrochanteric left femoral fracture with intra medullary nail including 2 distal interlocking femoral artery. Screws and proximal Affixus nail. No evidence of immediate hardware complication. Alignment of the fracture fragments is improved compared to the preoperative radiographs. Atherosclerotic  calcifications present in the common femoral artery.  IMPRESSION: 1. ORIF of intertrochanteric left femoral fracture as described above without evidence of hardware complication. 2. Atherosclerotic vascular calcifications.   Electronically Signed   By: Jacqulynn Cadet M.D.   On: 07/28/2014 18:46    CBC  Recent Labs Lab 07/28/14 0012 07/28/14 0243 07/29/14 1015  WBC 9.3 8.9 12.3*  HGB 12.2 11.2* 10.0*  HCT 37.1 33.5* 30.1*  PLT 134* 132* 145*  MCV 97.1 94.9 95.6  MCH 31.9 31.7 31.7  MCHC 32.9 33.4 33.2  RDW 13.5 13.5 14.2  LYMPHSABS 0.8 0.9  --   MONOABS 0.4 0.4  --   EOSABS 0.1 0.0  --   BASOSABS 0.0 0.0  --     Chemistries   Recent Labs Lab 07/28/14 0012 07/28/14 0218 07/28/14 0243 07/29/14 1015  NA 138  --  140 138  K 3.0*  --  3.2* 3.6  CL 103  --  103 105  CO2 27  --  29 24  GLUCOSE 157*  --  157* 126*  BUN 22  --  21 26*  CREATININE 0.89  --  0.92 1.21*  CALCIUM 9.2  --  8.9 8.8  MG  --  2.1  --   --   AST 32  --  28 32  ALT 24  --  22 21  ALKPHOS 61  --  58 54  BILITOT 0.9  --  1.0 0.4   ------------------------------------------------------------------------------------------------------------------ estimated creatinine clearance is 21.2 mL/min (by C-G formula based on Cr of 1.21). ------------------------------------------------------------------------------------------------------------------ No results for input(s): HGBA1C in the last 72 hours. ------------------------------------------------------------------------------------------------------------------ No results for input(s): CHOL, HDL, LDLCALC, TRIG, CHOLHDL, LDLDIRECT in the last 72 hours. ------------------------------------------------------------------------------------------------------------------ No results for input(s): TSH, T4TOTAL, T3FREE, THYROIDAB in the last 72 hours.  Invalid input(s):  FREET3 ------------------------------------------------------------------------------------------------------------------ No results for input(s): VITAMINB12, FOLATE, FERRITIN, TIBC, IRON, RETICCTPCT in the last 72 hours.  Coagulation profile  Recent Labs Lab 07/28/14 0012  INR 1.10    No results for input(s): DDIMER in the last 72 hours.  Cardiac Enzymes No results for input(s): CKMB, TROPONINI, MYOGLOBIN in the last 168 hours.  Invalid input(s): CK ------------------------------------------------------------------------------------------------------------------ Invalid input(s): POCBNP     Time Spent in minutes   30 minutes   Marlow Hendrie M.D on 07/29/2014 at 2:42 PM  Between 7am to 7pm - Pager - [phone]  After 7pm go to [email or web address] - password [email or web address]  And look for the night coverage person covering for me after hours  [facility]  Group Office  [phone]   **Disclaimer: This note may have been dictated with voice recognition software. Similar sounding words can inadvertently be transcribed and this note may contain transcription errors which may not have been corrected upon publication of note.**

## 2014-07-29 NOTE — Evaluation (Addendum)
Physical Therapy Evaluation Patient Details Name: Michelle Gaines MRN: [id number] DOB: April 14, 1929 Today's Date: 07/29/2014   History of Present Illness  Pt. is   an 81 yo lady who fell sustaining a displaced L intertrochanteric hip fx, now s/p L Affixus nail.    Clinical Impression  Pt. presents with a decrease in her usual level of independent function , and  she will benefit from acute PT to address her mobility and gait deficits for hopeful return home.  She tolerated first time up amazingly well!  She may be able to progress enough to go directly home.  Will watch her progress tomorrow.      Follow Up Recommendations Home health PT;Supervision/Assistance - 24 hour;Supervision for mobility/OOB (home as long as progress is adequate)    Equipment Recommendations  Rolling walker with 5" wheels    Recommendations for Other Services       Precautions / Restrictions Precautions Precautions: Fall Restrictions Weight Bearing Restrictions: Yes LLE Weight Bearing: Weight bearing as tolerated      Mobility  Bed Mobility Overal bed mobility: Needs Assistance Bed Mobility: Supine to Sit     Supine to sit: Min assist     General bed mobility comments: pt. used bed rail to assist self to EOB with only min assist needed from this PT for L LE  Transfers Overall transfer level: Needs assistance Equipment used: Rolling walker (2 wheeled) Transfers: Sit to/from Stand Sit to Stand: Min assist         General transfer comment: min assist to rise to stand and cues for hand placement and sequencing  Ambulation/Gait Ambulation/Gait assistance: Min assist Ambulation Distance (Feet): 10 Feet Assistive device: Rolling walker (2 wheeled) Gait Pattern/deviations: Step-to pattern;Decreased step length - left;Decreased stance time - left;Decreased weight shift to left;Antalgic Gait velocity: decreased   General Gait Details: Pt. hesitant to bear weight L LE due to pain, appeared stable  with use of RW; tech assist for chair follow  Stairs            Wheelchair Mobility    Modified Rankin (Stroke Patients Only)       Balance Overall balance assessment: Needs assistance Sitting-balance support: No upper extremity supported;Feet supported Sitting balance-Leahy Scale: Good     Standing balance support: Bilateral upper extremity supported;During functional activity Standing balance-Leahy Scale: Poor Standing balance comment: poor due to need for RW support                             Pertinent Vitals/Pain Pain Assessment: 0-10 Pain Score: 3  Pain Location: left distal leg Pain Descriptors / Indicators: Sore Pain Intervention(s): Limited activity within patient's tolerance;Monitored during session;Repositioned    Home Living Family/patient expects to be discharged to:: Private residence Living Arrangements: Spouse/significant other Available Help at Discharge: Available 24 hours/day Type of Home: House Home Access: Stairs to enter Entrance Stairs-Rails: None Entrance Stairs-Number of Steps: 1 Home Layout: One level Home Equipment: Other (comment);Walker - 2 wheels ("hurry cane")      Prior Function Level of Independence: Independent               Hand Dominance        Extremity/Trunk Assessment   Upper Extremity Assessment: Overall WFL for tasks assessed           Lower Extremity Assessment: Overall WFL for tasks assessed;LLE deficits/detail   LLE Deficits / Details: good ankle pump, good quad set; as  anticipated, strength limited at hip and knee  Cervical / Trunk Assessment: Normal  Communication   Communication: No difficulties  Cognition Arousal/Alertness: Awake/alert Behavior During Therapy: WFL for tasks assessed/performed Overall Cognitive Status: Within Functional Limits for tasks assessed                      General Comments      Exercises General Exercises - Lower Extremity Ankle  Circles/Pumps: AROM;Both;10 reps;Supine Quad Sets: AROM;Both;10 reps;Supine Long Arc Quad: AROM;Left;5 reps;Seated      Assessment/Plan    PT Assessment Patient needs continued PT services  PT Diagnosis Difficulty walking;Acute pain   PT Problem List Decreased strength;Decreased activity tolerance;Decreased balance;Decreased mobility;Decreased knowledge of use of DME;Decreased knowledge of precautions;Pain  PT Treatment Interventions DME instruction;Gait training;Stair training;Functional mobility training;Therapeutic activities;Therapeutic exercise;Balance training;Patient/family education   PT Goals (Current goals can be found in the Care Plan section) Acute Rehab PT Goals Patient Stated Goal: home with husband PT Goal Formulation: With patient Time For Goal Achievement: 08/05/14 Potential to Achieve Goals: Good    Frequency Min 5X/week   Barriers to discharge        Co-evaluation               End of Session Equipment Utilized During Treatment: Gait belt Activity Tolerance: Patient tolerated treatment well Patient left: in chair;with call bell/phone within reach Nurse Communication: Mobility status         Time: 0852-0916 PT Time Calculation (min) (ACUTE ONLY): 24 min   Charges:   PT Evaluation $Initial PT Evaluation Tier I: 1 Procedure PT Treatments $Gait Training: 8-22 mins   PT G CodesLadona Ridgel 07/29/2014, 9:41 AM [name] PT Acute Rehab Services [phone] [place] [phone]

## 2014-07-29 NOTE — OR Nursing (Signed)
Late entry, delay code posted at [phone]

## 2014-07-29 NOTE — Op Note (Signed)
Michelle Gaines, Michelle Gaines             ACCOUNT NO.:  [id number]  MEDICAL RECORD NO.:  [id number]  LOCATION:  5N11C                        FACILITY:  [place]  PHYSICIAN:  Mark C. Lorin Mercy, M.D.    DATE OF BIRTH:  08-18-1928  DATE OF PROCEDURE:  07/28/2014 DATE OF DISCHARGE:                              OPERATIVE REPORT   PREOPERATIVE DIAGNOSIS:  Displaced left intertrochanteric fracture.  POSTOPERATIVE DIAGNOSIS:  Displaced left intertrochanteric fracture.  PROCEDURE:  Left Affixus trochanteric nail with proximal distal interlock.  SURGEON:  Mark C. Lorin Mercy, M.D.  ANESTHESIA:  General.  ESTIMATED BLOOD LOSS:  Minimal.  DESCRIPTION OF PROCEDURE:  After induction of general anesthesia, the patient was placed on the fracture table.  Careful padding, positioning, well leg holder was used.  Reduction was performed, checked under fluoroscopy,  excellent position and alignment.  Neck was parallel with the floor.  Hip was prepped with DuraPrep.  Large shower curtain Steri- Drape was applied after the area squared with towels, held with a stapler.  A time-out procedure was completed.  Incision was made proximal to the trochanter.  Gluteus medius was split.  The patient was thin, tip was easily palpated.  K-wire was drilled, checked under fluoroscopy, over-reamed, and then measured with beaded tip rod down at the knee, checked under fluoroscopy, measured 380, 360 was selected. 360 x 11 nail was inserted, checked under fluoroscopy until it was in good position, low on AP x-ray, going up the neck, and screw was inserted just slightly posterior in the head.  Measured 100, 90 mm nail was selected, it was not reamed.  Screw was inserted, tightened down, locked.  Side-arm was removed and then freehand technique was used for 2 distal interlocks that were 40 and 44 mm, freehand technique.  Confirmed on final x-ray, showing excellent position of the fracture alignment, position, and good reduction.   Irrigation, closure of gluteus medius with #1 Vicryl, 2-0 Vicryl subcutaneous tissue, skin staple closure. Marcaine infiltration, total of 10 mL, 0.25%.  The patient was transferred to recovery room in stable condition.     Mark C. Lorin Mercy, M.D.     MCY/MEDQ  D:  07/28/2014  T:  07/29/2014  Job:  [id number]

## 2014-07-29 NOTE — Progress Notes (Signed)
Subjective: 1 Day Post-Op Procedure(s) (LRB): Left Affixus Nail (Left) Patient reports pain as 4 on 0-10 scale.    Objective: Vital signs in last 24 hours: Temp:  [97.2 F (36.2 C)-98.5 F (36.9 C)] 98.5 F (36.9 C) (02/04 0500) Pulse Rate:  [61-89] 70 (02/04 0500) Resp:  [9-18] 16 (02/04 0500) BP: (138-243)/(62-123) 138/62 mmHg (02/04 0500) SpO2:  [96 %-100 %] 99 % (02/04 0500)  Intake/Output from previous day: 02/03 0701 - 02/04 0700 In: 1953.8 [I.V.:1403.8] Out: 1360 [Urine:1360] Intake/Output this shift:     Recent Labs  07/28/14 0012 07/28/14 0243  HGB 12.2 11.2*    Recent Labs  07/28/14 0012 07/28/14 0243  WBC 9.3 8.9  RBC 3.82* 3.53*  HCT 37.1 33.5*  PLT 134* 132*    Recent Labs  07/28/14 0012 07/28/14 0243  NA 138 140  K 3.0* 3.2*  CL 103 103  CO2 27 29  BUN 22 21  CREATININE 0.89 0.92  GLUCOSE 157* 157*  CALCIUM 9.2 8.9    Recent Labs  07/28/14 0012  INR 1.10    Neurologically intact  Assessment/Plan: 1 Day Post-Op Procedure(s) (LRB): Left Affixus Nail (Left) Up with therapy  Likely  SNF. SL iv  Lachlyn Vanderstelt C 07/29/2014, 7:49 AM

## 2014-07-29 NOTE — Progress Notes (Signed)
Physical Therapy Treatment Patient Details Name: Michelle Gaines MRN: [id number] DOB: 20-Jan-1929 Today's Date: 07/29/2014    History of Present Illness Pt. is   an 81 yo lady who fell sustaining a displaced L intertrochanteric hip fx, now s/p L Affixus nail.      PT Comments    Nice progress this pm,.  I am hopeful that with 2 sessions tomorrow, she may be ready for DC from PT standpoint.    Follow Up Recommendations  Home health PT;Supervision/Assistance - 24 hour;Supervision for mobility/OOB     Equipment Recommendations  3in1 (PT) (husband states pt. has a new RW)    Recommendations for Other Services       Precautions / Restrictions Precautions Precautions: Fall Restrictions Weight Bearing Restrictions: Yes LLE Weight Bearing: Weight bearing as tolerated    Mobility  Bed Mobility Overal bed mobility: Needs Assistance Bed Mobility: Sit to Supine     Supine to sit: Supervision;HOB elevated Sit to supine: Supervision   General bed mobility comments: pt. managed well with minimal use of bed rail but no physical assist  Transfers Overall transfer level: Needs assistance Equipment used: Rolling walker (2 wheeled) Transfers: Sit to/from Stand Sit to Stand: Min guard         General transfer comment: min guard assist for safety; no physical assist needed; one reminder for hand placement then good follow through  Ambulation/Gait Ambulation/Gait assistance: Min guard Ambulation Distance (Feet): 50 Feet Assistive device: Rolling walker (2 wheeled) Gait Pattern/deviations: Step-to pattern;Decreased step length - left Gait velocity: decreased   General Gait Details: pt. with improved tolerance to weightbearing on left LE this pm.  Min guard assist for safety.  Pt. walked into bathroom , + urination.  RN [facility] aware.     Stairs            Wheelchair Mobility    Modified Rankin (Stroke Patients Only)       Balance                                     Cognition Arousal/Alertness: Awake/alert Behavior During Therapy: WFL for tasks assessed/performed Overall Cognitive Status: Within Functional Limits for tasks assessed                      Exercises      General Comments        Pertinent Vitals/Pain Pain Assessment: 0-10 Pain Score: 2  Pain Location: left leg Pain Descriptors / Indicators: Sore Pain Intervention(s): Limited activity within patient's tolerance;Monitored during session;Repositioned    Home Living                      Prior Function            PT Goals (current goals can now be found in the care plan section) Progress towards PT goals: Progressing toward goals    Frequency  Min 5X/week    PT Plan Current plan remains appropriate    Co-evaluation             End of Session Equipment Utilized During Treatment: Gait belt Activity Tolerance: Patient tolerated treatment well Patient left: in bed;with call bell/phone within reach;with family/visitor present;with bed alarm set     Time: 1351-1418 PT Time Calculation (min) (ACUTE ONLY): 27 min  Charges:  $Gait Training: 8-22 mins $Therapeutic Activity: 8-22 mins  G CodesLadona Ridgel 07/29/2014, 2:33 PM [name] PT Acute Rehab Services [phone] [place]

## 2014-07-30 LAB — COMPREHENSIVE METABOLIC PANEL
ALT: 18 U/L (ref 0–35)
ANION GAP: 6 (ref 5–15)
AST: 27 U/L (ref 0–37)
Albumin: 3.2 g/dL — ABNORMAL LOW (ref 3.5–5.2)
Alkaline Phosphatase: 52 U/L (ref 39–117)
BILIRUBIN TOTAL: 0.8 mg/dL (ref 0.3–1.2)
BUN: 19 mg/dL (ref 6–23)
CO2: 25 mmol/L (ref 19–32)
Calcium: 8.8 mg/dL (ref 8.4–10.5)
Chloride: 108 mmol/L (ref 96–112)
Creatinine, Ser: 0.77 mg/dL (ref 0.50–1.10)
GFR calc Af Amer: 88 mL/min — ABNORMAL LOW (ref >90)
GFR, EST NON AFRICAN AMERICAN: 76 mL/min — AB (ref >90)
Glucose, Bld: 124 mg/dL — ABNORMAL HIGH (ref 70–99)
POTASSIUM: 3.6 mmol/L (ref 3.5–5.1)
Sodium: 139 mmol/L (ref 135–145)
TOTAL PROTEIN: 5.6 g/dL — AB (ref 6.0–8.3)

## 2014-07-30 LAB — CBC
HCT: 26.7 % — ABNORMAL LOW (ref 36.0–46.0)
Hemoglobin: 8.7 g/dL — ABNORMAL LOW (ref 12.0–15.0)
MCH: 31.1 pg (ref 26.0–34.0)
MCHC: 32.6 g/dL (ref 30.0–36.0)
MCV: 95.4 fL (ref 78.0–100.0)
Platelets: 106 10*3/uL — ABNORMAL LOW (ref 150–400)
RBC: 2.8 MIL/uL — ABNORMAL LOW (ref 3.87–5.11)
RDW: 14.3 % (ref 11.5–15.5)
WBC: 9.6 10*3/uL (ref 4.0–10.5)

## 2014-07-30 MED ORDER — DOCUSATE SODIUM 100 MG PO CAPS: 100.0000 mg | ORAL_CAPSULE | Freq: Every day | ORAL | Status: DC

## 2014-07-30 MED ORDER — CLONIDINE HCL 0.1 MG PO TABS: 0.1000 mg | ORAL_TABLET | Freq: Every day | ORAL | Status: DC

## 2014-07-30 MED ORDER — HYDROCODONE-ACETAMINOPHEN 5-325 MG PO TABS: 1.0000 | ORAL_TABLET | Freq: Four times a day (QID) | ORAL | Status: DC | PRN

## 2014-07-30 MED ORDER — ASPIRIN 325 MG PO TABS: 325.0000 mg | ORAL_TABLET | Freq: Every day | ORAL | Status: DC

## 2014-07-30 NOTE — Evaluation (Signed)
Occupational Therapy Evaluation Patient Details Name: TRACIA LACOMB MRN: [id number] DOB: Nov 24, 1928 Today's Date: 07/30/2014    History of Present Illness Pt. is   an 81 yo lady who fell sustaining a displaced L intertrochanteric hip fx, now s/p L Affixus nail.     Clinical Impression   Patient admitted with above. Patient independent PTA. Patient currently functioning at an overall min guard assist level for functional mobility and up to total assist for LB ADLs. D/C from acute OT services and defer services to [facility], as patient plans to discharge home today. All appropriate education provided to patient and husband. Please re-order acute OT if needed.      Follow Up Recommendations  Home health OT;Supervision/Assistance - 24 hour    Equipment Recommendations  3 in 1 bedside comode;Other (comment) (AE)    Recommendations for Other Services  None at this time     Precautions / Restrictions Precautions Precautions: Fall Restrictions Weight Bearing Restrictions: Yes LLE Weight Bearing: Weight bearing as tolerated      Mobility - Per PT note Bed Mobility Overal bed mobility: Needs Assistance Bed Mobility: Sit to Supine       Sit to supine: Supervision   General bed mobility comments: I educated pt. that she can use a sheet to self assist L LE into bed; she liked this technique and does what she can do to be as independent as possible  Transfers Overall transfer level: Needs assistance Equipment used: Rolling walker (2 wheeled) Transfers: Sit to/from Stand Sit to Stand: Supervision         General transfer comment: good technique and hand placement ; supervision for safety, no physical assist needed    Balance  Overall balance assessment: Needs assistance Sitting-balance support: No upper extremity supported;Feet supported Sitting balance-Leahy Scale: Good     Standing balance support: Bilateral upper extremity supported;During functional activity Standing  balance-Leahy Scale: Fair     ADL Overall ADL's : Needs assistance/impaired Eating/Feeding: Set up;Sitting   Grooming: Standing;Supervision/safety;Wash/dry hands   Upper Body Bathing: Set up;Sitting   Lower Body Bathing: Sit to/from stand;Moderate assistance   Upper Body Dressing : Set up;Sitting   Lower Body Dressing: Total assistance;Sit to/from stand;Cueing for safety   Toilet Transfer: Min guard;RW;BSC;Ambulation   Toileting- Clothing Manipulation and Hygiene: Supervision/safety;Sit to/from stand;Cueing for safety       Functional mobility during ADLs: Min guard;Rolling walker;Cueing for safety General ADL Comments: Patient's husband present for education. Educated both on use of AE and demonstrated each use with teach back from patient. Patient supposed to d/c today. Recommending HHOT, defering services to [facility] as no more acute needs at this time. Patient will require 24/7 supervision/assistance and husband able to provide this.                Pertinent Vitals/Pain Pain Assessment: 0-10 Pain Score: 3  Pain Location: LLE Pain Descriptors / Indicators: Sore Pain Intervention(s): Monitored during session     Hand Dominance Right   Extremity/Trunk Assessment Upper Extremity Assessment Upper Extremity Assessment: Overall WFL for tasks assessed   Lower Extremity Assessment Lower Extremity Assessment: Overall WFL for tasks assessed   Cervical / Trunk Assessment Cervical / Trunk Assessment: Normal   Communication Communication Communication: No difficulties   Cognition Arousal/Alertness: Awake/alert Behavior During Therapy: WFL for tasks assessed/performed Overall Cognitive Status: Within Functional Limits for tasks assessed     Exercises Exercises: General Lower Extremity     Shoulder Instructions      Home Living  Family/patient expects to be discharged to:: Private residence Living Arrangements: Spouse/significant other Available Help at Discharge:  Available 24 hours/day;Family (husband) Type of Home: House Home Access: Stairs to enter [employer] of Steps: 1 Entrance Stairs-Rails: None Home Layout: One level     Bathroom Shower/Tub: Walk-in shower;Door   [employer] Toilet: Standard     Home Equipment: Environmental consultant - 2 wheels;Cane - quad;Shower seat          Prior Functioning/Environment Level of Independence: Independent             OT Diagnosis: Generalized weakness;Acute pain   OT Problem List:  n/a, no acute OT needs   OT Treatment/Interventions:   n/a, no acute OT needs   OT Goals(Current goals can be found in the care plan section) Acute Rehab OT Goals Patient Stated Goal: go home!  OT Frequency:     Barriers to D/C:  None known at this time          End of Session Equipment Utilized During Treatment: Rolling walker  Activity Tolerance: Patient tolerated treatment well Patient left: in chair;with call bell/phone within reach;with family/visitor present   Time: 7425-9563 OT Time Calculation (min): 26 min Charges:  OT General Charges $OT Visit: 1 Procedure OT Evaluation $Initial OT Evaluation Tier I: 1 Procedure OT Treatments $Self Care/Home Management : 8-22 mins  Kellen Dutch , MS, OTR/L, CLT Pager: [phone]  07/30/2014, 2:00 PM

## 2014-07-30 NOTE — Progress Notes (Signed)
Physical Therapy Treatment Patient Details Name: Michelle Gaines MRN: [id number] DOB: Jul 31, 1928 Today's Date: 07/30/2014    History of Present Illness Pt. is   an 81 yo lady who fell sustaining a displaced L intertrochanteric hip fx, now s/p L Affixus nail.      PT Comments    Pt. Progressing very nicely. Will see her again this pm once husband arrives for family teaching.  SHe is progressing such that she does not appear to need SNF level therapies but will need HHPT and 24 hour assist in the home environment.    Follow Up Recommendations  Home health PT;Supervision/Assistance - 24 hour;Supervision for mobility/OOB     Equipment Recommendations  3in1 (PT)    Recommendations for Other Services       Precautions / Restrictions Precautions Precautions: Fall Restrictions Weight Bearing Restrictions: Yes LLE Weight Bearing: Weight bearing as tolerated    Mobility  Bed Mobility Overal bed mobility: Needs Assistance Bed Mobility: Sit to Supine       Sit to supine: Supervision   General bed mobility comments: I educated pt. that she can use a sheet to self assist L LE into bed; she liked this technique and does what she can do to be as independent as possible  Transfers Overall transfer level: Needs assistance Equipment used: Rolling walker (2 wheeled) Transfers: Sit to/from Stand Sit to Stand: Supervision         General transfer comment: good technique and hand placement ; supervision for safety, no physical assist needed  Ambulation/Gait Ambulation/Gait assistance: Supervision Ambulation Distance (Feet): 100 Feet (50' x 2) Assistive device: Rolling walker (2 wheeled) Gait Pattern/deviations: Step-to pattern;Decreased stride length Gait velocity: decreased   General Gait Details: Pt. able to ambulate 50', needed seated rest then returned to room at additional 50'.  Good sequencing, no overt LOB and steadiness definitely improving.     Stairs             Wheelchair Mobility    Modified Rankin (Stroke Patients Only)       Balance                                    Cognition Arousal/Alertness: Awake/alert Behavior During Therapy: WFL for tasks assessed/performed Overall Cognitive Status: Within Functional Limits for tasks assessed                      Exercises General Exercises - Lower Extremity Ankle Circles/Pumps: AROM;Both;10 reps;Supine Quad Sets: AROM;Both;10 reps;Supine Short Arc Quad: AROM;Left;10 reps;Supine Heel Slides: AAROM;Left;10 reps;Supine Straight Leg Raises: AAROM;Left;10 reps;Supine    General Comments        Pertinent Vitals/Pain Pain Assessment: 0-10 Pain Score: 2  Pain Location: left leg Pain Descriptors / Indicators: Sore Pain Intervention(s): Monitored during session;Repositioned    Home Living                      Prior Function            PT Goals (current goals can now be found in the care plan section) Progress towards PT goals: Progressing toward goals    Frequency  Min 5X/week    PT Plan Current plan remains appropriate    Co-evaluation             End of Session Equipment Utilized During Treatment: Gait belt Activity Tolerance: Patient tolerated treatment well Patient  left: in bed;with call bell/phone within reach;with bed alarm set;Other (comment) (pt. requested back to bed)     Time: 9038-3338 PT Time Calculation (min) (ACUTE ONLY): 29 min  Charges:  $Gait Training: 8-22 mins $Therapeutic Exercise: 8-22 mins                    G Codes:      [name] 07/30/2014, 10:24 AM [name] PT Acute Rehab Services [phone] [place] [phone]

## 2014-07-30 NOTE — Progress Notes (Signed)
CARE MANAGEMENT NOTE 07/30/2014  Patient:  Michelle Gaines, Michelle Gaines   Account Number:  [id number]  Date Initiated:  07/30/2014  Documentation initiated by:  [facility]  Subjective/Objective Assessment:   fell,s/p  left femur IM nail     Action/Plan:   PT/OT evals- recommended HHPT   Anticipated DC Date:  07/31/2014   Anticipated DC Plan:  [place]  CM consult      [facility] Choice  HOME HEALTH   Choice offered to / List presented to:  C-1 Patient        Parks arranged  [place] PT      [place].   Status of service:  Completed, signed off Medicare Important Message given?  YES Date Medicare IM given:  07/30/2014 Medicare IM given by:  [facility] Discharge Disposition:  [place]  Per UR Regulation:  Reviewed for med. necessity/level of care/duration of stay   Comments:  07/30/14 Spoke with patient about HHC, she selected [facility]. Contacted Marie at [employer] and set up [place]. Patient states that she does not want a 3N1 and  has a rolling walker.Patient states that she will have her husband to assist her after d/c. Will continue to follow until d/c.

## 2014-07-30 NOTE — Discharge Summary (Signed)
[name], 81 y.o., DOB 1929/04/03, MRN [id number]. Admission date: 07/27/2014 Discharge Date 07/30/2014 Primary MD Criselda Peaches, MD Admitting Physician Shanda Howells, MD   PCP please follow-up on: - Please check CBC, BMP during next visit. - Patient blood pressure was uncontrolled, so she was started on clonidine 0.1 mg oral daily, please follow on, and adjust medication as needed. - Patient was started on aspirin 325 mg oral daily for DVT prophylaxis, please reassess his need and discontinue once patient is fully ambulatory.  Admission Diagnosis  Fall [W19.XXXA] Essential hypertension [I10] Fracture, intertrochanteric, left femur, closed, initial encounter [S72.142A]  Discharge Diagnosis   Principal Problem:   Hip fracture Active Problems:   Essential hypertension   Coronary atherosclerosis   Atrial flutter   Protein-calorie malnutrition, severe   Past Medical History  Diagnosis Date  . Hypertension   . Coronary artery disease   . PVD (peripheral vascular disease)   . Peripheral arterial disease   . Dysrhythmia     ATRIAL FLUTTER  . GERD (gastroesophageal reflux disease)   . Cancer     OVARIAN    1967  . Arthritis   . Atrial flutter     Past Surgical History  Procedure Laterality Date  . Bpg    . Pr vein bypass graft,aorto-fem-pop      Right   . Gliayte catherter insertion  06/27/10  . Carotid stent  11/30/09    LAD S/P Right CA DES  . Abdominal hysterectomy    . Atrial flutter ablation N/A 05/22/2013    Procedure: ATRIAL FLUTTER ABLATION;  Surgeon: Evans Lance, MD;  Location: [facility] CATH LAB;  Service: Cardiovascular;  Laterality: N/A;  . Intramedullary (im) nail intertrochanteric Left 07/28/2014    Procedure: Left Affixus Nail;  Surgeon: Marybelle Killings, MD;  Location: [facility];  Service: Orthopedics;  Laterality: Left;     Hospital Course See H&P, Labs, Consult and Test reports for all details in brief, patient was admitted for **  Principal Problem:   Hip  fracture Active Problems:   Essential hypertension   Coronary atherosclerosis   Atrial flutter   Protein-calorie malnutrition, severe  81 y.o. year old female with significant past medical history of atrial flutter, CAD, HTN presenting with mechanical fall, L hip fracture. Pt was at home when she accidentally fell, landing on her L hip. No CP, SOB, palpitations prior to fall. No head trauma. No LOC. CXR and knee XR WNL. L hip xray Mildly comminuted left femoral intertrochanteric fracture, with mild lateral displacement and rotation of the distal femur. Patient had surgical repair on 07/28/14 by Dr. Lorin Mercy.  Hip fracture - Status post surgical repair by Dr. Lorin Mercy on 2/3.Left Affixus trochanteric nail with proximal distal interlock. - PT/OT at home. - On aspirin 325 mg oral daily for DVT prophylaxis.    Essential hypertension - cont Carvedilol - Uncontrolled during hospital stay, so-called clonidine was added.   Coronary atherosclerosis -with stenting - no c/o chest pain-   Atrial flutter - s/p catheter ablation on 11./14- not on anticoagulation  Leukocytosis - Most likely stress related from surgery, patient is a febrile, negative urinalysis and chest x-ray. - Resolved  Consults  Orthopedic  Significant Tests:  See full reports for all details    Dg Chest 1 View  07/28/2014   CLINICAL DATA:  Status post fall; concern for chest injury. Initial encounter.  EXAM: CHEST - 1 VIEW  COMPARISON:  Chest radiograph performed 05/20/2013  FINDINGS: The lungs  are well-aerated and clear. There is no evidence of focal opacification, pleural effusion or pneumothorax.  The cardiomediastinal silhouette is mildly enlarged. No acute osseous abnormalities are seen.  IMPRESSION: No acute cardiopulmonary process seen; mild cardiomegaly noted. No displaced rib fractures identified.   Electronically Signed   By: Garald Balding M.D.   On: 07/28/2014 00:20   Dg Knee Complete 4 Views Left  07/27/2014    CLINICAL DATA:  Left knee pain after fall.  EXAM: LEFT KNEE - COMPLETE 4+ VIEW  COMPARISON:  None.  FINDINGS: No acute fracture or dislocation. There is osteoarthritis in the lateral tibial femoral compartment with possible remote prior injury to the lateral tibial plateau. The bones are under mineralized. There is no joint effusion or localizing soft tissue abnormality. Vascular calcifications are seen.  IMPRESSION: No fracture or dislocation of the left knee.   Electronically Signed   By: Jeb Levering M.D.   On: 07/27/2014 23:47   Dg C-arm 1-60 Min  07/28/2014   CLINICAL DATA:  81 year old female with left intertrochanteric femoral fracture  EXAM: DG C-ARM 61-120 MIN; LEFT FEMUR 2 VIEWS  COMPARISON:  Preoperative radiographs 07/27/2014  FINDINGS: Interval ORIF of intertrochanteric left femoral fracture with intra medullary nail including 2 distal interlocking femoral artery. Screws and proximal Affixus nail. No evidence of immediate hardware complication. Alignment of the fracture fragments is improved compared to the preoperative radiographs. Atherosclerotic calcifications present in the common femoral artery.  IMPRESSION: 1. ORIF of intertrochanteric left femoral fracture as described above without evidence of hardware complication. 2. Atherosclerotic vascular calcifications.   Electronically Signed   By: Jacqulynn Cadet M.D.   On: 07/28/2014 18:46   Dg Hip Unilat With Pelvis 2-3 Views Left  07/27/2014   CLINICAL DATA:  Status post fall in garage, with acute onset of left hip pain. Initial encounter.  EXAM: LEFT HIP (WITH PELVIS) 2-3 VIEWS  COMPARISON:  None.  FINDINGS: There is a mildly comminuted left femoral intertrochanteric fracture. Mild lateral displacement and rotation of the distal femur is noted. The left femoral head remains seated at the acetabulum. The right hip joint is unremarkable in appearance.  Degenerative change is noted at the lower lumbar spine and at the sacroiliac joints. The  visualized bowel gas pattern is grossly unremarkable, with a stool ball noted at the rectum. Scattered vascular calcifications are seen.  IMPRESSION: 1. Mildly comminuted left femoral intertrochanteric fracture, with mild lateral displacement and rotation of the distal femur. 2. Scattered vascular calcifications seen.   Electronically Signed   By: Garald Balding M.D.   On: 07/27/2014 23:50   Dg Femur Min 2 Views Left  07/28/2014   CLINICAL DATA:  81 year old female with left intertrochanteric femoral fracture  EXAM: DG C-ARM 61-120 MIN; LEFT FEMUR 2 VIEWS  COMPARISON:  Preoperative radiographs 07/27/2014  FINDINGS: Interval ORIF of intertrochanteric left femoral fracture with intra medullary nail including 2 distal interlocking femoral artery. Screws and proximal Affixus nail. No evidence of immediate hardware complication. Alignment of the fracture fragments is improved compared to the preoperative radiographs. Atherosclerotic calcifications present in the common femoral artery.  IMPRESSION: 1. ORIF of intertrochanteric left femoral fracture as described above without evidence of hardware complication. 2. Atherosclerotic vascular calcifications.   Electronically Signed   By: Jacqulynn Cadet M.D.   On: 07/28/2014 18:46     Today   Subjective:   Michelle Gaines today has no headache,no chest abdominal pain,no new weakness tingling or numbness, feels much better . Today  Objective:   Blood pressure 144/85, pulse 83, temperature 98.7 F (37.1 C), temperature source Oral, resp. rate 16, height 5\' 4"  (1.626 m), weight 38.102 kg (84 lb), SpO2 99 %.  Intake/Output Summary (Last 24 hours) at 07/30/14 1308 Last data filed at 07/30/14 1237  Gross per 24 hour  Intake    210 ml  Output      6 ml  Net    204 ml    Exam Awake Alert, Oriented *3, No new F.N deficits, Normal affect [place].AT,PERRAL Supple Neck,No JVD, No cervical lymphadenopathy appriciated.  Symmetrical Chest wall movement, Good air  movement bilaterally, CTAB RRR,No Gallops,Rubs or new Murmurs, No Parasternal Heave +ve B.Sounds, Abd Soft, Non tender, No organomegaly appriciated, No rebound -guarding or rigidity. No Cyanosis, Clubbing or edema, No new Rash or bruise  Data Review   Cultures -   CBC w Diff: Lab Results  Component Value Date   WBC 9.6 07/30/2014   HGB 8.7* 07/30/2014   HCT 26.7* 07/30/2014   PLT 106* 07/30/2014   LYMPHOPCT 10* 07/28/2014   MONOPCT 4 07/28/2014   EOSPCT 0 07/28/2014   BASOPCT 0 07/28/2014   CMP: Lab Results  Component Value Date   NA 139 07/30/2014   K 3.6 07/30/2014   CL 108 07/30/2014   CO2 25 07/30/2014   BUN 19 07/30/2014   CREATININE 0.77 07/30/2014   PROT 5.6* 07/30/2014   ALBUMIN 3.2* 07/30/2014   BILITOT 0.8 07/30/2014   ALKPHOS 52 07/30/2014   AST 27 07/30/2014   ALT 18 07/30/2014  .  Micro Results No results found for this or any previous visit (from the past 240 hour(s)).   Discharge Instructions      Follow-up Information    Follow up with [facility].   Why:  They will contact you to schedule home therapy visits.   Contact information:   [address] [place] [address] [phone]       Follow up with Marybelle Killings, MD In 2 weeks.   Specialty:  Orthopedic Surgery   Contact information:   [place] [address] [phone]       Follow up with [facility], [name], MD. Schedule an appointment as soon as possible for a visit in 1 week.   Specialty:  Internal Medicine   Why:  POST HOSPITALIZATION VISIT   Contact information:   [address], [address] [place] [address] [phone]       Discharge Medications     Medication List    TAKE these medications        aspirin 325 MG tablet  Commonly known as:  BAYER ASPIRIN  Take 1 tablet (325 mg total) by mouth daily.     carvedilol 3.125 MG tablet  Commonly known as:  COREG  Take 1 tablet (3.125 mg total) by mouth 2 (two)  times daily with a meal. Okay to take one additional tablet daily as needed SBP > 150.     cloNIDine 0.1 MG tablet  Commonly known as:  CATAPRES  Take 1 tablet (0.1 mg total) by mouth daily.     cyanocobalamin 100 MCG tablet  Take 100 mcg by mouth daily.     docusate sodium 100 MG capsule  Commonly known as:  COLACE  Take 1 capsule (100 mg total) by mouth daily.     FLAX SEED OIL PO  Take 1 tablet by mouth daily.     HYDROcodone-acetaminophen 5-325  MG per tablet  Commonly known as:  NORCO/VICODIN  Take 1-2 tablets by mouth every 6 (six) hours as needed for moderate pain.     L-Carnitine 250 MG Tabs  Take 2 tablets by mouth daily.     LORazepam 0.5 MG tablet  Commonly known as:  ATIVAN  Take 0.5 mg by mouth every 8 (eight) hours as needed for anxiety.     Lutein 20 MG Tabs  Take 20 mg by mouth daily.     magnesium gluconate 500 MG tablet  Commonly known as:  MAGONATE  Take 500 mg by mouth 2 (two) times daily.     multivitamin per tablet  Take 1 tablet by mouth daily.     nitroGLYCERIN 0.4 MG SL tablet  Commonly known as:  NITROSTAT  Place 1 tablet (0.4 mg total) under the tongue every 5 (five) minutes as needed for chest pain.     Potassium 99 MG Tabs  Take 1 tablet by mouth daily.     vitamin C 1000 MG tablet  Take 1,000 mg by mouth daily.     VITAMIN D (CHOLECALCIFEROL) PO  Take 1,000 mg by mouth 2 (two) times daily.     vitamin E 400 UNIT capsule  Take 400 Units by mouth daily.         Total Time in preparing paper work, data evaluation and todays exam - 35 minutes  Keita Demarco M.D on 07/30/2014 at [facility]  [phone]

## 2014-07-30 NOTE — Discharge Instructions (Signed)
Hip Rehabilitation, Guidelines Following Surgery  The results of a hip operation are greatly improved after range of motion and muscle strengthening exercises. Follow all safety measures which are given to protect your hip. If any of these exercises cause increased pain or swelling in your joint, decrease the amount until you are comfortable again. Then slowly increase the exercises. Call your caregiver if you have problems or questions.  HOME CARE INSTRUCTIONS  Most of the following instructions are designed to prevent the dislocation of your new hip.  Remove items at home which could result in a fall. This includes throw rugs or furniture in walking pathways.  Continue medications as instructed at time of discharge.  You may have some home medications which will be placed on hold until you complete the course of blood thinner medication.  You may start showering once you are discharged home. Do not remove your dressing. Do not put on socks or shoes without following the instructions of your caregivers.   Sit on chairs with arms. Use the chair arms to help push yourself up when arising.  Arrange for the use of a toilet seat elevator so you are not sitting low.   Walk with walker as instructed.  You may resume a sexual relationship in one month or when given the OK by your caregiver.  Use walker as long as suggested by your caregivers.  You may put full weight on your legs and walk as much as is comfortable. Avoid periods of inactivity such as sitting longer than an hour when not asleep. This helps prevent blood clots.  You may return to work once you are cleared by [employer], [employer].  Do not drive a car for 6 weeks or until released by your surgeon.  Do not drive while taking narcotics.  Wear elastic stockings for two weeks following surgery during the day but you may remove then at night.  Make sure you keep all of your appointments after your operation with all of your doctors and caregivers.  You should call the office at the above phone number and make an appointment for approximately two weeks after the date of your surgery. Please pick up a stool softener and laxative for home use as long as you are requiring pain medications.  ICE to the affected hip every three hours for 30 minutes at a time and then as needed for pain and swelling. Continue to use ice on the hip for pain and swelling from surgery. You may notice swelling that will progress down to the foot and ankle.  This is normal after surgery.  Elevate the leg when you are not up walking on it.   It is important for you to complete the blood thinner medication as prescribed by your doctor.  Continue to use the breathing machine which will help keep your temperature down.  It is common for your temperature to cycle up and down following surgery, especially at night when you are not up moving around and exerting yourself.  The breathing machine keeps your lungs expanded and your temperature down.  RANGE OF MOTION AND STRENGTHENING EXERCISES  These exercises are designed to help you keep full movement of your hip joint. Follow your caregiver's or physical therapist's instructions. Perform all exercises about fifteen times, three times per day or as directed. Exercise both hips, even if you have had only one joint replacement. These exercises can be done on a training (exercise) mat, on the floor, on a table or on  a bed. Use whatever works the best and is most comfortable for you. Use music or television while you are exercising so that the exercises are a pleasant break in your day. This will make your life better with the exercises acting as a break in routine you can look forward to.  Lying on your back, slowly slide your foot toward your buttocks, raising your knee up off the floor. Then slowly slide your foot back down until your leg is straight again.  Lying on your back spread your legs as far apart as you can without causing  discomfort.  Lying on your side, raise your upper leg and foot straight up from the floor as far as is comfortable. Slowly lower the leg and repeat.  Lying on your back, tighten up the muscle in the front of your thigh (quadriceps muscles). You can do this by keeping your leg straight and trying to raise your heel off the floor. This helps strengthen the largest muscle supporting your knee.  Lying on your back, tighten up the muscles of your buttocks both with the legs straight and with the knee bent at a comfortable angle while keeping your heel on the floor.   SKILLED REHAB INSTRUCTIONS: If the patient is transferred to a skilled rehab facility following release from the hospital, a list of the current medications will be sent to the facility for the patient to continue.  When discharged from the skilled rehab facility, please have the facility set up the patient's [place] prior to being released. Also, the skilled facility will be responsible for providing the patient with their medications at time of release from the facility to include their pain medication and their blood thinner medication. If the patient is still at the rehab facility at time of the two week follow up appointment, the skilled rehab facility will also need to assist the patient in arranging follow up appointment in our office and any transportation needs.  MAKE SURE YOU:  Understand these instructions.  Will watch your condition.  Will get help right away if you are not doing well or get worse.  Pick up stool softner and laxative for home use following surgery while on pain medications. Do not remove your dressing. The dressing is waterproof--it is OK to take showers. Continue to use ice for pain and swelling after surgery. Do not use any lotions or creams on the incision until instructed by your surgeon. Total Hip Protocol. Hip Rehabilitation, Guidelines Following Surgery  The results of a hip  operation are greatly improved after range of motion and muscle strengthening exercises. Follow all safety measures which are given to protect your hip. If any of these exercises cause increased pain or swelling in your joint, decrease the amount until you are comfortable again. Then slowly increase the exercises. Call your caregiver if you have problems or questions.  HOME CARE INSTRUCTIONS  Most of the following instructions are designed to prevent the dislocation of your new hip.  Remove items at home which could result in a fall. This includes throw rugs or furniture in walking pathways.  Continue medications as instructed at time of discharge.  You may have some home medications which will be placed on hold until you complete the course of blood thinner medication.  You may start showering once you are discharged home. Do not remove your dressing. Do not put on socks or shoes without following the instructions of your caregivers.   Sit on chairs  with arms. Use the chair arms to help push yourself up when arising.  Arrange for the use of a toilet seat elevator so you are not sitting low.   Walk with walker as instructed.  You may resume a sexual relationship in one month or when given the OK by your caregiver.  Use walker as long as suggested by your caregivers.  You may put full weight on your legs and walk as much as is comfortable. Avoid periods of inactivity such as sitting longer than an hour when not asleep. This helps prevent blood clots.  You may return to work once you are cleared by [employer], production.  Do not drive a car for 6 weeks or until released by your surgeon.  Do not drive while taking narcotics.  Wear elastic stockings for two weeks following surgery during the day but you may remove then at night.  Make sure you keep all of your appointments after your operation with all of your doctors and caregivers. You should call the office at the above phone number and make an  appointment for approximately two weeks after the date of your surgery. Please pick up a stool softener and laxative for home use as long as you are requiring pain medications.  ICE to the affected hip every three hours for 30 minutes at a time and then as needed for pain and swelling. Continue to use ice on the hip for pain and swelling from surgery. You may notice swelling that will progress down to the foot and ankle.  This is normal after surgery.  Elevate the leg when you are not up walking on it.   It is important for you to complete the blood thinner medication as prescribed by your doctor.  Continue to use the breathing machine which will help keep your temperature down.  It is common for your temperature to cycle up and down following surgery, especially at night when you are not up moving around and exerting yourself.  The breathing machine keeps your lungs expanded and your temperature down.  RANGE OF MOTION AND STRENGTHENING EXERCISES  These exercises are designed to help you keep full movement of your hip joint. Follow your caregiver's or physical therapist's instructions. Perform all exercises about fifteen times, three times per day or as directed. Exercise both hips, even if you have had only one joint replacement. These exercises can be done on a training (exercise) mat, on the floor, on a table or on a bed. Use whatever works the best and is most comfortable for you. Use music or television while you are exercising so that the exercises are a pleasant break in your day. This will make your life better with the exercises acting as a break in routine you can look forward to.  Lying on your back, slowly slide your foot toward your buttocks, raising your knee up off the floor. Then slowly slide your foot back down until your leg is straight again.  Lying on your back spread your legs as far apart as you can without causing discomfort.  Lying on your side, raise your upper leg and foot  straight up from the floor as far as is comfortable. Slowly lower the leg and repeat.  Lying on your back, tighten up the muscle in the front of your thigh (quadriceps muscles). You can do this by keeping your leg straight and trying to raise your heel off the floor. This helps strengthen the largest muscle supporting your knee.  Lying on your back, tighten up the muscles of your buttocks both with the legs straight and with the knee bent at a comfortable angle while keeping your heel on the floor.   SKILLED REHAB INSTRUCTIONS: If the patient is transferred to a skilled rehab facility following release from the hospital, a list of the current medications will be sent to the facility for the patient to continue.  When discharged from the skilled rehab facility, please have the facility set up the patient's [place] prior to being released. Also, the skilled facility will be responsible for providing the patient with their medications at time of release from the facility to include their pain medication and their blood thinner medication. If the patient is still at the rehab facility at time of the two week follow up appointment, the skilled rehab facility will also need to assist the patient in arranging follow up appointment in our office and any transportation needs.  MAKE SURE YOU:  Understand these instructions.  Will watch your condition.  Will get help right away if you are not doing well or get worse.  Pick up stool softner and laxative for home use following surgery while on pain medications. Do not remove your dressing. The dressing is waterproof--it is OK to take showers. Continue to use ice for pain and swelling after surgery. Do not use any lotions or creams on the incision until instructed by your surgeon. Weightbearing as tolerated

## 2014-07-30 NOTE — Progress Notes (Signed)
Physical Therapy Treatment Patient Details Name: Michelle Gaines MRN: [id number] DOB: 09-13-1928 Today's Date: 07/30/2014    History of Present Illness Pt. is   an 81 yo lady who fell sustaining a displaced L intertrochanteric hip fx, now s/p L Affixus nail.      PT Comments    Education has been completed with pt. And her husband.  She is generally at a supervision level of assist and husband has demonstrated ability to provide supervision with mobility.  She is ready for DC home with 24 hour care from PT standpoint.  Will need ongoing HHPT to reach full independence.    Follow Up Recommendations  Home health PT;Supervision/Assistance - 24 hour;Supervision for mobility/OOB     Equipment Recommendations  3in1 (PT) (per CM note, pt. has declined 3n1)    Recommendations for Other Services       Precautions / Restrictions Precautions Precautions: Fall Restrictions Weight Bearing Restrictions: Yes LLE Weight Bearing: Weight bearing as tolerated    Mobility  Bed Mobility Overal bed mobility:  (not tested; pt. in recliner, dressed for DC)                Transfers Overall transfer level: Needs assistance Equipment used: Rolling walker (2 wheeled) Transfers: Sit to/from Stand Sit to Stand: Supervision         General transfer comment: good technique and hand placement; husband educated on how to guard pt. for safety and he demonstrated correct strategies  Ambulation/Gait Ambulation/Gait assistance: Supervision Ambulation Distance (Feet): 100 Feet (50' x 2 with seated rest) Assistive device: Rolling walker (2 wheeled) Gait Pattern/deviations: Step-to pattern;Decreased stride length;Trunk flexed Gait velocity: decreased   General Gait Details: As in am, pt. tolerated 50' , took brief sseated rest then returned to room.  Pt. educated on guarding during ambulation.  He tended to want to walk behind pt., however I recommended he walk to the side of pt. for best stability  for pt and himself   Stairs Stairs: Yes Stairs assistance: Min guard Stair Management: No rails;Backwards Number of Stairs: 1 General stair comments: demonstrated technique for ascending one step with min guard assist to pt. and husband as well as son in law.  Pt. declined coming to gym for practice as pt. and husband anxious for DC.  Wheelchair Mobility    Modified Rankin (Stroke Patients Only)       Balance Overall balance assessment: Needs assistance Sitting-balance support: No upper extremity supported;Feet supported Sitting balance-Leahy Scale: Good     Standing balance support: Bilateral upper extremity supported;During functional activity Standing balance-Leahy Scale: Fair                      Cognition Arousal/Alertness: Awake/alert Behavior During Therapy: WFL for tasks assessed/performed Overall Cognitive Status: Within Functional Limits for tasks assessed                      Exercises      General Comments        Pertinent Vitals/Pain Pain Assessment: 0-10 Pain Score: 3  Pain Location: lewft LE Pain Descriptors / Indicators: Sore Pain Intervention(s): Monitored during session;Limited activity within patient's tolerance;Repositioned    Home Living Family/patient expects to be discharged to:: Private residence Living Arrangements: Spouse/significant other Available Help at Discharge: Available 24 hours/day;Family (husband) Type of Home: House Home Access: Stairs to enter Entrance Stairs-Rails: None Home Layout: One level Home Equipment: Environmental consultant - 2 wheels;Cane - quad;Shower seat  Prior Function Level of Independence: Independent          PT Goals (current goals can now be found in the care plan section) Acute Rehab PT Goals Patient Stated Goal: go home! Progress towards PT goals: Progressing toward goals (needs ongoing HHPT for maximum independence)    Frequency  Min 5X/week    PT Plan Current plan remains  appropriate    Co-evaluation             End of Session Equipment Utilized During Treatment: Gait belt Activity Tolerance: Patient tolerated treatment well Patient left: in chair;with call bell/phone within reach;with family/visitor present     Time: 1352-1415 PT Time Calculation (min) (ACUTE ONLY): 23 min  Charges:  $Gait Training: 8-22 mins $Self Care/Home Management: 8-22                    G Codes:      [name] 07/30/2014, 2:32 PM [name] PT Acute Rehab Services [phone] [place] [phone]

## 2014-07-31 DIAGNOSIS — S72002D Fracture of unspecified part of neck of left femur, subsequent encounter for closed fracture with routine healing: Secondary | ICD-10-CM | POA: Diagnosis not present

## 2014-07-31 DIAGNOSIS — I739 Peripheral vascular disease, unspecified: Secondary | ICD-10-CM | POA: Diagnosis not present

## 2014-07-31 DIAGNOSIS — K219 Gastro-esophageal reflux disease without esophagitis: Secondary | ICD-10-CM | POA: Diagnosis not present

## 2014-07-31 DIAGNOSIS — I2510 Atherosclerotic heart disease of native coronary artery without angina pectoris: Secondary | ICD-10-CM | POA: Diagnosis not present

## 2014-07-31 DIAGNOSIS — Z8543 Personal history of malignant neoplasm of ovary: Secondary | ICD-10-CM | POA: Diagnosis not present

## 2014-07-31 DIAGNOSIS — I10 Essential (primary) hypertension: Secondary | ICD-10-CM | POA: Diagnosis not present

## 2014-08-03 DIAGNOSIS — I2510 Atherosclerotic heart disease of native coronary artery without angina pectoris: Secondary | ICD-10-CM | POA: Diagnosis not present

## 2014-08-03 DIAGNOSIS — I739 Peripheral vascular disease, unspecified: Secondary | ICD-10-CM | POA: Diagnosis not present

## 2014-08-03 DIAGNOSIS — Z8543 Personal history of malignant neoplasm of ovary: Secondary | ICD-10-CM | POA: Diagnosis not present

## 2014-08-03 DIAGNOSIS — S72002D Fracture of unspecified part of neck of left femur, subsequent encounter for closed fracture with routine healing: Secondary | ICD-10-CM | POA: Diagnosis not present

## 2014-08-03 DIAGNOSIS — K219 Gastro-esophageal reflux disease without esophagitis: Secondary | ICD-10-CM | POA: Diagnosis not present

## 2014-08-03 DIAGNOSIS — I10 Essential (primary) hypertension: Secondary | ICD-10-CM | POA: Diagnosis not present

## 2014-08-07 DIAGNOSIS — I10 Essential (primary) hypertension: Secondary | ICD-10-CM | POA: Diagnosis not present

## 2014-08-07 DIAGNOSIS — S72002D Fracture of unspecified part of neck of left femur, subsequent encounter for closed fracture with routine healing: Secondary | ICD-10-CM | POA: Diagnosis not present

## 2014-08-07 DIAGNOSIS — K219 Gastro-esophageal reflux disease without esophagitis: Secondary | ICD-10-CM | POA: Diagnosis not present

## 2014-08-07 DIAGNOSIS — Z8543 Personal history of malignant neoplasm of ovary: Secondary | ICD-10-CM | POA: Diagnosis not present

## 2014-08-07 DIAGNOSIS — I739 Peripheral vascular disease, unspecified: Secondary | ICD-10-CM | POA: Diagnosis not present

## 2014-08-07 DIAGNOSIS — I2510 Atherosclerotic heart disease of native coronary artery without angina pectoris: Secondary | ICD-10-CM | POA: Diagnosis not present

## 2014-08-10 DIAGNOSIS — Z8543 Personal history of malignant neoplasm of ovary: Secondary | ICD-10-CM | POA: Diagnosis not present

## 2014-08-10 DIAGNOSIS — I739 Peripheral vascular disease, unspecified: Secondary | ICD-10-CM | POA: Diagnosis not present

## 2014-08-10 DIAGNOSIS — I2510 Atherosclerotic heart disease of native coronary artery without angina pectoris: Secondary | ICD-10-CM | POA: Diagnosis not present

## 2014-08-10 DIAGNOSIS — S72002D Fracture of unspecified part of neck of left femur, subsequent encounter for closed fracture with routine healing: Secondary | ICD-10-CM | POA: Diagnosis not present

## 2014-08-10 DIAGNOSIS — I10 Essential (primary) hypertension: Secondary | ICD-10-CM | POA: Diagnosis not present

## 2014-08-10 DIAGNOSIS — K219 Gastro-esophageal reflux disease without esophagitis: Secondary | ICD-10-CM | POA: Diagnosis not present

## 2014-08-12 DIAGNOSIS — I2510 Atherosclerotic heart disease of native coronary artery without angina pectoris: Secondary | ICD-10-CM | POA: Diagnosis not present

## 2014-08-12 DIAGNOSIS — I10 Essential (primary) hypertension: Secondary | ICD-10-CM | POA: Diagnosis not present

## 2014-08-12 DIAGNOSIS — I739 Peripheral vascular disease, unspecified: Secondary | ICD-10-CM | POA: Diagnosis not present

## 2014-08-12 DIAGNOSIS — K219 Gastro-esophageal reflux disease without esophagitis: Secondary | ICD-10-CM | POA: Diagnosis not present

## 2014-08-12 DIAGNOSIS — Z8543 Personal history of malignant neoplasm of ovary: Secondary | ICD-10-CM | POA: Diagnosis not present

## 2014-08-12 DIAGNOSIS — S72002D Fracture of unspecified part of neck of left femur, subsequent encounter for closed fracture with routine healing: Secondary | ICD-10-CM | POA: Diagnosis not present

## 2014-08-16 DIAGNOSIS — S72142D Displaced intertrochanteric fracture of left femur, subsequent encounter for closed fracture with routine healing: Secondary | ICD-10-CM | POA: Diagnosis not present

## 2014-08-17 DIAGNOSIS — I739 Peripheral vascular disease, unspecified: Secondary | ICD-10-CM | POA: Diagnosis not present

## 2014-08-17 DIAGNOSIS — I10 Essential (primary) hypertension: Secondary | ICD-10-CM | POA: Diagnosis not present

## 2014-08-17 DIAGNOSIS — Z8543 Personal history of malignant neoplasm of ovary: Secondary | ICD-10-CM | POA: Diagnosis not present

## 2014-08-17 DIAGNOSIS — K219 Gastro-esophageal reflux disease without esophagitis: Secondary | ICD-10-CM | POA: Diagnosis not present

## 2014-08-17 DIAGNOSIS — I2510 Atherosclerotic heart disease of native coronary artery without angina pectoris: Secondary | ICD-10-CM | POA: Diagnosis not present

## 2014-08-17 DIAGNOSIS — S72002D Fracture of unspecified part of neck of left femur, subsequent encounter for closed fracture with routine healing: Secondary | ICD-10-CM | POA: Diagnosis not present

## 2014-08-19 DIAGNOSIS — I2510 Atherosclerotic heart disease of native coronary artery without angina pectoris: Secondary | ICD-10-CM | POA: Diagnosis not present

## 2014-08-19 DIAGNOSIS — I739 Peripheral vascular disease, unspecified: Secondary | ICD-10-CM | POA: Diagnosis not present

## 2014-08-19 DIAGNOSIS — K219 Gastro-esophageal reflux disease without esophagitis: Secondary | ICD-10-CM | POA: Diagnosis not present

## 2014-08-19 DIAGNOSIS — Z8543 Personal history of malignant neoplasm of ovary: Secondary | ICD-10-CM | POA: Diagnosis not present

## 2014-08-19 DIAGNOSIS — I10 Essential (primary) hypertension: Secondary | ICD-10-CM | POA: Diagnosis not present

## 2014-08-19 DIAGNOSIS — S72002D Fracture of unspecified part of neck of left femur, subsequent encounter for closed fracture with routine healing: Secondary | ICD-10-CM | POA: Diagnosis not present

## 2014-08-25 ENCOUNTER — Encounter: Payer: Self-pay | Admitting: Internal Medicine

## 2014-08-25 ENCOUNTER — Ambulatory Visit (INDEPENDENT_AMBULATORY_CARE_PROVIDER_SITE_OTHER): Payer: Medicare Other | Admitting: Internal Medicine

## 2014-08-25 VITALS — BP 142/70 | HR 65 | Ht 64.0 in | Wt 83.0 lb

## 2014-08-25 DIAGNOSIS — I4892 Unspecified atrial flutter: Secondary | ICD-10-CM | POA: Diagnosis not present

## 2014-08-25 DIAGNOSIS — S72002S Fracture of unspecified part of neck of left femur, sequela: Secondary | ICD-10-CM | POA: Diagnosis not present

## 2014-08-25 MED ORDER — CARVEDILOL 3.125 MG PO TABS: 3.1250 mg | ORAL_TABLET | Freq: Two times a day (BID) | ORAL | Status: DC

## 2014-08-25 NOTE — Assessment & Plan Note (Signed)
Her blood pressure is controlled. She will continue her current meds.  

## 2014-08-25 NOTE — Progress Notes (Signed)
HPI [name] returns today for followup. She is a very pleasant 81 yo woman with a h/o atrial flutter s/p catheter ablation.  She notes that her constipation has been her biggest problem and she has her blood pressure increase when she is constipated. She broke her leg a month ago. She has recovered nicely.  Allergies  Allergen Reactions  . Fexofenadine Shortness Of Breath  . Cephalosporins Rash  . Amlodipine   . Anaprox [Naproxen Sodium]   . Benzonatate   . Cephalexin   . Clindamycin/Lincomycin   . Clorazepate   . Codeine   . Diltiazem     Dizziness   . Doxycycline   . Hydralazine   . Labetalol   . Lisinopril   . Phenylephrine   . Phenytoin   . Zolpidem   . Penicillins Nausea And Vomiting, Swelling and Rash     Current Outpatient Prescriptions  Medication Sig Dispense Refill  . Ascorbic Acid (VITAMIN C) 1000 MG tablet Take 1,000 mg by mouth daily.      Marland Kitchen aspirin (BAYER ASPIRIN) 325 MG tablet Take 1 tablet (325 mg total) by mouth daily. 28 tablet 0  . carvedilol (COREG) 3.125 MG tablet Take 1 tablet (3.125 mg total) by mouth 2 (two) times daily with a meal. Okay to take one additional tablet daily as needed SBP > 150. 70 tablet 11  . cloNIDine (CATAPRES) 0.1 MG tablet Take 1 tablet (0.1 mg total) by mouth daily. 30 tablet 0  . cyanocobalamin 100 MCG tablet Take 100 mcg by mouth daily.    Marland Kitchen docusate sodium (COLACE) 100 MG capsule Take 1 capsule (100 mg total) by mouth daily. 30 capsule 0  . Flaxseed, Linseed, (FLAX SEED OIL PO) Take 1 tablet by mouth daily.    Marland Kitchen HYDROcodone-acetaminophen (NORCO/VICODIN) 5-325 MG per tablet Take 1-2 tablets by mouth every 6 (six) hours as needed for moderate pain. 50 tablet 0  . LevOCARNitine (L-CARNITINE) 250 MG TABS Take 2 tablets by mouth daily.      Marland Kitchen LORazepam (ATIVAN) 0.5 MG tablet Take 0.5 mg by mouth every 8 (eight) hours as needed for anxiety.    . Lutein 20 MG TABS Take 20 mg by mouth daily.    . magnesium gluconate  (MAGONATE) 500 MG tablet Take 500 mg by mouth 2 (two) times daily.      . multivitamin (THERAGRAN) per tablet Take 1 tablet by mouth daily.      . nitroGLYCERIN (NITROSTAT) 0.4 MG SL tablet Place 1 tablet (0.4 mg total) under the tongue every 5 (five) minutes as needed for chest pain. 25 tablet 12  . Potassium 99 MG TABS Take 1 tablet by mouth daily.      Marland Kitchen VITAMIN D, CHOLECALCIFEROL, PO Take 1,000 mg by mouth 2 (two) times daily.    . vitamin E 400 UNIT capsule Take 400 Units by mouth daily.     No current facility-administered medications for this visit.     Past Medical History  Diagnosis Date  . Hypertension   . Coronary artery disease   . PVD (peripheral vascular disease)   . Peripheral arterial disease   . Dysrhythmia     ATRIAL FLUTTER  . GERD (gastroesophageal reflux disease)   . Cancer     OVARIAN    1967  . Arthritis   . Atrial flutter     ROS:   All systems reviewed and negative except as noted in the HPI.   Past  Surgical History  Procedure Laterality Date  . Bpg    . Pr vein bypass graft,aorto-fem-pop      Right   . Gliayte catherter insertion  06/27/10  . Carotid stent  11/30/09    LAD S/P Right CA DES  . Abdominal hysterectomy    . Atrial flutter ablation N/A 05/22/2013    Procedure: ATRIAL FLUTTER ABLATION;  Surgeon: Evans Lance, MD;  Location: [facility] C[facility] LAB;  Service: Cardiovascular;  Laterality: N/A;  . Intramedullary (im) nail intertrochanteric Left 07/28/2014    Procedure: Left Affixus Nail;  Surgeon: Marybelle Killings, MD;  Location: [place];  Service: Orthopedics;  Laterality: Left;     Family History  Problem Relation Age of Onset  . COPD Father   . Hypertension Father      History   Social History  . Marital Status: Married    Spouse Name: N/A  . Number of Children: N/A  . Years of Education: N/A   Occupational History  . Not on file.   Social History Main Topics  . Smoking status: Never Smoker   . Smokeless tobacco: Never Used  . Alcohol  Use: No  . Drug Use: No  . Sexual Activity: Not on file   Other Topics Concern  . Not on file   Social History Narrative     There were no vitals taken for this visit.  Physical Exam:  Well appearing 81 yo woman, NAD HEENT: Unremarkable Neck:  6 cm JVD, no thyromegall Back:  No CVA tenderness Lungs:  Clear with no wheezes HEART:  Regular rate rhythm, no murmurs, no rubs, no clicks Abd:  soft, positive bowel sounds, no organomegally, no rebound, no guarding Ext:  2 plus pulses, no edema, no cyanosis, no clubbing Skin:  No rashes no nodules Neuro:  CN II through XII intact, motor grossly intact   Assess/Plan:

## 2014-08-25 NOTE — Patient Instructions (Signed)
Your physician recommends that you continue on your current medications as directed. Please refer to the Current Medication list given to you today.  Your physician wants you to follow-up in: 1 year follow up with Dr. Lovena Le. You will receive a reminder letter in the mail two months in advance. If you don't receive a letter, please call our office to schedule the follow-up appointment.

## 2014-08-25 NOTE — Assessment & Plan Note (Signed)
She has recovered nicely. She is done with rehab. She is encouraged to walk as she is able.

## 2014-08-25 NOTE — Assessment & Plan Note (Signed)
She has had no recurrence since her ablation. Will follow.

## 2014-09-27 DIAGNOSIS — M1990 Unspecified osteoarthritis, unspecified site: Secondary | ICD-10-CM | POA: Diagnosis not present

## 2014-09-27 DIAGNOSIS — I10 Essential (primary) hypertension: Secondary | ICD-10-CM | POA: Diagnosis not present

## 2014-09-27 DIAGNOSIS — I2510 Atherosclerotic heart disease of native coronary artery without angina pectoris: Secondary | ICD-10-CM | POA: Diagnosis not present

## 2014-10-13 DIAGNOSIS — S72142D Displaced intertrochanteric fracture of left femur, subsequent encounter for closed fracture with routine healing: Secondary | ICD-10-CM | POA: Diagnosis not present

## 2014-11-04 DIAGNOSIS — I2510 Atherosclerotic heart disease of native coronary artery without angina pectoris: Secondary | ICD-10-CM | POA: Diagnosis not present

## 2014-11-04 DIAGNOSIS — I10 Essential (primary) hypertension: Secondary | ICD-10-CM | POA: Diagnosis not present

## 2014-11-09 DIAGNOSIS — S72142D Displaced intertrochanteric fracture of left femur, subsequent encounter for closed fracture with routine healing: Secondary | ICD-10-CM | POA: Diagnosis not present

## 2014-11-16 DIAGNOSIS — J310 Chronic rhinitis: Secondary | ICD-10-CM | POA: Diagnosis not present

## 2014-11-16 DIAGNOSIS — H68121 Intrinsic cartilagenous obstruction of Eustachian tube, right ear: Secondary | ICD-10-CM | POA: Diagnosis not present

## 2014-12-14 DIAGNOSIS — H68121 Intrinsic cartilagenous obstruction of Eustachian tube, right ear: Secondary | ICD-10-CM | POA: Diagnosis not present

## 2014-12-22 DIAGNOSIS — H903 Sensorineural hearing loss, bilateral: Secondary | ICD-10-CM | POA: Diagnosis not present

## 2015-01-07 ENCOUNTER — Encounter: Payer: Self-pay | Admitting: Family

## 2015-01-11 ENCOUNTER — Ambulatory Visit (INDEPENDENT_AMBULATORY_CARE_PROVIDER_SITE_OTHER)
Admission: RE | Admit: 2015-01-11 | Discharge: 2015-01-11 | Disposition: A | Discharge status: Home / Self Care | Payer: Medicare Other | Source: Ambulatory Visit | Attending: Vascular Surgery | Admitting: Vascular Surgery

## 2015-01-11 ENCOUNTER — Other Ambulatory Visit: Payer: Self-pay | Admitting: Vascular Surgery

## 2015-01-11 ENCOUNTER — Encounter: Payer: Self-pay | Admitting: Family

## 2015-01-11 ENCOUNTER — Ambulatory Visit (HOSPITAL_COMMUNITY)
Admission: RE | Admit: 2015-01-11 | Discharge: 2015-01-11 | Disposition: A | Discharge status: Home / Self Care | Payer: Medicare Other | Source: Ambulatory Visit | Attending: Vascular Surgery | Admitting: Vascular Surgery

## 2015-01-11 ENCOUNTER — Ambulatory Visit (INDEPENDENT_AMBULATORY_CARE_PROVIDER_SITE_OTHER): Payer: Medicare Other | Admitting: Family

## 2015-01-11 VITALS — BP 176/94 | HR 62 | Temp 97.4°F | Resp 16 | Ht 62.5 in | Wt 83.2 lb

## 2015-01-11 DIAGNOSIS — I7092 Chronic total occlusion of artery of the extremities: Secondary | ICD-10-CM | POA: Diagnosis not present

## 2015-01-11 DIAGNOSIS — Z4889 Encounter for other specified surgical aftercare: Secondary | ICD-10-CM

## 2015-01-11 DIAGNOSIS — Z48812 Encounter for surgical aftercare following surgery on the circulatory system: Secondary | ICD-10-CM | POA: Diagnosis not present

## 2015-01-11 DIAGNOSIS — I779 Disorder of arteries and arterioles, unspecified: Secondary | ICD-10-CM | POA: Diagnosis not present

## 2015-01-11 DIAGNOSIS — Z95828 Presence of other vascular implants and grafts: Secondary | ICD-10-CM | POA: Diagnosis not present

## 2015-01-11 DIAGNOSIS — Z9889 Other specified postprocedural states: Secondary | ICD-10-CM

## 2015-01-11 NOTE — Progress Notes (Signed)
[facility] [place] HISTORY AND PHYSICAL -PAD  History of Present Illness Michelle Gaines is a 81 y.o. female patient of Dr. Kellie Simmering returns for continuing followup regarding her right external iliac to profunda femoris bypass performed in 2009 for  ischemia of the right leg. She continues to have no specific symptoms of rest pain infection or nonhealing ulcers.  She has left thigh pain with walking since her left femur fracture and ORIF in February 2016.  She denies claudication type pain with walking.  The patient reports New Medical or Surgical History: fell and fractured left femur in several places, had ORIF in February 2016. She is performing daily seated leg exercises as instructed by physical therapy which she feels helps her legs.  Pt states she has always been thin, her appetite is good. Pt denies any history of stroke or TIA.   Pt Diabetic: Yes Pt smoker: non-smoker  Pt meds include: Statin :No, states she does not want to take Betablocker: Yes ASA: No Other anticoagulants/antiplatelets: no  Past Medical History  Diagnosis Date  . Hypertension   . Coronary artery disease   . PVD (peripheral vascular disease)   . Peripheral arterial disease   . Dysrhythmia     ATRIAL FLUTTER  . GERD (gastroesophageal reflux disease)   . Cancer     OVARIAN    1967  . Arthritis   . Atrial flutter     Social History History  Substance Use Topics  . Smoking status: Never Smoker   . Smokeless tobacco: Never Used  . Alcohol Use: No    Family History Family History  Problem Relation Age of Onset  . COPD Father   . Hypertension Father     Past Surgical History  Procedure Laterality Date  . Bpg    . Pr vein bypass graft,aorto-fem-pop      Right   . Gliayte catherter insertion  06/27/10  . Carotid stent  11/30/09    LAD S/P Right CA DES  . Abdominal hysterectomy    . Atrial flutter ablation N/A 05/22/2013    Procedure: ATRIAL FLUTTER ABLATION;   Surgeon: Evans Lance, MD;  Location: [facility] CATH LAB;  Service: Cardiovascular;  Laterality: N/A;  . Intramedullary (im) nail intertrochanteric Left 07/28/2014    Procedure: Left Affixus Nail;  Surgeon: Marybelle Killings, MD;  Location: [place];  Service: Orthopedics;  Laterality: Left;  . Fracture surgery Left Feb. 2, 2016    Left Hip Fx     Allergies  Allergen Reactions  . Fexofenadine Shortness Of Breath  . Cephalosporins Rash  . Amlodipine   . Anaprox [Naproxen Sodium]   . Benzonatate   . Cephalexin   . Clindamycin/Lincomycin   . Clorazepate   . Codeine   . Diltiazem     Dizziness   . Doxycycline   . Hydralazine   . Labetalol   . Lisinopril   . Phenylephrine   . Phenytoin   . Zolpidem   . Penicillins Nausea And Vomiting, Swelling and Rash    Current Outpatient Prescriptions  Medication Sig Dispense Refill  . Ascorbic Acid (VITAMIN C) 1000 MG tablet Take 1,000 mg by mouth daily.      . carvedilol (COREG) 3.125 MG tablet Take 1 tablet (3.125 mg total) by mouth 2 (two) times daily with a meal. Okay to take one additional tablet daily as needed SBP > 150. 180 tablet 3  . cyanocobalamin 100 MCG tablet Take 100 mcg by mouth  daily.    . Flaxseed, Linseed, (FLAX SEED OIL PO) Take 1 tablet by mouth daily.    . LevOCARNitine (L-CARNITINE) 250 MG TABS Take 2 tablets by mouth daily.      Marland Kitchen LORazepam (ATIVAN) 0.5 MG tablet Take 0.5 mg by mouth every 8 (eight) hours as needed for anxiety.    . Lutein 20 MG TABS Take 20 mg by mouth daily.    . magnesium gluconate (MAGONATE) 500 MG tablet Take 500 mg by mouth 2 (two) times daily.      . multivitamin (THERAGRAN) per tablet Take 1 tablet by mouth daily.      . nitroGLYCERIN (NITROSTAT) 0.4 MG SL tablet Place 1 tablet (0.4 mg total) under the tongue every 5 (five) minutes as needed for chest pain. 25 tablet 12  . Potassium 99 MG TABS Take 1 tablet by mouth daily.      Marland Kitchen VITAMIN D, CHOLECALCIFEROL, PO Take 1,000 mg by mouth 2 (two) times daily.     . vitamin E 400 UNIT capsule Take 400 Units by mouth daily.    Marland Kitchen aspirin (BAYER ASPIRIN) 325 MG tablet Take 1 tablet (325 mg total) by mouth daily. (Patient not taking: Reported on 01/11/2015) 28 tablet 0  . docusate sodium (COLACE) 100 MG capsule Take 1 capsule (100 mg total) by mouth daily. (Patient not taking: Reported on 01/11/2015) 30 capsule 0  . HYDROcodone-acetaminophen (NORCO/VICODIN) 5-325 MG per tablet Take 1-2 tablets by mouth every 6 (six) hours as needed for moderate pain. (Patient not taking: Reported on 01/11/2015) 50 tablet 0   No current facility-administered medications for this visit.    ROS: See HPI for pertinent positives and negatives.   Physical Examination  Filed Vitals:   01/11/15 1058 01/11/15 1110  BP: 180/97 176/94  Pulse: 60 62  Temp: 97.4 F (36.3 C)   TempSrc: Oral   Resp: 16   Height: 5' 2.5" (1.588 m)   Weight: 83 lb 3.2 oz (37.739 kg)   SpO2: 97%    Body mass index is 14.97 kg/(m^2).  General: A&O x 3, WDWN, thin female. Gait: slow and deliberate Eyes: PERRLA. Pulmonary: CTAB, without wheezes , rales or rhonchi. Cardiac: regular Rythm , without detected murmur.         Carotid Bruits Right Left   Negative Negative  Aorta is mildly palpable (pt is quite thin). Radial pulses: 2+ bilaterally                           VASCULAR EXAM: Extremities without ischemic changes, without Gangrene; without open wounds.                                                                                                          LE Pulses Right Left       FEMORAL  2+ palpable  1+ palpable        POPLITEAL  not palpable   not palpable       POSTERIOR TIBIAL  not palpable   not  palpable        DORSALIS PEDIS      ANTERIOR TIBIAL not palpable  not palpable    Abdomen: soft, NT, no palpable masses. Skin: no rashes, no ulcers. Musculoskeletal: no muscle wasting or atrophy.  Neurologic: A&O X 3; Appropriate Affect; MOTOR FUNCTION:  moving all  extremities equally, motor strength 5/5 throughout. Speech is fluent/normal. CN 2-12 intact except has.    Non-Invasive Vascular Imaging: DATE: 01/11/2015 LOWER EXTREMITY ARTERIAL DUPLEX EVALUATION    INDICATION: Follow up bypass graft     PREVIOUS INTERVENTION(S): Right distal external iliac to proximal superficial femoral artery bypass graft on 11/19/07    DUPLEX EXAM:     RIGHT  LEFT   Peak Systolic Velocity (cm/s) Ratio (if abnormal) Waveform  Peak Systolic Velocity (cm/s) Ratio (if abnormal) Waveform  73  M Inflow Artery     100  M Proximal Anastomosis     72  B Proximal Graft     31  B Mid Graft     30  B  Distal Graft     Occluded   Distal Anastomosis     Occluded   Outflow Artery     TBI=0.39 Today's ABI / TBI TBI=0.36  TBI=0.40 Previous ABI / TBI (12/29/2013 ) TBI=0.46    Waveform:    M - Monophasic       B - Biphasic       T - Triphasic  If Ankle Brachial Index (ABI) or Toe Brachial Index (TBI) performed, please see complete report     ADDITIONAL FINDINGS: . Elevated velocity of 225cm/s noted in the right proximal profunda femoral artery which appears to be due to a change is vessel diameter and angle of vessel takeoff. . Non-hemodynamically significant plaque noted in the right external iliac artery. . Totally-occlusive plaque noted in the right distal anastomosis and proximal superficial femoral artery.     IMPRESSION: 1. Patent right proximal to distal bypass graft with a known occlusion of the distal anastomosis and native vessel.  Flow from the patent segment of the bypass graft feeds into the profunda femoral artery with an elevated velocity, as described above.  2. Comparison to the previous exam is noted on the second page of this report.      Compared to the previous exam:  No significant change noted when compared to the exam on 12/29/13.      ASSESSMENT: CHRISTNE PLATTS is a 81 y.o. female who is s/p right external iliac to profunda femoris bypass  performed in 2009 for  ischemia of the right leg. She fractured her left femur in February 2016 from falling, had an ORIF. She has left thigh pain with walking, does not seem to have claudication symptoms, no signs of ischemia of lower extremities. Today's right LE arterial Duplex suggests a patent right proximal to distal bypass graft with a known occlusion of the distal anastomosis and native vessel.  Flow from the patent segment of the bypass graft feeds into the profunda femoral artery with an elevated velocity, as described above. ABI's were not obtained due to known non compressible vessels which suggest medial calcification. All waveforms are monophasic. She has no tissue loss in her feet or lower legs.  Elevated blood pressure - pt advised to call her PCP's office today and let them know her blood pressure is elevated, to make an appointment for ASAP. No significant change noted when compared to the exam on 12/29/13.    PLAN:  Daily  exercises as given to pt by physical therapy which will also improve arterial perfusion.. Based on the patient's vascular studies and examination, pt will return to clinic in 1 year with ABI's and right LE arterial Duplex; she and her husband know to return sooner for concerns about the circulation in her legs.  I discussed in depth with the patient the nature of atherosclerosis, and emphasized the importance of maximal medical management including strict control of blood pressure, blood glucose, and lipid levels, obtaining regular exercise, and continued cessation of smoking.  The patient is aware that without maximal medical management the underlying atherosclerotic disease process will progress, limiting the benefit of any interventions.  The patient was given information about PAD including signs, symptoms, treatment, what symptoms should prompt the patient to seek immediate medical care, and risk reduction measures to take.  Clemon Chambers, RN, MSN,  FNP-C [facility] Phone: [phone]  Clinic MD: Kellie Simmering  01/11/2015 11:21 AM

## 2015-01-11 NOTE — Patient Instructions (Signed)

## 2015-01-11 NOTE — Progress Notes (Signed)
Filed Vitals:   01/11/15 1058 01/11/15 1110  BP: 180/97 176/94  Pulse: 60 62  Temp: 97.4 F (36.3 C)   TempSrc: Oral   Resp: 16   Height: 5' 2.5" (1.588 m)   Weight: 83 lb 3.2 oz (37.739 kg)   SpO2: 97%

## 2015-01-12 NOTE — Addendum Note (Signed)
Addended by: Dorthula Rue L on: 01/12/2015 04:59 PM   Modules accepted: Orders

## 2015-01-24 DIAGNOSIS — Z1231 Encounter for screening mammogram for malignant neoplasm of breast: Secondary | ICD-10-CM | POA: Diagnosis not present

## 2015-02-07 DIAGNOSIS — I10 Essential (primary) hypertension: Secondary | ICD-10-CM | POA: Diagnosis not present

## 2015-02-07 DIAGNOSIS — I2510 Atherosclerotic heart disease of native coronary artery without angina pectoris: Secondary | ICD-10-CM | POA: Diagnosis not present

## 2015-03-10 DIAGNOSIS — H11043 Peripheral pterygium, stationary, bilateral: Secondary | ICD-10-CM | POA: Diagnosis not present

## 2015-03-10 DIAGNOSIS — H2513 Age-related nuclear cataract, bilateral: Secondary | ICD-10-CM | POA: Diagnosis not present

## 2015-03-16 DIAGNOSIS — H2511 Age-related nuclear cataract, right eye: Secondary | ICD-10-CM | POA: Diagnosis not present

## 2015-03-23 DIAGNOSIS — H2512 Age-related nuclear cataract, left eye: Secondary | ICD-10-CM | POA: Diagnosis not present

## 2015-03-23 DIAGNOSIS — H2511 Age-related nuclear cataract, right eye: Secondary | ICD-10-CM | POA: Diagnosis not present

## 2015-03-30 DIAGNOSIS — H2512 Age-related nuclear cataract, left eye: Secondary | ICD-10-CM | POA: Diagnosis not present

## 2015-04-25 DIAGNOSIS — Z961 Presence of intraocular lens: Secondary | ICD-10-CM | POA: Diagnosis not present

## 2015-05-27 DIAGNOSIS — I10 Essential (primary) hypertension: Secondary | ICD-10-CM | POA: Diagnosis not present

## 2015-05-27 DIAGNOSIS — I2510 Atherosclerotic heart disease of native coronary artery without angina pectoris: Secondary | ICD-10-CM | POA: Diagnosis not present

## 2015-05-27 DIAGNOSIS — I739 Peripheral vascular disease, unspecified: Secondary | ICD-10-CM | POA: Diagnosis not present

## 2015-05-27 DIAGNOSIS — D559 Anemia due to enzyme disorder, unspecified: Secondary | ICD-10-CM | POA: Diagnosis not present

## 2015-05-27 DIAGNOSIS — E039 Hypothyroidism, unspecified: Secondary | ICD-10-CM | POA: Diagnosis not present

## 2015-05-27 DIAGNOSIS — Z0000 Encounter for general adult medical examination without abnormal findings: Secondary | ICD-10-CM | POA: Diagnosis not present

## 2015-06-08 DIAGNOSIS — I10 Essential (primary) hypertension: Secondary | ICD-10-CM | POA: Diagnosis not present

## 2015-07-18 DIAGNOSIS — I10 Essential (primary) hypertension: Secondary | ICD-10-CM | POA: Diagnosis not present

## 2015-07-18 DIAGNOSIS — I4891 Unspecified atrial fibrillation: Secondary | ICD-10-CM | POA: Diagnosis not present

## 2015-08-08 DIAGNOSIS — I10 Essential (primary) hypertension: Secondary | ICD-10-CM | POA: Diagnosis not present

## 2015-08-08 DIAGNOSIS — Z961 Presence of intraocular lens: Secondary | ICD-10-CM | POA: Diagnosis not present

## 2015-08-25 ENCOUNTER — Ambulatory Visit: Payer: Medicare Other | Admitting: Internal Medicine

## 2015-09-20 DIAGNOSIS — N63 Unspecified lump in breast: Secondary | ICD-10-CM | POA: Diagnosis not present

## 2015-09-24 DIAGNOSIS — S82892A Other fracture of left lower leg, initial encounter for closed fracture: Secondary | ICD-10-CM

## 2015-09-24 HISTORY — DX: Other fracture of left lower leg, initial encounter for closed fracture: S82.892A

## 2015-09-27 ENCOUNTER — Encounter: Payer: Self-pay | Admitting: Internal Medicine

## 2015-09-27 ENCOUNTER — Ambulatory Visit (INDEPENDENT_AMBULATORY_CARE_PROVIDER_SITE_OTHER): Payer: Medicare Other | Admitting: Internal Medicine

## 2015-09-27 VITALS — BP 182/98 | HR 81 | Ht 63.0 in | Wt 83.0 lb

## 2015-09-27 DIAGNOSIS — S72002S Fracture of unspecified part of neck of left femur, sequela: Secondary | ICD-10-CM

## 2015-09-27 DIAGNOSIS — I4892 Unspecified atrial flutter: Secondary | ICD-10-CM | POA: Diagnosis not present

## 2015-09-27 MED ORDER — CARVEDILOL 6.25 MG PO TABS
6.2500 mg | ORAL_TABLET | Freq: Two times a day (BID) | ORAL | Status: DC
Start: 2015-09-27 — End: 2018-10-23

## 2015-09-27 NOTE — Patient Instructions (Signed)
Medication Instructions:  Your physician has recommended you make the following change in your medication:  1) Increase Carvedilol to 6.25mg  twice daily   Labwork: None ordered   Testing/Procedures: None ordered   Follow-Up: Your physician wants you to follow-up in: 12 months with Dr Knox Saliva will receive a reminder letter in the mail two months in advance. If you don't receive a letter, please call our office to schedule the follow-up appointment.   Any Other Special Instructions Will Be Listed Below (If Applicable).     If you need a refill on your cardiac medications before your next appointment, please call your pharmacy.

## 2015-09-27 NOTE — Progress Notes (Signed)
HPI Michelle Gaines returns today for followup. She is a very pleasant 82 yo woman with a h/o atrial flutter s/p catheter ablation.  She notes that her blood pressure increased over the past few months. She denies medical non-compliance.  Allergies  Allergen Reactions  . Fexofenadine Shortness Of Breath  . Cephalosporins Rash  . Amlodipine   . Anaprox [Naproxen Sodium]   . Benzonatate   . Cephalexin   . Clindamycin/Lincomycin   . Clorazepate   . Codeine   . Diltiazem     Dizziness   . Doxycycline   . Hydralazine   . Labetalol   . Lisinopril   . Phenylephrine   . Phenytoin   . Zolpidem   . Penicillins Nausea And Vomiting, Swelling and Rash     Current Outpatient Prescriptions  Medication Sig Dispense Refill  . Ascorbic Acid (VITAMIN C) 1000 MG tablet Take 1,000 mg by mouth daily.      Marland Kitchen aspirin (BAYER ASPIRIN) 325 MG tablet Take 1 tablet (325 mg total) by mouth daily. (Patient not taking: Reported on 01/11/2015) 28 tablet 0  . carvedilol (COREG) 6.25 MG tablet Take 1 tablet (6.25 mg total) by mouth 2 (two) times daily with a meal. Okay to take one additional tablet daily as needed SBP > 150. 180 tablet 3  . cyanocobalamin 100 MCG tablet Take 100 mcg by mouth daily.    Marland Kitchen docusate sodium (COLACE) 100 MG capsule Take 1 capsule (100 mg total) by mouth daily. (Patient not taking: Reported on 01/11/2015) 30 capsule 0  . Flaxseed, Linseed, (FLAX SEED OIL PO) Take 1 tablet by mouth daily.    Marland Kitchen HYDROcodone-acetaminophen (NORCO/VICODIN) 5-325 MG per tablet Take 1-2 tablets by mouth every 6 (six) hours as needed for moderate pain. (Patient not taking: Reported on 01/11/2015) 50 tablet 0  . LevOCARNitine (L-CARNITINE) 250 MG TABS Take 2 tablets by mouth daily.      Marland Kitchen LORazepam (ATIVAN) 0.5 MG tablet Take 0.5 mg by mouth every 8 (eight) hours as needed for anxiety.    . Lutein 20 MG TABS Take 20 mg by mouth daily.    . magnesium gluconate (MAGONATE) 500 MG tablet Take 500 mg by mouth 2  (two) times daily.      . multivitamin (THERAGRAN) per tablet Take 1 tablet by mouth daily.      . nitroGLYCERIN (NITROSTAT) 0.4 MG SL tablet Place 1 tablet (0.4 mg total) under the tongue every 5 (five) minutes as needed for chest pain. 25 tablet 12  . Potassium 99 MG TABS Take 1 tablet by mouth daily.      Marland Kitchen VITAMIN D, CHOLECALCIFEROL, PO Take 1,000 mg by mouth 2 (two) times daily.    . vitamin E 400 UNIT capsule Take 400 Units by mouth daily.     No current facility-administered medications for this visit.     Past Medical History  Diagnosis Date  . Hypertension   . Coronary artery disease   . PVD (peripheral vascular disease) ([place])   . Peripheral arterial disease ([place])   . Dysrhythmia     ATRIAL FLUTTER  . GERD (gastroesophageal reflux disease)   . Cancer ([place])     OVARIAN    1967  . Arthritis   . Atrial flutter ([place])     ROS:   All systems reviewed and negative except as noted in the HPI.   Past Surgical History  Procedure Laterality Date  . Bpg    .  Pr vein bypass graft,aorto-fem-pop      Right   . Gliayte catherter insertion  06/27/10  . Carotid stent  11/30/09    LAD S/P Right CA DES  . Abdominal hysterectomy    . Atrial flutter ablation N/A 05/22/2013    Procedure: ATRIAL FLUTTER ABLATION;  Surgeon: Evans Lance, MD;  Location[facility] CATH LAB;  Service: Cardiovascular;  Laterality: N/A;  . Intramedullary (im) nail intertrochanteric Left 07/28/2014    Procedure: Left Affixus Nail;  Surgeon: Marybelle Killings, MD;  Location: [place];  Service: Orthopedics;  Laterality: Left;  . Fracture surgery Left Feb. 2, 2016    Left Hip Fx      Family History  Problem Relation Age of Onset  . COPD Father   . Hypertension Father      Social History   Social History  . Marital Status: Married    Spouse Name: N/A  . Number of Children: N/A  . Years of Education: N/A   Occupational History  . Not on file.   Social History Main Topics  . Smoking status: Never Smoker   .  Smokeless tobacco: Never Used  . Alcohol Use: No  . Drug Use: No  . Sexual Activity: Not on file   Other Topics Concern  . Not on file   Social History Narrative     BP 182/98 mmHg  Pulse 81  Ht 5\' 3"  (1.6 m)  Wt 83 lb (37.649 kg)  BMI 14.71 kg/m2  Physical Exam:  frail appearing 82 yo woman, NAD HEENT: Unremarkable Neck:  6 cm JVD, no thyromegall Back:  No CVA tenderness Lungs:  Clear with no wheezes HEART:  Regular rate rhythm, no murmurs, no rubs, no clicks Abd:  soft, positive bowel sounds, no organomegally, no rebound, no guarding Ext:  2 plus pulses, no edema, no cyanosis, no clubbing Skin:  No rashes no nodules Neuro:  CN II through XII intact, motor grossly intact  ECG - nsr with pac's  Assess/Plan: 1. Atrial flutter - she is doing well, s/p ablation 2. HTN - her blood pressure is not well controlled. She will continue her coreg and increase to 6.25 mg twice daily. 3. Malnutrition - she denies a reduction in calorie count. She is encouraged to increase her oral intake.  Mikle Bosworth.D.

## 2015-10-12 ENCOUNTER — Emergency Department (HOSPITAL_COMMUNITY): Payer: Medicare Other

## 2015-10-12 ENCOUNTER — Observation Stay (HOSPITAL_COMMUNITY)
Admission: EM | Admit: 2015-10-12 | Discharge: 2015-10-14 | Disposition: A | Discharge status: Home Health | Payer: Medicare Other | Attending: Internal Medicine | Admitting: Internal Medicine

## 2015-10-12 ENCOUNTER — Encounter (HOSPITAL_COMMUNITY): Payer: Self-pay | Admitting: Emergency Medicine

## 2015-10-12 DIAGNOSIS — Z79899 Other long term (current) drug therapy: Secondary | ICD-10-CM | POA: Diagnosis not present

## 2015-10-12 DIAGNOSIS — I4892 Unspecified atrial flutter: Secondary | ICD-10-CM | POA: Insufficient documentation

## 2015-10-12 DIAGNOSIS — M810 Age-related osteoporosis without current pathological fracture: Secondary | ICD-10-CM | POA: Insufficient documentation

## 2015-10-12 DIAGNOSIS — S82892A Other fracture of left lower leg, initial encounter for closed fracture: Secondary | ICD-10-CM | POA: Diagnosis not present

## 2015-10-12 DIAGNOSIS — K219 Gastro-esophageal reflux disease without esophagitis: Secondary | ICD-10-CM | POA: Insufficient documentation

## 2015-10-12 DIAGNOSIS — I739 Peripheral vascular disease, unspecified: Secondary | ICD-10-CM | POA: Diagnosis not present

## 2015-10-12 DIAGNOSIS — I10 Essential (primary) hypertension: Secondary | ICD-10-CM | POA: Diagnosis not present

## 2015-10-12 DIAGNOSIS — W101XXA Fall (on)(from) sidewalk curb, initial encounter: Secondary | ICD-10-CM | POA: Insufficient documentation

## 2015-10-12 DIAGNOSIS — S82832A Other fracture of upper and lower end of left fibula, initial encounter for closed fracture: Secondary | ICD-10-CM | POA: Diagnosis not present

## 2015-10-12 DIAGNOSIS — Z8543 Personal history of malignant neoplasm of ovary: Secondary | ICD-10-CM | POA: Insufficient documentation

## 2015-10-12 DIAGNOSIS — F419 Anxiety disorder, unspecified: Secondary | ICD-10-CM | POA: Diagnosis present

## 2015-10-12 DIAGNOSIS — S82842A Displaced bimalleolar fracture of left lower leg, initial encounter for closed fracture: Secondary | ICD-10-CM | POA: Diagnosis not present

## 2015-10-12 DIAGNOSIS — S82392A Other fracture of lower end of left tibia, initial encounter for closed fracture: Secondary | ICD-10-CM | POA: Diagnosis not present

## 2015-10-12 DIAGNOSIS — Z955 Presence of coronary angioplasty implant and graft: Secondary | ICD-10-CM | POA: Diagnosis not present

## 2015-10-12 DIAGNOSIS — G2581 Restless legs syndrome: Secondary | ICD-10-CM | POA: Diagnosis not present

## 2015-10-12 DIAGNOSIS — Z9181 History of falling: Secondary | ICD-10-CM | POA: Diagnosis not present

## 2015-10-12 DIAGNOSIS — M25572 Pain in left ankle and joints of left foot: Secondary | ICD-10-CM | POA: Diagnosis present

## 2015-10-12 DIAGNOSIS — S82899A Other fracture of unspecified lower leg, initial encounter for closed fracture: Secondary | ICD-10-CM | POA: Diagnosis present

## 2015-10-12 DIAGNOSIS — Z95828 Presence of other vascular implants and grafts: Secondary | ICD-10-CM | POA: Diagnosis not present

## 2015-10-12 DIAGNOSIS — I2510 Atherosclerotic heart disease of native coronary artery without angina pectoris: Secondary | ICD-10-CM | POA: Diagnosis not present

## 2015-10-12 DIAGNOSIS — M25551 Pain in right hip: Secondary | ICD-10-CM | POA: Diagnosis not present

## 2015-10-12 DIAGNOSIS — W19XXXA Unspecified fall, initial encounter: Secondary | ICD-10-CM | POA: Diagnosis present

## 2015-10-12 DIAGNOSIS — S82843A Displaced bimalleolar fracture of unspecified lower leg, initial encounter for closed fracture: Secondary | ICD-10-CM

## 2015-10-12 HISTORY — DX: Other fracture of left lower leg, initial encounter for closed fracture: S82.892A

## 2015-10-12 LAB — BASIC METABOLIC PANEL
Anion gap: 12 (ref 5–15)
BUN: 17 mg/dL (ref 6–20)
CALCIUM: 9.4 mg/dL (ref 8.9–10.3)
CO2: 25 mmol/L (ref 22–32)
CREATININE: 0.87 mg/dL (ref 0.44–1.00)
Chloride: 104 mmol/L (ref 101–111)
GFR calc Af Amer: >60 mL/min (ref >60)
GFR, EST NON AFRICAN AMERICAN: 59 mL/min — AB (ref >60)
Glucose, Bld: 96 mg/dL (ref 65–99)
POTASSIUM: 3.1 mmol/L — AB (ref 3.5–5.1)
SODIUM: 141 mmol/L (ref 135–145)

## 2015-10-12 LAB — CBC
HCT: 41.1 % (ref 36.0–46.0)
Hemoglobin: 13.5 g/dL (ref 12.0–15.0)
MCH: 31.8 pg (ref 26.0–34.0)
MCHC: 32.8 g/dL (ref 30.0–36.0)
MCV: 96.9 fL (ref 78.0–100.0)
PLATELETS: 155 10*3/uL (ref 150–400)
RBC: 4.24 MIL/uL (ref 3.87–5.11)
RDW: 13.2 % (ref 11.5–15.5)
WBC: 8.7 10*3/uL (ref 4.0–10.5)

## 2015-10-12 LAB — PROTIME-INR
INR: 1.13 (ref 0.00–1.49)
Prothrombin Time: 14.7 seconds (ref 11.6–15.2)

## 2015-10-12 LAB — TYPE AND SCREEN
ABO/RH(D): O POS
Antibody Screen: NEGATIVE

## 2015-10-12 LAB — APTT: aPTT: 32 seconds (ref 24–37)

## 2015-10-12 MED ORDER — MAGNESIUM GLUCONATE 500 MG PO TABS: 500.0000 mg | ORAL_TABLET | Freq: Two times a day (BID) | ORAL | Status: DC

## 2015-10-12 MED ORDER — ADULT MULTIVITAMIN W/MINERALS CH
1.0000 | ORAL_TABLET | Freq: Every day | ORAL | Status: DC
  Administered 2015-10-13 – 2015-10-14 (×2): 1 via ORAL
  Filled 2015-10-12 (×2): qty 1

## 2015-10-12 MED ORDER — FLAX SEED OIL 1000 MG PO CAPS: 1.0000 | ORAL_CAPSULE | Freq: Every day | ORAL | Status: DC

## 2015-10-12 MED ORDER — ONDANSETRON HCL 4 MG/2ML IJ SOLN: 4.0000 mg | Freq: Three times a day (TID) | INTRAMUSCULAR | Status: DC | PRN

## 2015-10-12 MED ORDER — NITROGLYCERIN 0.4 MG SL SUBL: 0.4000 mg | SUBLINGUAL_TABLET | SUBLINGUAL | Status: DC | PRN

## 2015-10-12 MED ORDER — VITAMIN E 180 MG (400 UNIT) PO CAPS
400.0000 [IU] | ORAL_CAPSULE | Freq: Every day | ORAL | Status: DC
  Administered 2015-10-13 – 2015-10-14 (×2): 400 [IU] via ORAL
  Filled 2015-10-12 (×2): qty 1

## 2015-10-12 MED ORDER — VITAMIN D 1000 UNITS PO TABS
1000.0000 [IU] | ORAL_TABLET | Freq: Every day | ORAL | Status: DC
  Administered 2015-10-13 – 2015-10-14 (×2): 1000 [IU] via ORAL
  Filled 2015-10-12 (×2): qty 1

## 2015-10-12 MED ORDER — CLONIDINE HCL 0.2 MG PO TABS
0.2000 mg | ORAL_TABLET | Freq: Three times a day (TID) | ORAL | Status: DC | PRN
  Administered 2015-10-12: 0.2 mg via ORAL
  Filled 2015-10-12 (×2): qty 1

## 2015-10-12 MED ORDER — SODIUM CHLORIDE 0.9 % IV SOLN
INTRAVENOUS | Status: DC
  Administered 2015-10-12: 22:00:00 via INTRAVENOUS

## 2015-10-12 MED ORDER — MAGNESIUM OXIDE 400 (241.3 MG) MG PO TABS
400.0000 mg | ORAL_TABLET | Freq: Two times a day (BID) | ORAL | Status: DC
  Administered 2015-10-12 – 2015-10-14 (×4): 400 mg via ORAL
  Filled 2015-10-12 (×4): qty 1

## 2015-10-12 MED ORDER — BETA CAROTENE 15 MG PO CAPS: 15.0000 mg | ORAL_CAPSULE | Freq: Every day | ORAL | Status: DC

## 2015-10-12 MED ORDER — LORAZEPAM 0.5 MG PO TABS
0.5000 mg | ORAL_TABLET | Freq: Every day | ORAL | Status: DC
  Administered 2015-10-12 – 2015-10-13 (×2): 0.5 mg via ORAL
  Filled 2015-10-12 (×2): qty 1

## 2015-10-12 MED ORDER — POTASSIUM 99 MG PO TABS: 1.0000 | ORAL_TABLET | Freq: Every day | ORAL | Status: DC

## 2015-10-12 MED ORDER — CARVEDILOL 6.25 MG PO TABS
6.2500 mg | ORAL_TABLET | Freq: Two times a day (BID) | ORAL | Status: DC
  Administered 2015-10-12 – 2015-10-14 (×3): 6.25 mg via ORAL
  Filled 2015-10-12 (×3): qty 1

## 2015-10-12 MED ORDER — HYDROCODONE-ACETAMINOPHEN 5-325 MG PO TABS
1.0000 | ORAL_TABLET | Freq: Four times a day (QID) | ORAL | Status: DC | PRN
  Administered 2015-10-12 – 2015-10-14 (×5): 1 via ORAL
  Filled 2015-10-12 (×6): qty 1

## 2015-10-12 MED ORDER — LUTEIN 20 MG PO TABS
20.0000 mg | ORAL_TABLET | Freq: Every day | ORAL | Status: DC
Start: 2015-10-12 — End: 2015-10-12

## 2015-10-12 MED ORDER — VITAMIN B-12 100 MCG PO TABS
100.0000 ug | ORAL_TABLET | Freq: Every day | ORAL | Status: DC
  Administered 2015-10-13 – 2015-10-14 (×2): 100 ug via ORAL
  Filled 2015-10-12 (×2): qty 1

## 2015-10-12 MED ORDER — FENTANYL CITRATE (PF) 100 MCG/2ML IJ SOLN
50.0000 ug | Freq: Once | INTRAMUSCULAR | Status: AC
  Administered 2015-10-12: 50 ug via INTRAVENOUS
  Filled 2015-10-12: qty 2

## 2015-10-12 MED ORDER — SODIUM CHLORIDE 0.9% FLUSH
3.0000 mL | Freq: Two times a day (BID) | INTRAVENOUS | Status: DC
  Administered 2015-10-13 – 2015-10-14 (×2): 3 mL via INTRAVENOUS

## 2015-10-12 MED ORDER — SLEEP MEDICINE PO TABS: 1.0000 | ORAL_TABLET | Freq: Every day | ORAL | Status: DC

## 2015-10-12 MED ORDER — ONDANSETRON HCL 4 MG/2ML IJ SOLN
4.0000 mg | Freq: Once | INTRAMUSCULAR | Status: AC
  Administered 2015-10-12: 4 mg via INTRAVENOUS
  Filled 2015-10-12: qty 2

## 2015-10-12 MED ORDER — UBIQUINOL 50 MG PO CAPS: 50.0000 mg | ORAL_CAPSULE | Freq: Two times a day (BID) | ORAL | Status: DC

## 2015-10-12 MED ORDER — POTASSIUM CHLORIDE 20 MEQ/15ML (10%) PO SOLN
40.0000 meq | Freq: Once | ORAL | Status: AC
  Administered 2015-10-12: 40 meq via ORAL
  Filled 2015-10-12: qty 30

## 2015-10-12 NOTE — ED Provider Notes (Signed)
CSN: [id number]     Arrival date & time 10/12/15  1446 History   First MD Initiated Contact with Patient 10/12/15 1735     Chief Complaint  Patient presents with  . Fall     (Consider location/radiation/quality/duration/timing/severity/associated sxs/prior Treatment) HPI  The patient is an 82 year old female, she has a history of hypertension, coronary disease, ovarian cancer, she also has a history of a prior femoral fracture at the left hip in February 2016, she has had aortofemoral popliteal bypass on the right. She reports that she is finally ambulating and able to go up and down stairs but today while she was stepping off of a curb she stepped down, inverted her left ankle and suffered acute onset of pain, deformity and the inability to walk and ambulate. She was assisted back to the vehicle with a friend and a family member, she is unable to bear weight, she has significant pain in the ankle. She denies any other injuries other than some abrasions to the knuckles of the left hand while she was falling. This was acute in onset, the symptoms are persistent, worse with minimal relation of the ankle. She denies any other symptoms systemic symptoms or numbness or weakness   Past Medical History  Diagnosis Date  . Hypertension   . Coronary artery disease   . PVD (peripheral vascular disease) ([place])   . Peripheral arterial disease ([place])   . Dysrhythmia     ATRIAL FLUTTER  . GERD (gastroesophageal reflux disease)   . Cancer ([place])     OVARIAN    1967  . Arthritis   . Atrial flutter [facility])    Past Surgical History  Procedure Laterality Date  . Bpg    . Pr vein bypass graft,aorto-fem-pop      Right   . Gliayte catherter insertion  06/27/10  . Carotid stent  11/30/09    LAD S/P Right CA DES  . Abdominal hysterectomy    . Atrial flutter ablation N/A 05/22/2013    Procedure: ATRIAL FLUTTER ABLATION;  Surgeon: Evans Lance, MD;  Location: [facility] CATH LAB;  Service: Cardiovascular;  Laterality:  N/A;  . Intramedullary (im) nail intertrochanteric Left 07/28/2014    Procedure: Left Affixus Nail;  Surgeon: Marybelle Killings, MD;  Location: [place];  Service: Orthopedics;  Laterality: Left;  . Fracture surgery Left Feb. 2, 2016    Left Hip Fx    Family History  Problem Relation Age of Onset  . COPD Father   . Hypertension Father    Social History  Substance Use Topics  . Smoking status: Never Smoker   . Smokeless tobacco: Never Used  . Alcohol Use: No   OB History    No data available     Review of Systems  All other systems reviewed and are negative.     Allergies  Fexofenadine; Cephalosporins; Diltiazem; Penicillins; Amlodipine; Anaprox; Benzonatate; Cephalexin; Clindamycin/lincomycin; Clorazepate; Codeine; Doxycycline; Hydralazine; Labetalol; Lisinopril; Phenylephrine; Phenytoin; and Zolpidem  Home Medications   Prior to Admission medications   Medication Sig Start Date End Date Taking? Authorizing Provider  BETA CAROTENE PO Take 1 tablet by mouth daily.   Yes Historical Provider, MD  carvedilol (COREG) 6.25 MG tablet Take 1 tablet (6.25 mg total) by mouth 2 (two) times daily with a meal. Okay to take one additional tablet daily as needed SBP > 150. 09/27/15  Yes Evans Lance, MD  cyanocobalamin 100 MCG tablet Take 100 mcg by mouth daily.   Yes Historical  Provider, MD  Flaxseed, Linseed, (FLAX SEED OIL PO) Take 1 tablet by mouth daily.   Yes Historical Provider, MD  Homeopathic Products (SLEEP MEDICINE) TABS Take 1 tablet by mouth at bedtime.   Yes Historical Provider, MD  LORazepam (ATIVAN) 0.5 MG tablet Take 0.5 mg by mouth at bedtime.    Yes Historical Provider, MD  Lutein 20 MG TABS Take 20 mg by mouth daily.   Yes Historical Provider, MD  magnesium gluconate (MAGONATE) 500 MG tablet Take 500 mg by mouth 2 (two) times daily.     Yes Historical Provider, MD  multivitamin [facility]) per tablet Take 1 tablet by mouth daily.     Yes Historical Provider, MD  nitroGLYCERIN  (NITROSTAT) 0.4 MG SL tablet Place 1 tablet (0.4 mg total) under the tongue every 5 (five) minutes as needed for chest pain. 05/23/13  Yes Rhonda G Barrett, PA-C  Potassium 99 MG TABS Take 1 tablet by mouth daily.     Yes Historical Provider, MD  Ubiquinol 50 MG CAPS Take 50 mg by mouth 2 (two) times daily.   Yes Historical Provider, MD  VITAMIN D, CHOLECALCIFEROL, PO Take 1,000 mg by mouth daily.    Yes Historical Provider, MD  vitamin E 400 UNIT capsule Take 400 Units by mouth daily.   Yes Historical Provider, MD  aspirin (BAYER ASPIRIN) 325 MG tablet Take 1 tablet (325 mg total) by mouth daily. Patient not taking: Reported on 01/11/2015 07/30/14   Marybelle Killings, MD  docusate sodium (COLACE) 100 MG capsule Take 1 capsule (100 mg total) by mouth daily. Patient not taking: Reported on 01/11/2015 07/30/14   Albertine Patricia, MD  HYDROcodone-acetaminophen (NORCO/VICODIN) 5-325 MG per tablet Take 1-2 tablets by mouth every 6 (six) hours as needed for moderate pain. Patient not taking: Reported on 01/11/2015 07/30/14   Marybelle Killings, MD   BP 218/90 mmHg  Pulse 40  Temp(Src) 97.8 F (36.6 C) (Oral)  Resp 17  Ht 5\' 4"  (1.626 m)  Wt 85 lb (38.556 kg)  BMI 14.58 kg/m2  SpO2 100% Physical Exam  Constitutional: She appears well-developed and well-nourished. No distress.  HENT:  Head: Normocephalic and atraumatic.  Mouth/Throat: Oropharynx is clear and moist. No oropharyngeal exudate.  Eyes: Conjunctivae and EOM are normal. Pupils are equal, round, and reactive to light. Right eye exhibits no discharge. Left eye exhibits no discharge. No scleral icterus.  Neck: Normal range of motion. Neck supple. No JVD present. No thyromegaly present.  Cardiovascular: Normal rate, regular rhythm, normal heart sounds and intact distal pulses.  Exam reveals no gallop and no friction rub.   No murmur heard. Normal pulses at the dorsalis pedis bilaterally  Pulmonary/Chest: Effort normal and breath sounds normal. No  respiratory distress. She has no wheezes. She has no rales.  Abdominal: Soft. Bowel sounds are normal. She exhibits no distension and no mass. There is no tenderness.  Musculoskeletal: Normal range of motion. She exhibits no edema or tenderness.  There is a slight leg length discrepancy however the left lower extremity has an inverted left ankle with palpable deformity along the medial malleolus as well as tenderness over the distal lateral malleolus   Lymphadenopathy:    She has no cervical adenopathy.  Neurological: She is alert. Coordination normal.  Normal sensation bilaterally  Skin: Skin is warm and dry. No rash noted. No erythema.  Psychiatric: She has a normal mood and affect. Her behavior is normal.  Nursing note and vitals reviewed.  ED Course  Procedures (including critical care time) Labs Review Labs Reviewed  BASIC METABOLIC PANEL - Abnormal; Notable for the following:    Potassium 3.1 (*)    GFR calc non Af Amer 59 (*)    All other components within normal limits  CBC  MAGNESIUM  PROTIME-INR  APTT  BASIC METABOLIC PANEL  CBC  TYPE AND SCREEN    Imaging Review Dg Ankle Complete Left  10/12/2015  CLINICAL DATA:  Inversion injury of the ankle.  Swelling. EXAM: LEFT ANKLE COMPLETE - 3+ VIEW COMPARISON:  None. FINDINGS: Bony demineralization. Transverse fracture of the lateral malleolus proximally. Fractures the distal tibia at the base of the medial malleolus, possibly extending transversely through the distal tibial metaphysis given the poor definition of tibial plafond involvement. IMPRESSION: 1. Transverse fracture of the distal fibula metadiaphysis. Transverse fracture of the distal tibia, extending through the base of the medial malleolus and also probably through the distal tibial metaphysis given the lack of visible extension to the tibial plafond. This is an unusual pattern and possibly related to the underlying bony demineralization. If further clarification of  involvement of the distal tibial articular surface is warranted, CT would be recommended. Electronically Signed   By: Van Clines M.D.   On: 10/12/2015 15:22   Dg Hip Unilat With Pelvis 2-3 Views Left  10/12/2015  CLINICAL DATA:  Fall.  Left hip pain.  Previous left hip fracture. EXAM: DG HIP (WITH OR WITHOUT PELVIS) 2-3V LEFT COMPARISON:  07/27/2014 FINDINGS: Compression screw and intramedullary rod are seen within the left hip. No evidence of acute fracture or dislocation. Generalized osteopenia noted as well as extensive peripheral vascular calcification. IMPRESSION: Internal fixation of old left hip fracture.  No acute findings. Electronically Signed   By: Earle Gell M.D.   On: 10/12/2015 20:19   I have personally reviewed and evaluated these images and lab results as part of my medical decision-making.   MDM   Final diagnoses:  Fall  Bimalleolar ankle fracture, left, closed, initial encounter  Severe hypertension    The patient does have significant tenderness, there is obvious deformity, x-ray confirms that she does have a bimalleolar fracture. She does have significant hypertension, she has no systemic complaints, she will be given pain medication, splint, attempt ambulation with assistance, if she is unable to ambulate she will be admitted for repair in the morning otherwise I have spoken with the orthopedist who will see her in the office tomorrow if she is able to be discharged. (Dr. Sharol Given)  D/w Dr. Sharol Given who will see pt in hospital in the morning - d/w Dr. Blaine Hamper who will admit.    The pt has allergies to all classes of antihypertensives - I have asked for clonidine as her BP is much to high - she is not bradycardic - the VS listed were incorrect.  The pt has good pain control.  Meds given in ED:  Medications  cloNIDine (CATAPRES) tablet 0.2 mg (0.2 mg Oral Given 10/12/15 2059)                                        fentaNYL (SUBLIMAZE) injection 50 mcg (50  mcg Intravenous Given 10/12/15 1827)  ondansetron (ZOFRAN) injection 4 mg (4 mg Intravenous Given 10/12/15 1851)       Noemi Chapel, MD 10/12/15 2128

## 2015-10-12 NOTE — ED Notes (Signed)
hospitalist at the bedside 

## 2015-10-12 NOTE — ED Notes (Signed)
Pt BP high, MD aware, pain medication given by Day shift.

## 2015-10-12 NOTE — H&P (Addendum)
History and Physical    Michelle Gaines [id number] DOB: [birth year] DOA: 10/12/2015  Referring MD/NP/PA:   PCP: Criselda Peaches, MD   Outpatient Specialists: none  Patient coming from:  Home  Chief Complaint: left ankle pain after fall  HPI: Michelle Gaines is a 82 y.o. female with medical history significant of hypertension, GERD, anxiety, CAD (s/p stent), PAD (s/p of aortofemoral popliteal bypass on the right), overt cancer, atrial fibrillation not on anticoagulants possibly due to high risk of fall, arthritis, hip fracture, who presents with left ankle pain after fall.  Pt reports that she had femoral fracture at the left hip in February 2016. She has been doing rehab and finally ambulating and able to go up and down stairs. She states that today while she was stepping off of a curb she stepped down, inverted her left ankle. She developed pain and swelling over left ankle. She is unable to bear weight. She dose not have tingling sensation in left leg. Patient does not have dizziness, chest pain, abdominal pain, diarrhea, symptoms of UTI or unilateral weakness. No fever or chills.  ED Course: pt was found to have WBC 8.7, temperature normal, no tachycardia, elevated blood pressure at 222/98 which improved to 170/116 after a dose of clonidine 0.2 mg. Potassium 3.1, creatinine 0.87. X-ray of left hip is negative for acute issues, but showed old Internal fixation of old left hip fracture.  # X ray of left ankle showed transverse fracture of the distal fibula metadiaphysis.Transverse fracture of the distal tibia, extending through the base of the medial malleolus and also probably through the distal tibial metaphysis given the lack of visible extension to the tibial plafond. This is an unusual pattern and possibly related to the underlying bony demineralization. If further clarification of involvement of the distal tibial articular surface is warranted, CT would be recommended.  Can  patient participate in ADLs?   Little   Review of Systems:   General: no fevers, chills, no changes in body weight, has fatigue HEENT: no blurry vision, hearing changes or sore throat Pulm: no dyspnea, coughing, wheezing CV: no chest pain, no palpitations Abd: no nausea, vomiting, abdominal pain, diarrhea, constipation GU: no dysuria, burning on urination, increased urinary frequency, hematuria  Ext: no leg edema. Has pain over left ankle. Neuro: no unilateral weakness, numbness, or tingling, no vision change or hearing loss Skin: no rash MSK: No muscle spasm, no deformity, no limitation of range of movement in spin Heme: No easy bruising.  Travel history: No recent long distant travel.  Allergy:  Allergies  Allergen Reactions  . Fexofenadine Shortness Of Breath  . Cephalosporins Rash  . Diltiazem Other (See Comments)    Dizziness   . Penicillins Nausea And Vomiting, Swelling and Rash  . Amlodipine Other (See Comments)  . Anaprox [Naproxen Sodium] Other (See Comments)  . Benzonatate Other (See Comments)  . Cephalexin Other (See Comments)  . Clindamycin/Lincomycin Other (See Comments)  . Clorazepate Other (See Comments)  . Codeine Other (See Comments)  . Doxycycline Other (See Comments)  . Hydralazine Other (See Comments)  . Labetalol Other (See Comments)  . Lisinopril Other (See Comments)  . Phenylephrine Other (See Comments)  . Phenytoin Other (See Comments)  . Zolpidem Other (See Comments)    Past Medical History  Diagnosis Date  . Hypertension   . Coronary artery disease   . PVD (peripheral vascular disease) ([place])   . Peripheral arterial disease ([place])   . Dysrhythmia  ATRIAL FLUTTER  . GERD (gastroesophageal reflux disease)   . Cancer ([place])     OVARIAN    1967  . Arthritis   . Atrial flutter [facility])     Past Surgical History  Procedure Laterality Date  . Bpg    . Pr vein bypass graft,aorto-fem-pop      Right   . Gliayte catherter insertion  06/27/10  .  Carotid stent  11/30/09    LAD S/P Right CA DES  . Abdominal hysterectomy    . Atrial flutter ablation N/A 05/22/2013    Procedure: ATRIAL FLUTTER ABLATION;  Surgeon: Evans Lance, MD;  Location: [facility] CATH LAB;  Service: Cardiovascular;  Laterality: N/A;  . Intramedullary (im) nail intertrochanteric Left 07/28/2014    Procedure: Left Affixus Nail;  Surgeon: Marybelle Killings, MD;  Location: [place];  Service: Orthopedics;  Laterality: Left;  . Fracture surgery Left Feb. 2, 2016    Left Hip Fx     Social History:  reports that she has never smoked. She has never used smokeless tobacco. She reports that she does not drink alcohol or use illicit drugs.  Family History:  Family History  Problem Relation Age of Onset  . COPD Father   . Hypertension Father      Prior to Admission medications   Medication Sig Start Date End Date Taking? Authorizing Provider  BETA CAROTENE PO Take 1 tablet by mouth daily.   Yes Historical Provider, MD  carvedilol (COREG) 6.25 MG tablet Take 1 tablet (6.25 mg total) by mouth 2 (two) times daily with a meal. Okay to take one additional tablet daily as needed SBP > 150. 09/27/15  Yes Evans Lance, MD  cyanocobalamin 100 MCG tablet Take 100 mcg by mouth daily.   Yes Historical Provider, MD  Flaxseed, Linseed, (FLAX SEED OIL PO) Take 1 tablet by mouth daily.   Yes Historical Provider, MD  Homeopathic Products (SLEEP MEDICINE) TABS Take 1 tablet by mouth at bedtime.   Yes Historical Provider, MD  LORazepam (ATIVAN) 0.5 MG tablet Take 0.5 mg by mouth at bedtime.    Yes Historical Provider, MD  Lutein 20 MG TABS Take 20 mg by mouth daily.   Yes Historical Provider, MD  magnesium gluconate (MAGONATE) 500 MG tablet Take 500 mg by mouth 2 (two) times daily.     Yes Historical Provider, MD  multivitamin [facility]) per tablet Take 1 tablet by mouth daily.     Yes Historical Provider, MD  nitroGLYCERIN (NITROSTAT) 0.4 MG SL tablet Place 1 tablet (0.4 mg total) under the tongue every  5 (five) minutes as needed for chest pain. 05/23/13  Yes Rhonda G Barrett, PA-C  Potassium 99 MG TABS Take 1 tablet by mouth daily.     Yes Historical Provider, MD  Ubiquinol 50 MG CAPS Take 50 mg by mouth 2 (two) times daily.   Yes Historical Provider, MD  VITAMIN D, CHOLECALCIFEROL, PO Take 1,000 mg by mouth daily.    Yes Historical Provider, MD  vitamin E 400 UNIT capsule Take 400 Units by mouth daily.   Yes Historical Provider, MD  aspirin (BAYER ASPIRIN) 325 MG tablet Take 1 tablet (325 mg total) by mouth daily. Patient not taking: Reported on 01/11/2015 07/30/14   Marybelle Killings, MD  docusate sodium (COLACE) 100 MG capsule Take 1 capsule (100 mg total) by mouth daily. Patient not taking: Reported on 01/11/2015 07/30/14   Albertine Patricia, MD  HYDROcodone-acetaminophen (NORCO/VICODIN) 5-325 MG per tablet  Take 1-2 tablets by mouth every 6 (six) hours as needed for moderate pain. Patient not taking: Reported on 01/11/2015 07/30/14   Marybelle Killings, MD    Physical Exam: Filed Vitals:   10/12/15 2009 10/12/15 2015 10/12/15 2030 10/12/15 2115  BP: 222/98 206/83 214/86 218/90  Pulse: 68 70 65 40  Temp:      TempSrc:      Resp:      Height:      Weight:      SpO2: 99% 99% 100% 100%   General: Not in acute distress HEENT:       Eyes: PERRL, EOMI, no scleral icterus.       ENT: No discharge from the ears and nose, no pharynx injection, no tonsillar enlargement.        Neck: No JVD, no bruit, no mass felt. Heme: No neck lymph node enlargement. Cardiac: S1/S2, RRR, No murmurs, No gallops or rubs. Pulm: Good air movement bilaterally. No rales, wheezing, rhonchi or rubs. Abd: Soft, nondistended, nontender, no rebound pain, no organomegaly, BS present. GU: No hematuria Ext: No pitting leg edema bilaterally. 2+DP/PT pulse bilaterally. There is tenderness and swelling over left ankle. Musculoskeletal: No joint deformities, No joint redness or warmth, no limitation of ROM in spin. Skin: No rashes.  Decubitus ulcers Neuro: Alert, oriented X3, cranial nerves II-XII grossly intact, moves all extremities normally. Psych: Patient is not psychotic, no suicidal or hemocidal ideation.  Labs on Admission: I have personally reviewed following labs and imaging studies  CBC:  Recent Labs Lab 10/12/15 1831  WBC 8.7  HGB 13.5  HCT 41.1  MCV 96.9  PLT 99991111   Basic Metabolic Panel:  Recent Labs Lab 10/12/15 1831  NA 141  K 3.1*  CL 104  CO2 25  GLUCOSE 96  BUN 17  CREATININE 0.87  CALCIUM 9.4   GFR: Estimated Creatinine Clearance: 28.8 mL/min (by C-G formula based on Cr of 0.87). Liver Function Tests: No results for input(s): AST, ALT, ALKPHOS, BILITOT, PROT, ALBUMIN in the last 168 hours. No results for input(s): LIPASE, AMYLASE in the last 168 hours. No results for input(s): AMMONIA in the last 168 hours. Coagulation Profile: No results for input(s): INR, PROTIME in the last 168 hours. Cardiac Enzymes: No results for input(s): CKTOTAL, CKMB, CKMBINDEX, TROPONINI in the last 168 hours. BNP (last 3 results) No results for input(s): PROBNP in the last 8760 hours. HbA1C: No results for input(s): HGBA1C in the last 72 hours. CBG: No results for input(s): GLUCAP in the last 168 hours. Lipid Profile: No results for input(s): CHOL, HDL, LDLCALC, TRIG, CHOLHDL, LDLDIRECT in the last 72 hours. Thyroid Function Tests: No results for input(s): TSH, T4TOTAL, FREET4, T3FREE, THYROIDAB in the last 72 hours. Anemia Panel: No results for input(s): VITAMINB12, FOLATE, FERRITIN, TIBC, IRON, RETICCTPCT in the last 72 hours. Urine analysis:    Component Value Date/Time   COLORURINE YELLOW 07/28/2014 0047   APPEARANCEUR CLOUDY* 07/28/2014 0047   LABSPEC 1.010 07/28/2014 0047   PHURINE 8.0 07/28/2014 0047   GLUCOSEU NEGATIVE 07/28/2014 0047   HGBUR NEGATIVE 07/28/2014 0047   BILIRUBINUR NEGATIVE 07/28/2014 0047   KETONESUR NEGATIVE 07/28/2014 0047   PROTEINUR NEGATIVE 07/28/2014 0047     UROBILINOGEN 0.2 07/28/2014 0047   NITRITE NEGATIVE 07/28/2014 0047   LEUKOCYTESUR NEGATIVE 07/28/2014 0047   Sepsis Labs: @LABRCNTIP (procalcitonin:4,lacticidven:4) )No results found for this or any previous visit (from the past 240 hour(s)).   Radiological Exams on Admission: Dg Ankle Complete Left  10/12/2015  CLINICAL DATA:  Inversion injury of the ankle.  Swelling. EXAM: LEFT ANKLE COMPLETE - 3+ VIEW COMPARISON:  None. FINDINGS: Bony demineralization. Transverse fracture of the lateral malleolus proximally. Fractures the distal tibia at the base of the medial malleolus, possibly extending transversely through the distal tibial metaphysis given the poor definition of tibial plafond involvement. IMPRESSION: 1. Transverse fracture of the distal fibula metadiaphysis. Transverse fracture of the distal tibia, extending through the base of the medial malleolus and also probably through the distal tibial metaphysis given the lack of visible extension to the tibial plafond. This is an unusual pattern and possibly related to the underlying bony demineralization. If further clarification of involvement of the distal tibial articular surface is warranted, CT would be recommended. Electronically Signed   By: Van Clines M.D.   On: 10/12/2015 15:22   Dg Hip Unilat With Pelvis 2-3 Views Left  10/12/2015  CLINICAL DATA:  Fall.  Left hip pain.  Previous left hip fracture. EXAM: DG HIP (WITH OR WITHOUT PELVIS) 2-3V LEFT COMPARISON:  07/27/2014 FINDINGS: Compression screw and intramedullary rod are seen within the left hip. No evidence of acute fracture or dislocation. Generalized osteopenia noted as well as extensive peripheral vascular calcification. IMPRESSION: Internal fixation of old left hip fracture.  No acute findings. Electronically Signed   By: Earle Gell M.D.   On: 10/12/2015 20:19     EKG: Not done in ED, will get one.   Assessment/Plan Principal Problem:   Closed left ankle  fracture Active Problems:   Essential hypertension   Coronary atherosclerosis   Atrial flutter (HCC)   PVD   Restless leg   Anxiety   Fall   Closed left ankle fracture: As evidenced by x-ray. Patient has moderate pain now. No neurovascular compromise. Orthopedic surgeon, Dr. Sharol Given was consulted.  - will admit to tele bed - Pain control: Norco - follow up ortho recs - NPO after MN - type and cross - INR/PTT  Essential hypertension:  elevated blood pressure at 222/98 which improved to 170/116 after a dose of clonidine 0.2 mg. -continue home coreg -prn clonidine 0.2 mg tid  Hx of CAD: s/p stent. No CP. -prn nitroglycerin -Continue Coreg and ASA  Hx of A flutter: CHA2DS2-VASc Score is 5, needs oral anticoagulation, but patient is not on AC at home possibly due to high risk of fall. Heart rate is well controlled. -tele monitoring -continue coreg  Anxiety: -Atian  DVT ppx: SCD Code Status: partial code (OK with CPR, but not intubation) Family Communication: None at bed side. Disposition Plan:  Anticipate discharge to rehabilitation facility for short-term rehabilitation Consults called: ortho, dr. Sharol Given Admission status: inpatient [facility]afe   Date of Service 10/12/2015    Ivor Costa [facility] Pager [phone]  If 7PM-7AM, please contact night-coverage [email or web address] Password [facility] 10/12/2015, 9:48 PM

## 2015-10-12 NOTE — ED Notes (Signed)
Pt states she was leaving the bank today and stepped off the stairs and her ankle turned. Pt states she cannot put pressure on it and it appears slightly swollen.

## 2015-10-12 NOTE — Progress Notes (Signed)
Orthopedic Tech Progress Note Patient Details:  Michelle Gaines 1928/09/30 [id number]  Ortho Devices Type of Ortho Device: Ace wrap, Post (short leg) splint Ortho Device/Splint Location: LLE Ortho Device/Splint Interventions: Ordered, Application   Braulio Bosch 10/12/2015, 6:37 PM

## 2015-10-12 NOTE — ED Notes (Signed)
Patient transported to X-ray 

## 2015-10-13 ENCOUNTER — Encounter (HOSPITAL_COMMUNITY): Payer: Self-pay | Admitting: General Practice

## 2015-10-13 ENCOUNTER — Inpatient Hospital Stay (HOSPITAL_BASED_OUTPATIENT_CLINIC_OR_DEPARTMENT_OTHER): Payer: Medicare Other

## 2015-10-13 ENCOUNTER — Observation Stay (HOSPITAL_COMMUNITY): Payer: Medicare Other

## 2015-10-13 ENCOUNTER — Other Ambulatory Visit (HOSPITAL_COMMUNITY): Payer: Self-pay | Admitting: Family

## 2015-10-13 DIAGNOSIS — S82843A Displaced bimalleolar fracture of unspecified lower leg, initial encounter for closed fracture: Secondary | ICD-10-CM

## 2015-10-13 DIAGNOSIS — S82892A Other fracture of left lower leg, initial encounter for closed fracture: Secondary | ICD-10-CM | POA: Diagnosis not present

## 2015-10-13 DIAGNOSIS — S82899A Other fracture of unspecified lower leg, initial encounter for closed fracture: Secondary | ICD-10-CM | POA: Diagnosis present

## 2015-10-13 DIAGNOSIS — I2510 Atherosclerotic heart disease of native coronary artery without angina pectoris: Secondary | ICD-10-CM | POA: Diagnosis not present

## 2015-10-13 DIAGNOSIS — S82832A Other fracture of upper and lower end of left fibula, initial encounter for closed fracture: Secondary | ICD-10-CM | POA: Diagnosis not present

## 2015-10-13 DIAGNOSIS — I739 Peripheral vascular disease, unspecified: Secondary | ICD-10-CM | POA: Diagnosis not present

## 2015-10-13 DIAGNOSIS — S82842A Displaced bimalleolar fracture of left lower leg, initial encounter for closed fracture: Secondary | ICD-10-CM | POA: Diagnosis not present

## 2015-10-13 DIAGNOSIS — I70212 Atherosclerosis of native arteries of extremities with intermittent claudication, left leg: Secondary | ICD-10-CM | POA: Diagnosis not present

## 2015-10-13 DIAGNOSIS — I10 Essential (primary) hypertension: Secondary | ICD-10-CM | POA: Diagnosis not present

## 2015-10-13 DIAGNOSIS — I4892 Unspecified atrial flutter: Secondary | ICD-10-CM | POA: Diagnosis not present

## 2015-10-13 DIAGNOSIS — S82392A Other fracture of lower end of left tibia, initial encounter for closed fracture: Secondary | ICD-10-CM | POA: Diagnosis not present

## 2015-10-13 DIAGNOSIS — F419 Anxiety disorder, unspecified: Secondary | ICD-10-CM | POA: Diagnosis not present

## 2015-10-13 DIAGNOSIS — M810 Age-related osteoporosis without current pathological fracture: Secondary | ICD-10-CM | POA: Diagnosis not present

## 2015-10-13 LAB — CBC
HCT: 34.4 % — ABNORMAL LOW (ref 36.0–46.0)
Hemoglobin: 11 g/dL — ABNORMAL LOW (ref 12.0–15.0)
MCH: 30.8 pg (ref 26.0–34.0)
MCHC: 32 g/dL (ref 30.0–36.0)
MCV: 96.4 fL (ref 78.0–100.0)
PLATELETS: 143 10*3/uL — AB (ref 150–400)
RBC: 3.57 MIL/uL — AB (ref 3.87–5.11)
RDW: 13.2 % (ref 11.5–15.5)
WBC: 7.2 10*3/uL (ref 4.0–10.5)

## 2015-10-13 LAB — BASIC METABOLIC PANEL
Anion gap: 9 (ref 5–15)
BUN: 16 mg/dL (ref 6–20)
CALCIUM: 8.7 mg/dL — AB (ref 8.9–10.3)
CO2: 25 mmol/L (ref 22–32)
CREATININE: 0.89 mg/dL (ref 0.44–1.00)
Chloride: 109 mmol/L (ref 101–111)
GFR, EST NON AFRICAN AMERICAN: 57 mL/min — AB (ref >60)
Glucose, Bld: 111 mg/dL — ABNORMAL HIGH (ref 65–99)
Potassium: 3.6 mmol/L (ref 3.5–5.1)
SODIUM: 143 mmol/L (ref 135–145)

## 2015-10-13 LAB — SURGICAL PCR SCREEN
MRSA, PCR: NEGATIVE
STAPHYLOCOCCUS AUREUS: POSITIVE — AB

## 2015-10-13 LAB — MAGNESIUM: MAGNESIUM: 2 mg/dL (ref 1.7–2.4)

## 2015-10-13 MED ORDER — HEPARIN SODIUM (PORCINE) 5000 UNIT/ML IJ SOLN
5000.0000 [IU] | Freq: Three times a day (TID) | INTRAMUSCULAR | Status: DC
  Administered 2015-10-13 – 2015-10-14 (×3): 5000 [IU] via SUBCUTANEOUS
  Filled 2015-10-13 (×3): qty 1

## 2015-10-13 NOTE — Consult Note (Signed)
ORTHOPAEDIC CONSULTATION  REQUESTING PHYSICIAN: Ripudeep Krystal Eaton, MD  Chief Complaint: Left ankle pain  HPI: Michelle Gaines is a 82 y.o. female who presents with a bimalleolar left ankle fracture with displacement. Patient was admitted due to her inability to ambulate independently as well as for treatment of her underlying hypertension. Patient is evaluated for possible open reduction internal fixation of the bimalleolar left ankle fracture.  Reviewing patient's past medical history she does have peripheral vascular disease she is status post revascularization with Dr. Kellie Simmering and has routine follow-ups with Dr. Kellie Simmering. Her follow-up appointment is in July. Ankle-brachial indices were obtained to evaluate patient's vascular status to see if she is a candidate for internal fixation.  Past Medical History  Diagnosis Date  . Hypertension   . Coronary artery disease   . PVD (peripheral vascular disease) ([place])   . Peripheral arterial disease ([place])   . Dysrhythmia     ATRIAL FLUTTER  . GERD (gastroesophageal reflux disease)   . Cancer ([place])     OVARIAN    1967  . Arthritis   . Atrial flutter ([place])   . Ankle fracture, left 09/2015   Past Surgical History  Procedure Laterality Date  . Bpg    . Pr vein bypass graft,aorto-fem-pop      Right   . Gliayte catherter insertion  06/27/10  . Carotid stent  11/30/09    LAD S/P Right CA DES  . Abdominal hysterectomy    . Atrial flutter ablation N/A 05/22/2013    Procedure: ATRIAL FLUTTER ABLATION;  Surgeon: Evans Lance, MD;  Location: [facility] CATH LAB;  Service: Cardiovascular;  Laterality: N/A;  . Intramedullary (im) nail intertrochanteric Left 07/28/2014    Procedure: Left Affixus Nail;  Surgeon: Marybelle Killings, MD;  Location: [place];  Service: Orthopedics;  Laterality: Left;  . Fracture surgery Left Feb. 2, 2016    Left Hip Fx    Social History   Social History  . Marital Status: Married    Spouse Name: N/A  . Number of Children: N/A  .  Years of Education: N/A   Social History Main Topics  . Smoking status: Never Smoker   . Smokeless tobacco: Never Used  . Alcohol Use: No  . Drug Use: No  . Sexual Activity: Not Asked   Other Topics Concern  . None   Social History Narrative   Family History  Problem Relation Age of Onset  . COPD Father   . Hypertension Father    - negative except otherwise stated in the family history section Allergies  Allergen Reactions  . Fexofenadine Shortness Of Breath  . Cephalosporins Rash  . Diltiazem Other (See Comments)    Dizziness   . Penicillins Nausea And Vomiting, Swelling and Rash  . Amlodipine Other (See Comments)  . Anaprox [Naproxen Sodium] Other (See Comments)  . Benzonatate Other (See Comments)  . Cephalexin Other (See Comments)  . Clindamycin/Lincomycin Other (See Comments)  . Clorazepate Other (See Comments)  . Codeine Other (See Comments)  . Doxycycline Other (See Comments)  . Hydralazine Other (See Comments)  . Labetalol Other (See Comments)  . Lisinopril Other (See Comments)  . Phenylephrine Other (See Comments)  . Phenytoin Other (See Comments)  . Zolpidem Other (See Comments)   Prior to Admission medications   Medication Sig Start Date End Date Taking? Authorizing Provider  BETA CAROTENE PO Take 1 tablet by mouth daily.   Yes Historical Provider, MD  carvedilol (COREG) 6.25  MG tablet Take 1 tablet (6.25 mg total) by mouth 2 (two) times daily with a meal. Okay to take one additional tablet daily as needed SBP > 150. 09/27/15  Yes Evans Lance, MD  cyanocobalamin 100 MCG tablet Take 100 mcg by mouth daily.   Yes Historical Provider, MD  Flaxseed, Linseed, (FLAX SEED OIL PO) Take 1 tablet by mouth daily.   Yes Historical Provider, MD  Homeopathic Products (SLEEP MEDICINE) TABS Take 1 tablet by mouth at bedtime.   Yes Historical Provider, MD  LORazepam (ATIVAN) 0.5 MG tablet Take 0.5 mg by mouth at bedtime.    Yes Historical Provider, MD  Lutein 20 MG TABS  Take 20 mg by mouth daily.   Yes Historical Provider, MD  magnesium gluconate (MAGONATE) 500 MG tablet Take 500 mg by mouth 2 (two) times daily.     Yes Historical Provider, MD  multivitamin [facility]) per tablet Take 1 tablet by mouth daily.     Yes Historical Provider, MD  nitroGLYCERIN (NITROSTAT) 0.4 MG SL tablet Place 1 tablet (0.4 mg total) under the tongue every 5 (five) minutes as needed for chest pain. 05/23/13  Yes Rhonda G Barrett, PA-C  Potassium 99 MG TABS Take 1 tablet by mouth daily.     Yes Historical Provider, MD  Ubiquinol 50 MG CAPS Take 50 mg by mouth 2 (two) times daily.   Yes Historical Provider, MD  VITAMIN D, CHOLECALCIFEROL, PO Take 1,000 mg by mouth daily.    Yes Historical Provider, MD  vitamin E 400 UNIT capsule Take 400 Units by mouth daily.   Yes Historical Provider, MD  aspirin (BAYER ASPIRIN) 325 MG tablet Take 1 tablet (325 mg total) by mouth daily. Patient not taking: Reported on 01/11/2015 07/30/14   Marybelle Killings, MD  docusate sodium (COLACE) 100 MG capsule Take 1 capsule (100 mg total) by mouth daily. Patient not taking: Reported on 01/11/2015 07/30/14   Albertine Patricia, MD  HYDROcodone-acetaminophen (NORCO/VICODIN) 5-325 MG per tablet Take 1-2 tablets by mouth every 6 (six) hours as needed for moderate pain. Patient not taking: Reported on 01/11/2015 07/30/14   Marybelle Killings, MD   Dg Ankle Complete Left  10/12/2015  CLINICAL DATA:  Inversion injury of the ankle.  Swelling. EXAM: LEFT ANKLE COMPLETE - 3+ VIEW COMPARISON:  None. FINDINGS: Bony demineralization. Transverse fracture of the lateral malleolus proximally. Fractures the distal tibia at the base of the medial malleolus, possibly extending transversely through the distal tibial metaphysis given the poor definition of tibial plafond involvement. IMPRESSION: 1. Transverse fracture of the distal fibula metadiaphysis. Transverse fracture of the distal tibia, extending through the base of the medial malleolus and also  probably through the distal tibial metaphysis given the lack of visible extension to the tibial plafond. This is an unusual pattern and possibly related to the underlying bony demineralization. If further clarification of involvement of the distal tibial articular surface is warranted, CT would be recommended. Electronically Signed   By: Van Clines M.D.   On: 10/12/2015 15:22   Dg Hip Unilat With Pelvis 2-3 Views Left  10/12/2015  CLINICAL DATA:  Fall.  Left hip pain.  Previous left hip fracture. EXAM: DG HIP (WITH OR WITHOUT PELVIS) 2-3V LEFT COMPARISON:  07/27/2014 FINDINGS: Compression screw and intramedullary rod are seen within the left hip. No evidence of acute fracture or dislocation. Generalized osteopenia noted as well as extensive peripheral vascular calcification. IMPRESSION: Internal fixation of old left hip fracture.  No  acute findings. Electronically Signed   By: Earle Gell M.D.   On: 10/12/2015 20:19   - pertinent xrays, CT, MRI studies were reviewed and independently interpreted  Positive ROS: All other systems have been reviewed and were otherwise negative with the exception of those mentioned in the HPI and as above.  Physical Exam: General: Alert, no acute distress Cardiovascular: No pedal edema Respiratory: No cyanosis, no use of accessory musculature GI: No organomegaly, abdomen is soft and non-tender Skin: No lesions in the area of chief complaint Neurologic: Sensation intact distally Psychiatric: Patient is competent for consent with normal mood and affect Lymphatic: No axillary or cervical lymphadenopathy  MUSCULOSKELETAL:   On examination patient has slow capillary refill to the left foot. She does not have palpable pulses. Ankle-brachial indices were obtained which showed dampen monophasic pulses worse on the left than the right with an ankle-brachial indices of approximately 49%. Patient has diminished circulation from her previous studies. Radiographs are  reviewed which shows a displaced bimalleolar fracture with osteoporosis  Assessment: Assessment: Bimalleolar left ankle fracture with osteoporosis with severe peripheral vascular disease with insufficient circulation to safely healed surgical incision.  Plan:  Plan: We will plan for closed reduction. I do not feel that it safe to proceed with internal fixation due to patient's severe peripheral vascular disease. She has no ischemic ulcers and I do not feel that any urgent intervention is necessary at this time. Her circulation is only slightly diminished from previous studies. After informed consent patient underwent closed reduction and was placed in a short leg cast with her foot supinated to restore the mortise. Postoperative radiographs were obtained. Orders are written for touchdown weight-bearing patient expressed an interest to be discharged to home tomorrow after therapy I will place orders for home health therapy as well. I'll follow-up in the office in 2 weeks.   Thank you for the consult and the opportunity to see Ms. Ubaldo Glassing, MD [place] [phone] 6:02 PM

## 2015-10-13 NOTE — Care Management Obs Status (Signed)
[place] NOTIFICATION   Patient Details  Name: Michelle Gaines MRN: [id number] Date of Birth: 03-10-1929   Medicare Observation Status Notification Given:  Yes (Code 76)    Ninfa Meeker, RN 10/13/2015, 4:54 PM

## 2015-10-13 NOTE — Progress Notes (Signed)
[facility]                                                                              Patient Demographics  Michelle Gaines, is a 82 y.o. female, DOB - 1929-05-07, [id number]  Admit date - 10/12/2015   Admitting Physician Ivor Costa, MD  Outpatient Primary MD for the patient is GREEN, Michelle Bachelor, MD  Outpatient specialists:   LOS - 1  days    Chief Complaint  Patient presents with  . Fall       Brief HPI   Patient is a 82 year old female with GERD, anxiety, CAD, PAD atrial fibrillation not on anticoagulation admitted with left ankle pain, after fall. X-ray of the left ankle shows transverse fracture of the distal fibula extending through the base of the medial malleolus and distal tibial metaphysis. Orthopedics consulted.   Assessment & Plan    Closed left ankle fracture:  - Orthopedics consulted, seen by Dr. Sharol Given - Plan for ORIF on 4/21 - NPO after midnight -Continue pain control  Essential hypertension: elevated blood pressure at 222/98 which improved to 170/116 after a dose of clonidine 0.2 mg. -continue home coreg -prn clonidine 0.2 mg tid  Hx of CAD: s/p stent. No CP. -prn nitroglycerin -Continue Coreg and ASA  Hx of A flutter: CHA2DS2-VASc Score is 5, needs oral anticoagulation, but patient is not on AC at home due to high risk of fall.  -tele monitoring -continue coreg  Anxiety: -Continue Ativan at bedtime  Code Status: Partial code  Family Communication: Discussed in detail with the patient, all imaging results, lab results explained to the patient and patient's son at the bedside   Disposition Plan:   Time Spent in minutes  25 minutes  Procedures  None  Consults   Ortho   DVT Prophylaxis  Heparin sq  Medications  Scheduled Meds: . carvedilol  6.25 mg Oral BID WC  . cholecalciferol  1,000 Units Oral Daily  . LORazepam  0.5 mg Oral QHS  . magnesium oxide  400 mg Oral BID  . multivitamin with minerals  1 tablet Oral  Daily  . sodium chloride flush  3 mL Intravenous Q12H  . cyanocobalamin  100 mcg Oral Daily  . vitamin E  400 Units Oral Daily   Continuous Infusions: . sodium chloride 75 mL/hr at 10/12/15 2216   PRN Meds:.cloNIDine, HYDROcodone-acetaminophen, nitroGLYCERIN, ondansetron (ZOFRAN) IV   Antibiotics   Anti-infectives    None        Subjective:   [name] was seen and examined today. No significant complaints, pain is controlled. Patient denies dizziness, chest pain, shortness of breath, abdominal pain, N/V/D/C, new weakness, numbess, tingling. No acute events overnight.    Objective:   Filed Vitals:   10/12/15 2030 10/12/15 2115 10/12/15 2202 10/13/15 0601  BP: 214/86 218/90 177/90 157/68  Pulse: 65 40 62 54  Temp:   98.1 F (36.7 C) 98.4 F (36.9 C)  TempSrc:   Oral Oral  Resp:   18 16  Height:      Weight:      SpO2: 100% 100% 100% 98%    Intake/Output  Summary (Last 24 hours) at 10/13/15 1108 Last data filed at 10/13/15 0655  Gross per 24 hour  Intake    675 ml  Output    150 ml  Net    525 ml     Wt Readings from Last 3 Encounters:  10/12/15 38.556 kg (85 lb)  09/27/15 37.649 kg (83 lb)  01/11/15 37.739 kg (83 lb 3.2 oz)     Exam  General: Alert and oriented x 3, NAD  HEENT:  PERRLA, EOMI, Anicteric Sclera, mucous membranes moist.   Neck: Supple, no JVD  CVS: S1 S2 auscultated, no rubs, murmurs or gallops. Regular rate and rhythm.  Respiratory: Clear to auscultation bilaterally, no wheezing, rales or rhonchi  Abdomen: Soft, nontender, nondistended, + bowel sounds  Ext: no cyanosis clubbing or edema  Neuro:   Skin: No rashes  Psych: Normal affect and demeanor, alert and oriented x3    Data Reviewed:  I have personally reviewed following labs and imaging studies  Micro Results Recent Results (from the past 240 hour(s))  Surgical pcr screen     Status: Abnormal   Collection Time: 10/13/15  6:06 AM  Result Value Ref Range Status     MRSA, PCR NEGATIVE NEGATIVE Final   Staphylococcus aureus POSITIVE (A) NEGATIVE Final    Comment:        The Xpert SA Assay (FDA approved for NASAL specimens in patients over 39 years of age), is one component of a comprehensive surveillance program.  Test performance has been validated by [facility] for patients greater than or equal to 23 year old. It is not intended to diagnose infection nor to guide or monitor treatment.     Radiology Reports Dg Ankle Complete Left  10/12/2015  CLINICAL DATA:  Inversion injury of the ankle.  Swelling. EXAM: LEFT ANKLE COMPLETE - 3+ VIEW COMPARISON:  None. FINDINGS: Bony demineralization. Transverse fracture of the lateral malleolus proximally. Fractures the distal tibia at the base of the medial malleolus, possibly extending transversely through the distal tibial metaphysis given the poor definition of tibial plafond involvement. IMPRESSION: 1. Transverse fracture of the distal fibula metadiaphysis. Transverse fracture of the distal tibia, extending through the base of the medial malleolus and also probably through the distal tibial metaphysis given the lack of visible extension to the tibial plafond. This is an unusual pattern and possibly related to the underlying bony demineralization. If further clarification of involvement of the distal tibial articular surface is warranted, CT would be recommended. Electronically Signed   By: Van Clines M.D.   On: 10/12/2015 15:22   Dg Hip Unilat With Pelvis 2-3 Views Left  10/12/2015  CLINICAL DATA:  Fall.  Left hip pain.  Previous left hip fracture. EXAM: DG HIP (WITH OR WITHOUT PELVIS) 2-3V LEFT COMPARISON:  07/27/2014 FINDINGS: Compression screw and intramedullary rod are seen within the left hip. No evidence of acute fracture or dislocation. Generalized osteopenia noted as well as extensive peripheral vascular calcification. IMPRESSION: Internal fixation of old left hip fracture.  No acute findings.  Electronically Signed   By: Earle Gell M.D.   On: 10/12/2015 20:19    CBC  Recent Labs Lab 10/12/15 1831 10/13/15 0451  WBC 8.7 7.2  HGB 13.5 11.0*  HCT 41.1 34.4*  PLT 155 143*  MCV 96.9 96.4  MCH 31.8 30.8  MCHC 32.8 32.0  RDW 13.2 13.2    Chemistries   Recent Labs Lab 10/12/15 1831 10/13/15 0451  NA 141 143  K  3.1* 3.6  CL 104 109  CO2 25 25  GLUCOSE 96 111*  BUN 17 16  CREATININE 0.87 0.89  CALCIUM 9.4 8.7*  MG  --  2.0   ------------------------------------------------------------------------------------------------------------------ estimated creatinine clearance is 28.2 mL/min (by C-G formula based on Cr of 0.89). ------------------------------------------------------------------------------------------------------------------ No results for input(s): HGBA1C in the last 72 hours. ------------------------------------------------------------------------------------------------------------------ No results for input(s): CHOL, HDL, LDLCALC, TRIG, CHOLHDL, LDLDIRECT in the last 72 hours. ------------------------------------------------------------------------------------------------------------------ No results for input(s): TSH, T4TOTAL, T3FREE, THYROIDAB in the last 72 hours.  Invalid input(s): FREET3 ------------------------------------------------------------------------------------------------------------------ No results for input(s): VITAMINB12, FOLATE, FERRITIN, TIBC, IRON, RETICCTPCT in the last 72 hours.  Coagulation profile  Recent Labs Lab 10/12/15 2159  INR 1.13    No results for input(s): DDIMER in the last 72 hours.  Cardiac Enzymes No results for input(s): CKMB, TROPONINI, MYOGLOBIN in the last 168 hours.  Invalid input(s): CK ------------------------------------------------------------------------------------------------------------------ Invalid input(s): POCBNP  No results for input(s): GLUCAP in the last 72  hours.   Kla Bily M.D. [facility] 10/13/2015, 11:08 AM  Pager: [phone] Between 7am to 7pm - call Pager - [phone]  After 7pm go to [email or web address] - password [email or web address]  Call night coverage person covering after 7pm

## 2015-10-13 NOTE — Evaluation (Signed)
Physical Therapy Evaluation Patient Details Name: Michelle Gaines MRN: [id number] DOB: [birth year] Today's Date: 10/13/2015   History of Present Illness  82 y.o. female with medical history significant of hypertension, GERD, anxiety, CAD (s/p stent), PAD (s/p of aortofemoral popliteal bypass on the right), overt cancer, atrial fibrillation not on anticoagulants possibly due to high risk of fall, arthritis, hip fracture, who presents with left ankle pain after fall. Plan for ORIF on 4/21.  Clinical Impression  Pt admitted with above diagnosis. Pt currently with functional limitations due to the deficits listed below (see PT Problem List). At the time of PT eval pt was able to perform transfers with supervision to min guard assist. Pt was able to maintain NWB status well on the LLE throughout functional mobility. Pt will benefit from skilled PT to increase their independence and safety with mobility to allow discharge to the venue listed below.       Follow Up Recommendations Home health PT;Supervision for mobility/OOB    Equipment Recommendations  Wheelchair (measurements PT);Wheelchair cushion (measurements PT) (If NWB after surgery)    Recommendations for Other Services       Precautions / Restrictions Precautions Precautions: Fall Restrictions Weight Bearing Restrictions: Yes LLE Weight Bearing: Non weight bearing      Mobility  Bed Mobility Overal bed mobility: Needs Assistance Bed Mobility: Supine to Sit     Supine to sit: Supervision;HOB elevated     General bed mobility comments: Supervision for safety. HOB slightly elevated with use of bed rails. VCs for hand placement and technique.   Transfers Overall transfer level: Needs assistance Equipment used: Rolling walker (2 wheeled) Transfers: Sit to/from Stand Sit to Stand: Min guard Stand pivot transfers: Min guard       General transfer comment: Min guard for safety. Pt able to maintain NWB on LLE throughout  transfer. VCs for hand placement, sequencing, and safety.  Ambulation/Gait                Stairs            Wheelchair Mobility    Modified Rankin (Stroke Patients Only)       Balance Overall balance assessment: Needs assistance Sitting-balance support: Feet supported;No upper extremity supported Sitting balance-Leahy Scale: Fair     Standing balance support: Bilateral upper extremity supported;During functional activity Standing balance-Leahy Scale: Poor Standing balance comment: RW for support                             Pertinent Vitals/Pain Pain Assessment: 0-10 Pain Score: 4  Pain Location: L ankle Pain Descriptors / Indicators: Sore;Aching Pain Intervention(s): Limited activity within patient's tolerance;Monitored during session;Repositioned    Home Living Family/patient expects to be discharged to:: Private residence Living Arrangements: Spouse/significant other Available Help at Discharge: Family;Available 24 hours/day Type of Home: House Home Access: Stairs to enter   [employer] of Steps: 1 Home Layout: One level Home Equipment: Walker - 2 wheels;Cane - single point;Crutches;Shower seat      Prior Function Level of Independence: Independent               Hand Dominance        Extremity/Trunk Assessment   Upper Extremity Assessment: Defer to OT evaluation           Lower Extremity Assessment: LLE deficits/detail   LLE Deficits / Details: Acute pain and decreased AROM consistent with ankle fracture  Cervical / Trunk Assessment: Kyphotic  Communication   Communication: No difficulties  Cognition Arousal/Alertness: Awake/alert Behavior During Therapy: WFL for tasks assessed/performed Overall Cognitive Status: Within Functional Limits for tasks assessed                      General Comments      Exercises        Assessment/Plan    PT Assessment Patient needs continued PT services   PT Diagnosis Difficulty walking;Acute pain   PT Problem List Decreased strength;Decreased range of motion;Decreased activity tolerance;Decreased balance;Decreased mobility;Decreased knowledge of use of DME;Decreased safety awareness;Decreased knowledge of precautions;Pain  PT Treatment Interventions DME instruction;Gait training;Stair training;Functional mobility training;Therapeutic activities;Therapeutic exercise;Neuromuscular re-education;Patient/family education   PT Goals (Current goals can be found in the Care Plan section) Acute Rehab PT Goals Patient Stated Goal: eat/drink something PT Goal Formulation: With patient/family Time For Goal Achievement: 10/20/15 Potential to Achieve Goals: Good    Frequency Min 5X/week   Barriers to discharge        Co-evaluation               End of Session   Activity Tolerance: Patient tolerated treatment well Patient left: in chair;with call bell/phone within reach;with chair alarm set;with family/visitor present Nurse Communication: Mobility status         Time: [id number] PT Time Calculation (min) (ACUTE ONLY): 22 min   Charges:   PT Evaluation $PT Eval Moderate Complexity: 1 Procedure     PT G Codes:        Michelle Gaines 11-10-15, 12:25 PM  Michelle Gaines, PT, DPT Acute Rehabilitation Services Pager: [phone]

## 2015-10-13 NOTE — Evaluation (Signed)
Occupational Therapy Evaluation Patient Details Name: Michelle Gaines MRN: [id number] DOB: 1929/02/17 Today's Date: 10/13/2015    History of Present Illness 82 y.o. female with medical history significant of hypertension, GERD, anxiety, CAD (s/p stent), PAD (s/p of aortofemoral popliteal bypass on the right), overt cancer, atrial fibrillation not on anticoagulants possibly due to high risk of fall, arthritis, hip fracture, who presents with left ankle pain after fall. Plan for ORIF on 4/21.   Clinical Impression   Pt reports she was independent with ADLs PTA. Currently pt overall min guard for safety with stand pivot transfers and ADLs with the exception of min assist for LB ADLs. Pt able to maintain NWB on LLE throughout functional mobility during session. Pt planning to d/c home with 24/7 supervision from her husband. Recommending HHOT for follow up in order to maximize independence and safety with ADLs and functional mobility upon return home. Pt would benefit from continued skilled OT to address established goals.     Follow Up Recommendations  Home health OT;Supervision/Assistance - 24 hour    Equipment Recommendations  3 in 1 bedside comode    Recommendations for Other Services       Precautions / Restrictions Precautions Precautions: Fall Restrictions Weight Bearing Restrictions: Yes LLE Weight Bearing: Non weight bearing      Mobility Bed Mobility Overal bed mobility: Needs Assistance Bed Mobility: Supine to Sit     Supine to sit: Supervision;HOB elevated     General bed mobility comments: Supervision for safety. HOB slightly elevated with use of bed rails. VCs for hand placement and technique.   Transfers Overall transfer level: Needs assistance Equipment used: Rolling walker (2 wheeled) Transfers: Sit to/from [employer] Sit to Stand: Min guard Stand pivot transfers: Min guard       General transfer comment: Min guard for safety. Pt able  to maintain NWB on LLE throughout transfer. VCs for hand placement, sequencing, and safety.    Balance Overall balance assessment: Needs assistance Sitting-balance support: Feet supported;No upper extremity supported Sitting balance-Leahy Scale: Fair     Standing balance support: Bilateral upper extremity supported Standing balance-Leahy Scale: Poor Standing balance comment: RW for support                            ADL Overall ADL's : Needs assistance/impaired Eating/Feeding: Set up;Sitting   Grooming: Set up;Sitting   Upper Body Bathing: Set up;Sitting   Lower Body Bathing: Minimal assistance;Sit to/from stand   Upper Body Dressing : Set up;Sitting   Lower Body Dressing: Minimal assistance;Sit to/from stand Lower Body Dressing Details (indicate cue type and reason): Pt able to don R sock with difficulty; min assist provided for no WB through LLE sitting EOB. Toilet Transfer: Min Control and instrumentation engineer and Hygiene: Min guard;Sit to/from stand       Functional mobility during ADLs: Min guard;Rolling walker;Cueing for safety (for stand pivot) General ADL Comments: Educated pt on maintaining NWB status during functional mobility.      Vision Vision Assessment?: No apparent visual deficits   Perception     Praxis      Pertinent Vitals/Pain Pain Assessment: 0-10 Pain Score: 4  Pain Location: L ankle Pain Descriptors / Indicators: Aching;Constant Pain Intervention(s): Limited activity within patient's tolerance;Monitored during session;Repositioned;Ice applied     Hand Dominance     Extremity/Trunk Assessment Upper Extremity Assessment Upper Extremity Assessment: Overall WFL for tasks assessed   Lower Extremity  Assessment Lower Extremity Assessment: Defer to PT evaluation   Cervical / Trunk Assessment Cervical / Trunk Assessment: Normal   Communication Communication Communication: No difficulties   Cognition  Arousal/Alertness: Awake/alert Behavior During Therapy: WFL for tasks assessed/performed Overall Cognitive Status: Within Functional Limits for tasks assessed                     General Comments       Exercises       Shoulder Instructions      Home Living Family/patient expects to be discharged to:: Private residence Living Arrangements: Spouse/significant other Available Help at Discharge: Family;Available 24 hours/day Type of Home: House Home Access: Stairs to enter [employer] of Steps: 1   Home Layout: One level     Bathroom Shower/Tub: Occupational psychologist: Standard     Home Equipment: Environmental consultant - 2 wheels;Cane - single point;Crutches;Shower seat          Prior Functioning/Environment Level of Independence: Independent             OT Diagnosis: Generalized weakness;Acute pain   OT Problem List: Decreased strength;Impaired balance (sitting and/or standing);Decreased safety awareness;Decreased knowledge of use of DME or AE;Decreased knowledge of precautions;Pain   OT Treatment/Interventions: Self-care/ADL training;Energy conservation;DME and/or AE instruction;Therapeutic activities;Patient/family education;Balance training    OT Goals(Current goals can be found in the care plan section) Acute Rehab OT Goals Patient Stated Goal: eat/drink something OT Goal Formulation: With patient Time For Goal Achievement: 10/27/15 Potential to Achieve Goals: Good ADL Goals Pt Will Perform Lower Body Bathing: with supervision;sit to/from stand (with or without AE) Pt Will Perform Lower Body Dressing: with supervision;sit to/from stand (with or without AE) Pt Will Transfer to Toilet: with supervision;ambulating;bedside commode (BSC over toilet) Pt Will Perform Toileting - Clothing Manipulation and hygiene: with supervision;sit to/from stand Pt Will Perform Tub/Shower Transfer: Shower transfer;with supervision;ambulating;shower seat;rolling  walker  OT Frequency: Min 2X/week   Barriers to D/C:            Co-evaluation              End of Session Equipment Utilized During Treatment: Gait belt;Rolling walker  Activity Tolerance: Patient tolerated treatment well Patient left: in chair;with call bell/phone within reach;with chair alarm set;with family/visitor present   Time: [id number] OT Time Calculation (min): 22 min Charges:  OT General Charges $OT Visit: 1 Procedure OT Evaluation $OT Eval Moderate Complexity: 1 Procedure G-Codes:     Binnie Kand M.S., OTR/L Pager: [phone]  10/13/2015, 11:26 AM

## 2015-10-13 NOTE — Progress Notes (Signed)
VASCULAR LAB PRELIMINARY  ARTERIAL  ABI completed:    RIGHT    LEFT    PRESSURE WAVEFORM  PRESSURE WAVEFORM  BRACHIAL 150 Triphasic BRACHIAL 145 Triphasic  DP 79 Monophasic DP 74 Dampened Monophasic  PT 120 Monophasic PT Unable to obtain due to soft casting for fracture ankle   GREAT TOE 99 DM GREAT TOE 30 SDM    RIGHT LEFT  ABI / TBI 0.80 / 0.66 0.49 / 0.20   Right - ABI indicate a mild reduction in arterial flow in the posterior tibial artery and a moderate reduction in the dorsalis pedis artery. Doppler waveforms are abnormal.  Left - ABI indicates a severe reduction in arterial flow in the dorsalis pedis with abnormal Doppler waveforms. ABIs however appear unchanged from study of 12/29/13. Posterior tibial ABI could not be obtained due to soft casting of the fracture in the ankle. Great toe pressures are abnormal bilaterally with the left being worse than the right.   Preliminary report called to Dr. Meridee Score.  10/13/15 2:00 PM    Michelle Gaines, RVS 10/13/2015, 2:10 PM

## 2015-10-13 NOTE — Progress Notes (Signed)
Orthopedic Tech Progress Note Patient Details:  Michelle Gaines 06/20/1929 [id number]  Casting Type of Cast: Short leg cast Cast Location: LLE Cast Material: Fiberglass Cast Intervention: Application     Braulio Bosch 10/13/2015, 6:22 PM

## 2015-10-13 NOTE — Progress Notes (Signed)
Orthopedic Tech Progress Note Patient Details:  Michelle Gaines 02/21/1929 [id number]  Ortho Devices Type of Ortho Device: Ace wrap, Post (short leg) splint Ortho Device/Splint Location: LLE Ortho Device/Splint Interventions: Ordered, Application   Braulio Bosch 10/13/2015, 6:22 PM

## 2015-10-13 NOTE — Progress Notes (Signed)
Patient ID: Michelle Gaines, female   DOB: 1929/01/23, 82 y.o.   MRN: [id number] Plan for ORIF left ankle Friday.  ABI ordered due to patients history of PVD.  I will see patient this evening.

## 2015-10-14 ENCOUNTER — Encounter (HOSPITAL_COMMUNITY)
Admission: EM | Disposition: A | Discharge status: Home Health | Payer: Self-pay | Source: Home / Self Care | Attending: Emergency Medicine

## 2015-10-14 DIAGNOSIS — S82892A Other fracture of left lower leg, initial encounter for closed fracture: Secondary | ICD-10-CM

## 2015-10-14 LAB — CBC
HEMATOCRIT: 33.5 % — AB (ref 36.0–46.0)
Hemoglobin: 10.5 g/dL — ABNORMAL LOW (ref 12.0–15.0)
MCH: 30.5 pg (ref 26.0–34.0)
MCHC: 31.3 g/dL (ref 30.0–36.0)
MCV: 97.4 fL (ref 78.0–100.0)
Platelets: 131 10*3/uL — ABNORMAL LOW (ref 150–400)
RBC: 3.44 MIL/uL — ABNORMAL LOW (ref 3.87–5.11)
RDW: 13.4 % (ref 11.5–15.5)
WBC: 5.9 10*3/uL (ref 4.0–10.5)

## 2015-10-14 LAB — BASIC METABOLIC PANEL
ANION GAP: 9 (ref 5–15)
BUN: 18 mg/dL (ref 6–20)
CALCIUM: 8.6 mg/dL — AB (ref 8.9–10.3)
CO2: 26 mmol/L (ref 22–32)
Chloride: 105 mmol/L (ref 101–111)
Creatinine, Ser: 0.85 mg/dL (ref 0.44–1.00)
GFR calc Af Amer: >60 mL/min (ref >60)
GFR calc non Af Amer: >60 mL/min (ref >60)
GLUCOSE: 100 mg/dL — AB (ref 65–99)
POTASSIUM: 3.6 mmol/L (ref 3.5–5.1)
Sodium: 140 mmol/L (ref 135–145)

## 2015-10-14 SURGERY — OPEN REDUCTION INTERNAL FIXATION (ORIF) ANKLE FRACTURE
Anesthesia: General | Laterality: Left

## 2015-10-14 MED ORDER — HYDROCODONE-ACETAMINOPHEN 5-325 MG PO TABS: 1.0000 | ORAL_TABLET | Freq: Four times a day (QID) | ORAL | Status: DC | PRN

## 2015-10-14 NOTE — Care Management Note (Signed)
Case Management Note  Patient Details  Name: Michelle Gaines MRN: [id number] Date of Birth: Jul 18, 1928  Subjective/Objective:  82 yr old female s/p left ankle fracture. Patient will not undergo surgery due to severe PVD. Ankle was casted.            Action/Plan: Case manager spoke with patient and husband concerning home health and DME needs. Choice was offered, patient states she wants [name]. Case manager called referral to Estrella Myrtle, [facility]. Patient will have family support at discharge.    Expected Discharge Date:   10/14/15               Expected Discharge Plan:  [place]  In-House Referral:     Discharge planning Services  CM Consult  Post Acute Care Choice:  Home Health Choice offered to:  Patient  DME Arranged:  3-N-1 DME Agency:  NA  HH Arranged:  PT, OT [place] Agency:  [place]  Status of Service:  Completed, signed off  Medicare Important Message Given:    Date Medicare IM Given:    Medicare IM give by:    Date Additional Medicare IM Given:    Additional Medicare Important Message give by:     If discussed at [place] of Stay Meetings, dates discussed:    Additional Comments:  Ninfa Meeker, RN 10/14/2015, 11:53 AM

## 2015-10-14 NOTE — Discharge Summary (Signed)
Physician Discharge Summary  Michelle Gaines [id number] DOB: [birth year] DOA: 10/12/2015  PCP: Criselda Peaches, MD  Admit date: 10/12/2015 Discharge date: 10/14/2015  Time spent: 35 minutes  Recommendations for Outpatient Follow-up:  1. Follow-up with primary care physician in one week 2. Follow-up with orthopedic surgery Dr.Duda in 1 week   Discharge Diagnoses:  Principal Problem:   Closed left ankle fracture Active Problems:   Essential hypertension   Coronary atherosclerosis   Atrial flutter (HCC)   PVD   Restless leg   Anxiety   Fall   Ankle fracture   Discharge Condition: stable  Diet recommendation: regular  Filed Weights   10/12/15 1453  Weight: 38.556 kg (85 lb)    History of present illness:  Patient is a 82 year old female with GERD, anxiety, CAD, PAD atrial fibrillation not on anticoagulation admitted with left ankle pain, after fall. X-ray of the left ankle shows transverse fracture of the distal fibula extending through the base of the medial malleolus and distal tibial metaphysis. Marland Kitchen Hospital Course:  Closed left ankle fracture: - Orthopedics consulted, seen by Dr. Sharol Given. Considering patient's history of peripheral vascular disease the decision treat conservatively by  Placing a  Cast. Patient is discharged with home health for PT. Patient will need to follow-up as an outpatient with orthopedic surgery  Essential hypertension: elevated blood pressure at 222/98 which improved to 170/116 after a dose of clonidine 0.2 mg. Continue home coreg. Well-controlled.  Hx of CAD: s/p stent. No CP. -prn nitroglycerin -Continue Coreg and ASA  Hx of A flutter: CHA2DS2-VASc Score is 5, needs oral anticoagulation, but patient is not on AC at home due to high risk of fall.  continued coreg  Anxiety: -Continue Ativan at bedtime Consultations: Orthopedic surgery Discharge Exam: Filed Vitals:   10/13/15 1900 10/14/15 0500  BP: 176/68 172/64  Pulse: 64 66  Temp:  97.9 F (36.6 C) 97.6 F (36.4 C)  Resp: 18 18    General: well conscious oriented Cardiovascular: S1-S2 regular Respiratory: bilateral clear to auscultation  Discharge Instructions   Discharge Instructions    Elevate operative extremity    Complete by:  As directed      Touch down weight bearing    Complete by:  As directed   Laterality:  left  Extremity:  Lower          Current Discharge Medication List    CONTINUE these medications which have CHANGED   Details  HYDROcodone-acetaminophen (NORCO/VICODIN) 5-325 MG tablet Take 1-2 tablets by mouth every 6 (six) hours as needed for moderate pain or severe pain. Qty: 30 tablet, Refills: 0      CONTINUE these medications which have NOT CHANGED   Details  BETA CAROTENE PO Take 1 tablet by mouth daily.    carvedilol (COREG) 6.25 MG tablet Take 1 tablet (6.25 mg total) by mouth 2 (two) times daily with a meal. Okay to take one additional tablet daily as needed SBP > 150. Qty: 180 tablet, Refills: 3   Associated Diagnoses: Atrial flutter, unspecified; Hip fracture, left, sequela    cyanocobalamin 100 MCG tablet Take 100 mcg by mouth daily.    Flaxseed, Linseed, (FLAX SEED OIL PO) Take 1 tablet by mouth daily.    Homeopathic Products (SLEEP MEDICINE) TABS Take 1 tablet by mouth at bedtime.    LORazepam (ATIVAN) 0.5 MG tablet Take 0.5 mg by mouth at bedtime.     Lutein 20 MG TABS Take 20 mg by mouth daily.  magnesium gluconate (MAGONATE) 500 MG tablet Take 500 mg by mouth 2 (two) times daily.      multivitamin (THERAGRAN) per tablet Take 1 tablet by mouth daily.      nitroGLYCERIN (NITROSTAT) 0.4 MG SL tablet Place 1 tablet (0.4 mg total) under the tongue every 5 (five) minutes as needed for chest pain. Qty: 25 tablet, Refills: 12    Potassium 99 MG TABS Take 1 tablet by mouth daily.      Ubiquinol 50 MG CAPS Take 50 mg by mouth 2 (two) times daily.    VITAMIN D, CHOLECALCIFEROL, PO Take 1,000 mg by mouth daily.      vitamin E 400 UNIT capsule Take 400 Units by mouth daily.    aspirin (BAYER ASPIRIN) 325 MG tablet Take 1 tablet (325 mg total) by mouth daily. Qty: 28 tablet, Refills: 0    docusate sodium (COLACE) 100 MG capsule Take 1 capsule (100 mg total) by mouth daily. Qty: 30 capsule, Refills: 0       Allergies  Allergen Reactions  . Fexofenadine Shortness Of Breath  . Cephalosporins Rash  . Diltiazem Other (See Comments)    Dizziness   . Penicillins Nausea And Vomiting, Swelling and Rash  . Amlodipine Other (See Comments)  . Anaprox [Naproxen Sodium] Other (See Comments)  . Benzonatate Other (See Comments)  . Cephalexin Other (See Comments)  . Clindamycin/Lincomycin Other (See Comments)  . Clorazepate Other (See Comments)  . Codeine Other (See Comments)  . Doxycycline Other (See Comments)  . Hydralazine Other (See Comments)  . Labetalol Other (See Comments)  . Lisinopril Other (See Comments)  . Phenylephrine Other (See Comments)  . Phenytoin Other (See Comments)  . Zolpidem Other (See Comments)   Follow-up Information    Follow up with DUDA,MARCUS V, MD In 1 week.   Specialty:  Orthopedic Surgery   Contact information:   [place] [address] [phone]       Follow up with GREEN, [name], MD. Schedule an appointment as soon as possible for a visit in 1 week.   Specialty:  Internal Medicine   Contact information:   [address] [place] [address] [phone]       Follow up with [facility].   Why:  Someone from [facility] will contact you concerning start date and time for therapy   Contact information:   [address] [place] [address] [phone]        The results of significant diagnostics from this hospitalization (including imaging, microbiology, ancillary and laboratory) are listed below for reference.    Significant Diagnostic Studies: Dg Ankle Complete  Left  10/13/2015  CLINICAL DATA:  Ankle fracture, bimalleolar, closed, post close reduction EXAM: LEFT ANKLE COMPLETE - 3+ VIEW COMPARISON:  10/12/2015 FINDINGS: Fiberglass cast material obscures bone detail. Again identified transverse mildly distracted fracture of the distal fibula. Again identified displaced fracture of distal tibial metaphysis. Ankle joint space appears slightly narrowed. Mild apex lateral angulation at both fractures. IMPRESSION: Distal LEFT fibular and tibial fractures appear unchanged from the previous exam. Electronically Signed   By: Lavonia Dana M.D.   On: 10/13/2015 21:27   Dg Ankle Complete Left  10/12/2015  CLINICAL DATA:  Inversion injury of the ankle.  Swelling. EXAM: LEFT ANKLE COMPLETE - 3+ VIEW COMPARISON:  None. FINDINGS: Bony demineralization. Transverse fracture of the lateral malleolus proximally. Fractures the distal tibia at the base of the medial  malleolus, possibly extending transversely through the distal tibial metaphysis given the poor definition of tibial plafond involvement. IMPRESSION: 1. Transverse fracture of the distal fibula metadiaphysis. Transverse fracture of the distal tibia, extending through the base of the medial malleolus and also probably through the distal tibial metaphysis given the lack of visible extension to the tibial plafond. This is an unusual pattern and possibly related to the underlying bony demineralization. If further clarification of involvement of the distal tibial articular surface is warranted, CT would be recommended. Electronically Signed   By: Van Clines M.D.   On: 10/12/2015 15:22   Dg Hip Unilat With Pelvis 2-3 Views Left  10/12/2015  CLINICAL DATA:  Fall.  Left hip pain.  Previous left hip fracture. EXAM: DG HIP (WITH OR WITHOUT PELVIS) 2-3V LEFT COMPARISON:  07/27/2014 FINDINGS: Compression screw and intramedullary rod are seen within the left hip. No evidence of acute fracture or dislocation. Generalized osteopenia  noted as well as extensive peripheral vascular calcification. IMPRESSION: Internal fixation of old left hip fracture.  No acute findings. Electronically Signed   By: Earle Gell M.D.   On: 10/12/2015 20:19    Microbiology: Recent Results (from the past 240 hour(s))  Surgical pcr screen     Status: Abnormal   Collection Time: 10/13/15  6:06 AM  Result Value Ref Range Status   MRSA, PCR NEGATIVE NEGATIVE Final   Staphylococcus aureus POSITIVE (A) NEGATIVE Final    Comment:        The Xpert SA Assay (FDA approved for NASAL specimens in patients over 41 years of age), is one component of a comprehensive surveillance program.  Test performance has been validated by [facility] for patients greater than or equal to 70 year old. It is not intended to diagnose infection nor to guide or monitor treatment.      Labs: Basic Metabolic Panel:  Recent Labs Lab 10/12/15 1831 10/13/15 0451 10/14/15 0355  NA 141 143 140  K 3.1* 3.6 3.6  CL 104 109 105  CO2 25 25 26   GLUCOSE 96 111* 100*  BUN 17 16 18   CREATININE 0.87 0.89 0.85  CALCIUM 9.4 8.7* 8.6*  MG  --  2.0  --    Liver Function Tests: No results for input(s): AST, ALT, ALKPHOS, BILITOT, PROT, ALBUMIN in the last 168 hours. No results for input(s): LIPASE, AMYLASE in the last 168 hours. No results for input(s): AMMONIA in the last 168 hours. CBC:  Recent Labs Lab 10/12/15 1831 10/13/15 0451 10/14/15 0355  WBC 8.7 7.2 5.9  HGB 13.5 11.0* 10.5*  HCT 41.1 34.4* 33.5*  MCV 96.9 96.4 97.4  PLT 155 143* 131*   Cardiac Enzymes: No results for input(s): CKTOTAL, CKMB, CKMBINDEX, TROPONINI in the last 168 hours. BNP: BNP (last 3 results) No results for input(s): BNP in the last 8760 hours.  ProBNP (last 3 results) No results for input(s): PROBNP in the last 8760 hours.  CBG: No results for input(s): GLUCAP in the last 168 hours.     SignedMonica Becton MD.  [facility] 10/14/2015, 1:30 PM

## 2015-10-14 NOTE — Progress Notes (Addendum)
Physical Therapy Treatment Patient Details Name: Michelle Gaines MRN: [id number] DOB: 1928/08/26 Today's Date: 10/14/2015    History of Present Illness 82 y.o. female with medical history significant of hypertension, GERD, anxiety, CAD (s/p stent), PAD (s/p of aortofemoral popliteal bypass on the right), overt cancer, atrial fibrillation not on anticoagulants possibly due to high risk of fall, arthritis, hip fracture, who presents with left ankle pain after fall. Pt now with hard cast s/p closed reduction with no plan for internal fixation.    PT Comments    Pt progressing towards physical therapy goals. Pain appears to be better controlled with hard cast applied.  Focus of session was making sure pt and husband felt comfortable ascending step into home. Pt was able to negotiate stair and practice x5 to complete training. Pt maintained NWB throughout session although she was cued for TDWB. Pt and husband anticipate d/c home this afternoon. Will continue to follow.   Follow Up Recommendations  Home health PT;Supervision for mobility/OOB     Equipment Recommendations  Rolling walker with 5" wheels;3in1 (PT)    Recommendations for Other Services       Precautions / Restrictions Precautions Precautions: Fall Restrictions Weight Bearing Restrictions: Yes LLE Weight Bearing: Touchdown weight bearing    Mobility  Bed Mobility Overal bed mobility: Modified Independent Bed Mobility: Supine to Sit           General bed mobility comments: No assist required, and no difficulty noted.   Transfers Overall transfer level: Needs assistance Equipment used: Rolling walker (2 wheeled) Transfers: Sit to/from Stand Sit to Stand: Supervision         General transfer comment: Close supervision for safety. Pt demonstrated proper hand placement and safety awareness with transfers. No hands-on assist required.   Ambulation/Gait Ambulation/Gait assistance: Supervision Ambulation Distance  (Feet): 25 Feet Assistive device: Rolling walker (2 wheeled) Gait Pattern/deviations: Step-to pattern;Decreased stride length;Trunk flexed Gait velocity: Decreased Gait velocity interpretation: Below normal speed for age/gender General Gait Details: Pt was able to ambulate around room well with RW for support. Pt was cued for TDWB status but maintained NWB status instead.    Stairs Stairs: Yes Stairs assistance: Min guard Stair Management: No rails;Backwards;With walker Number of Stairs: 1 (x5 practice trials) General stair comments: Pt was able to back up to stair and ascend well. Hands-on guarding provided for safety but no assist required. Original plan was for college-aged grandson to carry pt into the house, but advised against this for safety as pt can clear the step.  Wheelchair Mobility    Modified Rankin (Stroke Patients Only)       Balance Overall balance assessment: Needs assistance Sitting-balance support: Feet supported;No upper extremity supported Sitting balance-Leahy Scale: Good     Standing balance support: Bilateral upper extremity supported;During functional activity Standing balance-Leahy Scale: Poor Standing balance comment: Requires UE support to maintain WB precautions                    Cognition Arousal/Alertness: Awake/alert Behavior During Therapy: WFL for tasks assessed/performed Overall Cognitive Status: Within Functional Limits for tasks assessed                      Exercises      General Comments        Pertinent Vitals/Pain Pain Assessment: Faces Faces Pain Scale: Hurts a little bit Pain Location: L ankle Pain Descriptors / Indicators: Aching Pain Intervention(s): Limited activity within patient's tolerance;Monitored during session;Repositioned  Home Living                      Prior Function            PT Goals (current goals can now be found in the care plan section) Acute Rehab PT Goals Patient  Stated Goal: Home today PT Goal Formulation: With patient/family Time For Goal Achievement: 10/20/15 Potential to Achieve Goals: Good Progress towards PT goals: Progressing toward goals    Frequency  Min 5X/week    PT Plan Current plan remains appropriate    Co-evaluation             End of Session Equipment Utilized During Treatment: Gait belt Activity Tolerance: Patient tolerated treatment well Patient left: in chair;with call bell/phone within reach;with family/visitor present     Time: 1119-1150 PT Time Calculation (min) (ACUTE ONLY): 31 min  Charges:  $Gait Training: 23-37 mins                    Rolinda Roan 10/14/2015, 12:57 PM   Rolinda Roan, PT, DPT Acute Rehabilitation Services Pager: [phone]

## 2015-10-18 NOTE — Progress Notes (Signed)
Physical Therapy Evaluation Addendum for G-Codes    Oct 28, 2015 2000  Restrictions  Weight Bearing Restrictions Yes  LLE Weight Bearing PWB  PT G-Codes **NOT FOR INPATIENT CLASS**  Functional Assessment Tool Used Clinical judgement  Functional Limitation Mobility: Walking and moving around  Mobility: Walking and Moving Around Current Status [id number]) CI  Mobility: Walking and Moving Around Goal Status [id number]) CI   Rolinda Roan, PT, DPT Acute Rehabilitation Services Pager: [phone]

## 2015-10-19 NOTE — Progress Notes (Signed)
Late entry for missed g-code    Oct 28, 2015 0700  OT G-codes **NOT FOR INPATIENT CLASS**  Functional Assessment Tool Used Clinical judgement  Functional Limitation Self care  Self Care Current Status [phone]) CI  Self Care Goal Status [id number]) CI   Truman Hayward M.S., OTR/L Pager: [phone]

## 2015-10-24 DIAGNOSIS — S82842D Displaced bimalleolar fracture of left lower leg, subsequent encounter for closed fracture with routine healing: Secondary | ICD-10-CM | POA: Diagnosis not present

## 2015-10-25 DIAGNOSIS — K219 Gastro-esophageal reflux disease without esophagitis: Secondary | ICD-10-CM | POA: Diagnosis not present

## 2015-10-25 DIAGNOSIS — I739 Peripheral vascular disease, unspecified: Secondary | ICD-10-CM | POA: Diagnosis not present

## 2015-10-25 DIAGNOSIS — Z7982 Long term (current) use of aspirin: Secondary | ICD-10-CM | POA: Diagnosis not present

## 2015-10-25 DIAGNOSIS — F419 Anxiety disorder, unspecified: Secondary | ICD-10-CM | POA: Diagnosis not present

## 2015-10-25 DIAGNOSIS — I2510 Atherosclerotic heart disease of native coronary artery without angina pectoris: Secondary | ICD-10-CM | POA: Diagnosis not present

## 2015-10-25 DIAGNOSIS — Z9181 History of falling: Secondary | ICD-10-CM | POA: Diagnosis not present

## 2015-10-25 DIAGNOSIS — I10 Essential (primary) hypertension: Secondary | ICD-10-CM | POA: Diagnosis not present

## 2015-10-25 DIAGNOSIS — I4892 Unspecified atrial flutter: Secondary | ICD-10-CM | POA: Diagnosis not present

## 2015-10-25 DIAGNOSIS — S82899D Other fracture of unspecified lower leg, subsequent encounter for closed fracture with routine healing: Secondary | ICD-10-CM | POA: Diagnosis not present

## 2015-10-26 DIAGNOSIS — F419 Anxiety disorder, unspecified: Secondary | ICD-10-CM | POA: Diagnosis not present

## 2015-10-26 DIAGNOSIS — Z7982 Long term (current) use of aspirin: Secondary | ICD-10-CM | POA: Diagnosis not present

## 2015-10-26 DIAGNOSIS — K219 Gastro-esophageal reflux disease without esophagitis: Secondary | ICD-10-CM | POA: Diagnosis not present

## 2015-10-26 DIAGNOSIS — Z9181 History of falling: Secondary | ICD-10-CM | POA: Diagnosis not present

## 2015-10-26 DIAGNOSIS — S82899D Other fracture of unspecified lower leg, subsequent encounter for closed fracture with routine healing: Secondary | ICD-10-CM | POA: Diagnosis not present

## 2015-10-26 DIAGNOSIS — I4892 Unspecified atrial flutter: Secondary | ICD-10-CM | POA: Diagnosis not present

## 2015-10-26 DIAGNOSIS — I739 Peripheral vascular disease, unspecified: Secondary | ICD-10-CM | POA: Diagnosis not present

## 2015-10-26 DIAGNOSIS — I2510 Atherosclerotic heart disease of native coronary artery without angina pectoris: Secondary | ICD-10-CM | POA: Diagnosis not present

## 2015-10-26 DIAGNOSIS — I10 Essential (primary) hypertension: Secondary | ICD-10-CM | POA: Diagnosis not present

## 2015-10-28 DIAGNOSIS — I739 Peripheral vascular disease, unspecified: Secondary | ICD-10-CM | POA: Diagnosis not present

## 2015-10-28 DIAGNOSIS — F419 Anxiety disorder, unspecified: Secondary | ICD-10-CM | POA: Diagnosis not present

## 2015-10-28 DIAGNOSIS — I10 Essential (primary) hypertension: Secondary | ICD-10-CM | POA: Diagnosis not present

## 2015-10-28 DIAGNOSIS — K219 Gastro-esophageal reflux disease without esophagitis: Secondary | ICD-10-CM | POA: Diagnosis not present

## 2015-10-28 DIAGNOSIS — Z7982 Long term (current) use of aspirin: Secondary | ICD-10-CM | POA: Diagnosis not present

## 2015-10-28 DIAGNOSIS — I4892 Unspecified atrial flutter: Secondary | ICD-10-CM | POA: Diagnosis not present

## 2015-10-28 DIAGNOSIS — S82899D Other fracture of unspecified lower leg, subsequent encounter for closed fracture with routine healing: Secondary | ICD-10-CM | POA: Diagnosis not present

## 2015-10-28 DIAGNOSIS — Z9181 History of falling: Secondary | ICD-10-CM | POA: Diagnosis not present

## 2015-10-28 DIAGNOSIS — I2510 Atherosclerotic heart disease of native coronary artery without angina pectoris: Secondary | ICD-10-CM | POA: Diagnosis not present

## 2015-10-31 DIAGNOSIS — Z7982 Long term (current) use of aspirin: Secondary | ICD-10-CM | POA: Diagnosis not present

## 2015-10-31 DIAGNOSIS — K219 Gastro-esophageal reflux disease without esophagitis: Secondary | ICD-10-CM | POA: Diagnosis not present

## 2015-10-31 DIAGNOSIS — Z9181 History of falling: Secondary | ICD-10-CM | POA: Diagnosis not present

## 2015-10-31 DIAGNOSIS — I739 Peripheral vascular disease, unspecified: Secondary | ICD-10-CM | POA: Diagnosis not present

## 2015-10-31 DIAGNOSIS — S82899D Other fracture of unspecified lower leg, subsequent encounter for closed fracture with routine healing: Secondary | ICD-10-CM | POA: Diagnosis not present

## 2015-10-31 DIAGNOSIS — I10 Essential (primary) hypertension: Secondary | ICD-10-CM | POA: Diagnosis not present

## 2015-10-31 DIAGNOSIS — F419 Anxiety disorder, unspecified: Secondary | ICD-10-CM | POA: Diagnosis not present

## 2015-10-31 DIAGNOSIS — I2510 Atherosclerotic heart disease of native coronary artery without angina pectoris: Secondary | ICD-10-CM | POA: Diagnosis not present

## 2015-10-31 DIAGNOSIS — I4892 Unspecified atrial flutter: Secondary | ICD-10-CM | POA: Diagnosis not present

## 2015-11-02 DIAGNOSIS — S82899D Other fracture of unspecified lower leg, subsequent encounter for closed fracture with routine healing: Secondary | ICD-10-CM | POA: Diagnosis not present

## 2015-11-02 DIAGNOSIS — F419 Anxiety disorder, unspecified: Secondary | ICD-10-CM | POA: Diagnosis not present

## 2015-11-02 DIAGNOSIS — K219 Gastro-esophageal reflux disease without esophagitis: Secondary | ICD-10-CM | POA: Diagnosis not present

## 2015-11-02 DIAGNOSIS — Z7982 Long term (current) use of aspirin: Secondary | ICD-10-CM | POA: Diagnosis not present

## 2015-11-02 DIAGNOSIS — I4892 Unspecified atrial flutter: Secondary | ICD-10-CM | POA: Diagnosis not present

## 2015-11-02 DIAGNOSIS — I2510 Atherosclerotic heart disease of native coronary artery without angina pectoris: Secondary | ICD-10-CM | POA: Diagnosis not present

## 2015-11-02 DIAGNOSIS — I10 Essential (primary) hypertension: Secondary | ICD-10-CM | POA: Diagnosis not present

## 2015-11-02 DIAGNOSIS — Z9181 History of falling: Secondary | ICD-10-CM | POA: Diagnosis not present

## 2015-11-02 DIAGNOSIS — I739 Peripheral vascular disease, unspecified: Secondary | ICD-10-CM | POA: Diagnosis not present

## 2015-11-03 DIAGNOSIS — S82899D Other fracture of unspecified lower leg, subsequent encounter for closed fracture with routine healing: Secondary | ICD-10-CM | POA: Diagnosis not present

## 2015-11-03 DIAGNOSIS — I739 Peripheral vascular disease, unspecified: Secondary | ICD-10-CM | POA: Diagnosis not present

## 2015-11-03 DIAGNOSIS — Z9181 History of falling: Secondary | ICD-10-CM | POA: Diagnosis not present

## 2015-11-03 DIAGNOSIS — I4892 Unspecified atrial flutter: Secondary | ICD-10-CM | POA: Diagnosis not present

## 2015-11-03 DIAGNOSIS — F419 Anxiety disorder, unspecified: Secondary | ICD-10-CM | POA: Diagnosis not present

## 2015-11-03 DIAGNOSIS — I10 Essential (primary) hypertension: Secondary | ICD-10-CM | POA: Diagnosis not present

## 2015-11-03 DIAGNOSIS — Z7982 Long term (current) use of aspirin: Secondary | ICD-10-CM | POA: Diagnosis not present

## 2015-11-03 DIAGNOSIS — K219 Gastro-esophageal reflux disease without esophagitis: Secondary | ICD-10-CM | POA: Diagnosis not present

## 2015-11-03 DIAGNOSIS — I2510 Atherosclerotic heart disease of native coronary artery without angina pectoris: Secondary | ICD-10-CM | POA: Diagnosis not present

## 2015-11-08 DIAGNOSIS — I10 Essential (primary) hypertension: Secondary | ICD-10-CM | POA: Diagnosis not present

## 2015-11-08 DIAGNOSIS — K219 Gastro-esophageal reflux disease without esophagitis: Secondary | ICD-10-CM | POA: Diagnosis not present

## 2015-11-08 DIAGNOSIS — I2510 Atherosclerotic heart disease of native coronary artery without angina pectoris: Secondary | ICD-10-CM | POA: Diagnosis not present

## 2015-11-08 DIAGNOSIS — Z7982 Long term (current) use of aspirin: Secondary | ICD-10-CM | POA: Diagnosis not present

## 2015-11-08 DIAGNOSIS — I739 Peripheral vascular disease, unspecified: Secondary | ICD-10-CM | POA: Diagnosis not present

## 2015-11-08 DIAGNOSIS — S82899D Other fracture of unspecified lower leg, subsequent encounter for closed fracture with routine healing: Secondary | ICD-10-CM | POA: Diagnosis not present

## 2015-11-08 DIAGNOSIS — F419 Anxiety disorder, unspecified: Secondary | ICD-10-CM | POA: Diagnosis not present

## 2015-11-08 DIAGNOSIS — I4892 Unspecified atrial flutter: Secondary | ICD-10-CM | POA: Diagnosis not present

## 2015-11-08 DIAGNOSIS — Z9181 History of falling: Secondary | ICD-10-CM | POA: Diagnosis not present

## 2015-11-10 DIAGNOSIS — Z9181 History of falling: Secondary | ICD-10-CM | POA: Diagnosis not present

## 2015-11-10 DIAGNOSIS — I4892 Unspecified atrial flutter: Secondary | ICD-10-CM | POA: Diagnosis not present

## 2015-11-10 DIAGNOSIS — K219 Gastro-esophageal reflux disease without esophagitis: Secondary | ICD-10-CM | POA: Diagnosis not present

## 2015-11-10 DIAGNOSIS — I739 Peripheral vascular disease, unspecified: Secondary | ICD-10-CM | POA: Diagnosis not present

## 2015-11-10 DIAGNOSIS — Z7982 Long term (current) use of aspirin: Secondary | ICD-10-CM | POA: Diagnosis not present

## 2015-11-10 DIAGNOSIS — S82899D Other fracture of unspecified lower leg, subsequent encounter for closed fracture with routine healing: Secondary | ICD-10-CM | POA: Diagnosis not present

## 2015-11-10 DIAGNOSIS — I2510 Atherosclerotic heart disease of native coronary artery without angina pectoris: Secondary | ICD-10-CM | POA: Diagnosis not present

## 2015-11-10 DIAGNOSIS — I10 Essential (primary) hypertension: Secondary | ICD-10-CM | POA: Diagnosis not present

## 2015-11-10 DIAGNOSIS — F419 Anxiety disorder, unspecified: Secondary | ICD-10-CM | POA: Diagnosis not present

## 2015-11-14 DIAGNOSIS — S82899D Other fracture of unspecified lower leg, subsequent encounter for closed fracture with routine healing: Secondary | ICD-10-CM | POA: Diagnosis not present

## 2015-11-14 DIAGNOSIS — Z9181 History of falling: Secondary | ICD-10-CM | POA: Diagnosis not present

## 2015-11-14 DIAGNOSIS — I10 Essential (primary) hypertension: Secondary | ICD-10-CM | POA: Diagnosis not present

## 2015-11-14 DIAGNOSIS — S82842S Displaced bimalleolar fracture of left lower leg, sequela: Secondary | ICD-10-CM | POA: Diagnosis not present

## 2015-11-14 DIAGNOSIS — F419 Anxiety disorder, unspecified: Secondary | ICD-10-CM | POA: Diagnosis not present

## 2015-11-14 DIAGNOSIS — I739 Peripheral vascular disease, unspecified: Secondary | ICD-10-CM | POA: Diagnosis not present

## 2015-11-14 DIAGNOSIS — I2510 Atherosclerotic heart disease of native coronary artery without angina pectoris: Secondary | ICD-10-CM | POA: Diagnosis not present

## 2015-11-14 DIAGNOSIS — I4892 Unspecified atrial flutter: Secondary | ICD-10-CM | POA: Diagnosis not present

## 2015-11-14 DIAGNOSIS — K219 Gastro-esophageal reflux disease without esophagitis: Secondary | ICD-10-CM | POA: Diagnosis not present

## 2015-11-14 DIAGNOSIS — Z7982 Long term (current) use of aspirin: Secondary | ICD-10-CM | POA: Diagnosis not present

## 2015-11-15 DIAGNOSIS — I10 Essential (primary) hypertension: Secondary | ICD-10-CM | POA: Diagnosis not present

## 2015-11-15 DIAGNOSIS — I2510 Atherosclerotic heart disease of native coronary artery without angina pectoris: Secondary | ICD-10-CM | POA: Diagnosis not present

## 2015-11-15 DIAGNOSIS — K219 Gastro-esophageal reflux disease without esophagitis: Secondary | ICD-10-CM | POA: Diagnosis not present

## 2015-11-15 DIAGNOSIS — I739 Peripheral vascular disease, unspecified: Secondary | ICD-10-CM | POA: Diagnosis not present

## 2015-11-15 DIAGNOSIS — Z9181 History of falling: Secondary | ICD-10-CM | POA: Diagnosis not present

## 2015-11-15 DIAGNOSIS — S82899D Other fracture of unspecified lower leg, subsequent encounter for closed fracture with routine healing: Secondary | ICD-10-CM | POA: Diagnosis not present

## 2015-11-15 DIAGNOSIS — F419 Anxiety disorder, unspecified: Secondary | ICD-10-CM | POA: Diagnosis not present

## 2015-11-15 DIAGNOSIS — I4892 Unspecified atrial flutter: Secondary | ICD-10-CM | POA: Diagnosis not present

## 2015-11-15 DIAGNOSIS — Z7982 Long term (current) use of aspirin: Secondary | ICD-10-CM | POA: Diagnosis not present

## 2015-11-17 DIAGNOSIS — I4892 Unspecified atrial flutter: Secondary | ICD-10-CM | POA: Diagnosis not present

## 2015-11-17 DIAGNOSIS — I739 Peripheral vascular disease, unspecified: Secondary | ICD-10-CM | POA: Diagnosis not present

## 2015-11-17 DIAGNOSIS — K219 Gastro-esophageal reflux disease without esophagitis: Secondary | ICD-10-CM | POA: Diagnosis not present

## 2015-11-17 DIAGNOSIS — F419 Anxiety disorder, unspecified: Secondary | ICD-10-CM | POA: Diagnosis not present

## 2015-11-17 DIAGNOSIS — Z7982 Long term (current) use of aspirin: Secondary | ICD-10-CM | POA: Diagnosis not present

## 2015-11-17 DIAGNOSIS — I2510 Atherosclerotic heart disease of native coronary artery without angina pectoris: Secondary | ICD-10-CM | POA: Diagnosis not present

## 2015-11-17 DIAGNOSIS — I10 Essential (primary) hypertension: Secondary | ICD-10-CM | POA: Diagnosis not present

## 2015-11-17 DIAGNOSIS — Z9181 History of falling: Secondary | ICD-10-CM | POA: Diagnosis not present

## 2015-11-17 DIAGNOSIS — S82899D Other fracture of unspecified lower leg, subsequent encounter for closed fracture with routine healing: Secondary | ICD-10-CM | POA: Diagnosis not present

## 2015-11-21 DIAGNOSIS — K219 Gastro-esophageal reflux disease without esophagitis: Secondary | ICD-10-CM | POA: Diagnosis not present

## 2015-11-21 DIAGNOSIS — I4892 Unspecified atrial flutter: Secondary | ICD-10-CM | POA: Diagnosis not present

## 2015-11-21 DIAGNOSIS — Z9181 History of falling: Secondary | ICD-10-CM | POA: Diagnosis not present

## 2015-11-21 DIAGNOSIS — F419 Anxiety disorder, unspecified: Secondary | ICD-10-CM | POA: Diagnosis not present

## 2015-11-21 DIAGNOSIS — Z7982 Long term (current) use of aspirin: Secondary | ICD-10-CM | POA: Diagnosis not present

## 2015-11-21 DIAGNOSIS — I2510 Atherosclerotic heart disease of native coronary artery without angina pectoris: Secondary | ICD-10-CM | POA: Diagnosis not present

## 2015-11-21 DIAGNOSIS — I739 Peripheral vascular disease, unspecified: Secondary | ICD-10-CM | POA: Diagnosis not present

## 2015-11-21 DIAGNOSIS — I10 Essential (primary) hypertension: Secondary | ICD-10-CM | POA: Diagnosis not present

## 2015-11-21 DIAGNOSIS — S82899D Other fracture of unspecified lower leg, subsequent encounter for closed fracture with routine healing: Secondary | ICD-10-CM | POA: Diagnosis not present

## 2015-11-25 DIAGNOSIS — I739 Peripheral vascular disease, unspecified: Secondary | ICD-10-CM | POA: Diagnosis not present

## 2015-11-25 DIAGNOSIS — Z7982 Long term (current) use of aspirin: Secondary | ICD-10-CM | POA: Diagnosis not present

## 2015-11-25 DIAGNOSIS — F419 Anxiety disorder, unspecified: Secondary | ICD-10-CM | POA: Diagnosis not present

## 2015-11-25 DIAGNOSIS — S82899D Other fracture of unspecified lower leg, subsequent encounter for closed fracture with routine healing: Secondary | ICD-10-CM | POA: Diagnosis not present

## 2015-11-25 DIAGNOSIS — I4892 Unspecified atrial flutter: Secondary | ICD-10-CM | POA: Diagnosis not present

## 2015-11-25 DIAGNOSIS — I2510 Atherosclerotic heart disease of native coronary artery without angina pectoris: Secondary | ICD-10-CM | POA: Diagnosis not present

## 2015-11-25 DIAGNOSIS — I10 Essential (primary) hypertension: Secondary | ICD-10-CM | POA: Diagnosis not present

## 2015-11-25 DIAGNOSIS — Z9181 History of falling: Secondary | ICD-10-CM | POA: Diagnosis not present

## 2015-11-25 DIAGNOSIS — K219 Gastro-esophageal reflux disease without esophagitis: Secondary | ICD-10-CM | POA: Diagnosis not present

## 2015-11-28 DIAGNOSIS — K219 Gastro-esophageal reflux disease without esophagitis: Secondary | ICD-10-CM | POA: Diagnosis not present

## 2015-11-28 DIAGNOSIS — I4892 Unspecified atrial flutter: Secondary | ICD-10-CM | POA: Diagnosis not present

## 2015-11-28 DIAGNOSIS — S82899D Other fracture of unspecified lower leg, subsequent encounter for closed fracture with routine healing: Secondary | ICD-10-CM | POA: Diagnosis not present

## 2015-11-28 DIAGNOSIS — I2510 Atherosclerotic heart disease of native coronary artery without angina pectoris: Secondary | ICD-10-CM | POA: Diagnosis not present

## 2015-11-28 DIAGNOSIS — F419 Anxiety disorder, unspecified: Secondary | ICD-10-CM | POA: Diagnosis not present

## 2015-11-28 DIAGNOSIS — Z7982 Long term (current) use of aspirin: Secondary | ICD-10-CM | POA: Diagnosis not present

## 2015-11-28 DIAGNOSIS — I739 Peripheral vascular disease, unspecified: Secondary | ICD-10-CM | POA: Diagnosis not present

## 2015-11-28 DIAGNOSIS — I10 Essential (primary) hypertension: Secondary | ICD-10-CM | POA: Diagnosis not present

## 2015-11-28 DIAGNOSIS — Z9181 History of falling: Secondary | ICD-10-CM | POA: Diagnosis not present

## 2015-12-01 DIAGNOSIS — I4892 Unspecified atrial flutter: Secondary | ICD-10-CM | POA: Diagnosis not present

## 2015-12-01 DIAGNOSIS — Z9181 History of falling: Secondary | ICD-10-CM | POA: Diagnosis not present

## 2015-12-01 DIAGNOSIS — Z7982 Long term (current) use of aspirin: Secondary | ICD-10-CM | POA: Diagnosis not present

## 2015-12-01 DIAGNOSIS — I10 Essential (primary) hypertension: Secondary | ICD-10-CM | POA: Diagnosis not present

## 2015-12-01 DIAGNOSIS — F419 Anxiety disorder, unspecified: Secondary | ICD-10-CM | POA: Diagnosis not present

## 2015-12-01 DIAGNOSIS — S82899D Other fracture of unspecified lower leg, subsequent encounter for closed fracture with routine healing: Secondary | ICD-10-CM | POA: Diagnosis not present

## 2015-12-01 DIAGNOSIS — K219 Gastro-esophageal reflux disease without esophagitis: Secondary | ICD-10-CM | POA: Diagnosis not present

## 2015-12-01 DIAGNOSIS — I2510 Atherosclerotic heart disease of native coronary artery without angina pectoris: Secondary | ICD-10-CM | POA: Diagnosis not present

## 2015-12-01 DIAGNOSIS — I739 Peripheral vascular disease, unspecified: Secondary | ICD-10-CM | POA: Diagnosis not present

## 2015-12-05 DIAGNOSIS — I70212 Atherosclerosis of native arteries of extremities with intermittent claudication, left leg: Secondary | ICD-10-CM | POA: Diagnosis not present

## 2015-12-05 DIAGNOSIS — S82842S Displaced bimalleolar fracture of left lower leg, sequela: Secondary | ICD-10-CM | POA: Diagnosis not present

## 2015-12-06 DIAGNOSIS — Z7982 Long term (current) use of aspirin: Secondary | ICD-10-CM | POA: Diagnosis not present

## 2015-12-06 DIAGNOSIS — I739 Peripheral vascular disease, unspecified: Secondary | ICD-10-CM | POA: Diagnosis not present

## 2015-12-06 DIAGNOSIS — F419 Anxiety disorder, unspecified: Secondary | ICD-10-CM | POA: Diagnosis not present

## 2015-12-06 DIAGNOSIS — I10 Essential (primary) hypertension: Secondary | ICD-10-CM | POA: Diagnosis not present

## 2015-12-06 DIAGNOSIS — K219 Gastro-esophageal reflux disease without esophagitis: Secondary | ICD-10-CM | POA: Diagnosis not present

## 2015-12-06 DIAGNOSIS — Z9181 History of falling: Secondary | ICD-10-CM | POA: Diagnosis not present

## 2015-12-06 DIAGNOSIS — I2510 Atherosclerotic heart disease of native coronary artery without angina pectoris: Secondary | ICD-10-CM | POA: Diagnosis not present

## 2015-12-06 DIAGNOSIS — I4892 Unspecified atrial flutter: Secondary | ICD-10-CM | POA: Diagnosis not present

## 2015-12-06 DIAGNOSIS — S82899D Other fracture of unspecified lower leg, subsequent encounter for closed fracture with routine healing: Secondary | ICD-10-CM | POA: Diagnosis not present

## 2015-12-09 DIAGNOSIS — K219 Gastro-esophageal reflux disease without esophagitis: Secondary | ICD-10-CM | POA: Diagnosis not present

## 2015-12-09 DIAGNOSIS — I2510 Atherosclerotic heart disease of native coronary artery without angina pectoris: Secondary | ICD-10-CM | POA: Diagnosis not present

## 2015-12-09 DIAGNOSIS — Z9181 History of falling: Secondary | ICD-10-CM | POA: Diagnosis not present

## 2015-12-09 DIAGNOSIS — Z7982 Long term (current) use of aspirin: Secondary | ICD-10-CM | POA: Diagnosis not present

## 2015-12-09 DIAGNOSIS — F419 Anxiety disorder, unspecified: Secondary | ICD-10-CM | POA: Diagnosis not present

## 2015-12-09 DIAGNOSIS — I10 Essential (primary) hypertension: Secondary | ICD-10-CM | POA: Diagnosis not present

## 2015-12-09 DIAGNOSIS — I4892 Unspecified atrial flutter: Secondary | ICD-10-CM | POA: Diagnosis not present

## 2015-12-09 DIAGNOSIS — S82899D Other fracture of unspecified lower leg, subsequent encounter for closed fracture with routine healing: Secondary | ICD-10-CM | POA: Diagnosis not present

## 2015-12-09 DIAGNOSIS — I739 Peripheral vascular disease, unspecified: Secondary | ICD-10-CM | POA: Diagnosis not present

## 2015-12-13 DIAGNOSIS — F419 Anxiety disorder, unspecified: Secondary | ICD-10-CM | POA: Diagnosis not present

## 2015-12-13 DIAGNOSIS — S82899D Other fracture of unspecified lower leg, subsequent encounter for closed fracture with routine healing: Secondary | ICD-10-CM | POA: Diagnosis not present

## 2015-12-13 DIAGNOSIS — Z7982 Long term (current) use of aspirin: Secondary | ICD-10-CM | POA: Diagnosis not present

## 2015-12-13 DIAGNOSIS — I10 Essential (primary) hypertension: Secondary | ICD-10-CM | POA: Diagnosis not present

## 2015-12-13 DIAGNOSIS — K219 Gastro-esophageal reflux disease without esophagitis: Secondary | ICD-10-CM | POA: Diagnosis not present

## 2015-12-13 DIAGNOSIS — I4892 Unspecified atrial flutter: Secondary | ICD-10-CM | POA: Diagnosis not present

## 2015-12-13 DIAGNOSIS — Z9181 History of falling: Secondary | ICD-10-CM | POA: Diagnosis not present

## 2015-12-13 DIAGNOSIS — I2510 Atherosclerotic heart disease of native coronary artery without angina pectoris: Secondary | ICD-10-CM | POA: Diagnosis not present

## 2015-12-13 DIAGNOSIS — I739 Peripheral vascular disease, unspecified: Secondary | ICD-10-CM | POA: Diagnosis not present

## 2016-01-12 ENCOUNTER — Encounter: Payer: Self-pay | Admitting: Family

## 2016-01-17 ENCOUNTER — Encounter (HOSPITAL_COMMUNITY): Payer: Medicare Other

## 2016-01-17 ENCOUNTER — Other Ambulatory Visit (HOSPITAL_COMMUNITY): Payer: Medicare Other

## 2016-01-17 ENCOUNTER — Ambulatory Visit: Payer: Medicare Other | Admitting: Family

## 2016-01-18 ENCOUNTER — Encounter: Payer: Self-pay | Admitting: Family

## 2016-01-18 ENCOUNTER — Ambulatory Visit (HOSPITAL_COMMUNITY)
Admission: RE | Admit: 2016-01-18 | Discharge: 2016-01-18 | Disposition: A | Discharge status: Home / Self Care | Payer: Medicare Other | Source: Ambulatory Visit | Attending: Family | Admitting: Family

## 2016-01-18 ENCOUNTER — Ambulatory Visit (INDEPENDENT_AMBULATORY_CARE_PROVIDER_SITE_OTHER): Payer: Medicare Other | Admitting: Family

## 2016-01-18 ENCOUNTER — Ambulatory Visit (INDEPENDENT_AMBULATORY_CARE_PROVIDER_SITE_OTHER)
Admission: RE | Admit: 2016-01-18 | Discharge: 2016-01-18 | Disposition: A | Discharge status: Home / Self Care | Payer: Medicare Other | Source: Ambulatory Visit | Attending: Family | Admitting: Family

## 2016-01-18 VITALS — BP 201/82 | HR 67 | Temp 98.4°F | Resp 18 | Ht 61.25 in | Wt 81.3 lb

## 2016-01-18 DIAGNOSIS — Z4889 Encounter for other specified surgical aftercare: Secondary | ICD-10-CM | POA: Diagnosis not present

## 2016-01-18 DIAGNOSIS — Z95828 Presence of other vascular implants and grafts: Secondary | ICD-10-CM

## 2016-01-18 DIAGNOSIS — I2510 Atherosclerotic heart disease of native coronary artery without angina pectoris: Secondary | ICD-10-CM | POA: Insufficient documentation

## 2016-01-18 DIAGNOSIS — I779 Disorder of arteries and arterioles, unspecified: Secondary | ICD-10-CM | POA: Insufficient documentation

## 2016-01-18 DIAGNOSIS — I10 Essential (primary) hypertension: Secondary | ICD-10-CM | POA: Diagnosis not present

## 2016-01-18 DIAGNOSIS — Z48812 Encounter for surgical aftercare following surgery on the circulatory system: Secondary | ICD-10-CM

## 2016-01-18 DIAGNOSIS — K219 Gastro-esophageal reflux disease without esophagitis: Secondary | ICD-10-CM | POA: Insufficient documentation

## 2016-01-18 NOTE — Patient Instructions (Signed)
Peripheral Vascular Disease Peripheral vascular disease (PVD) is a disease of the blood vessels that are not part of your heart and brain. A simple term for PVD is poor circulation. In most cases, PVD narrows the blood vessels that carry blood from your heart to the rest of your body. This can result in a decreased supply of blood to your arms, legs, and internal organs, like your stomach or kidneys. However, it most often affects a person's lower legs and feet. There are two types of PVD.  Organic PVD. This is the more common type. It is caused by damage to the structure of blood vessels.  Functional PVD. This is caused by conditions that make blood vessels contract and tighten (spasm). Without treatment, PVD tends to get worse over time. PVD can also lead to acute ischemic limb. This is when an arm or limb suddenly has trouble getting enough blood. This is a medical emergency. CAUSES Each type of PVD has many different causes. The most common cause of PVD is buildup of a fatty material (plaque) inside of your arteries (atherosclerosis). Small amounts of plaque can break off from the walls of the blood vessels and become lodged in a smaller artery. This blocks blood flow and can cause acute ischemic limb. Other common causes of PVD include:  Blood clots that form inside of blood vessels.  Injuries to blood vessels.  Diseases that cause inflammation of blood vessels or cause blood vessel spasms.  Health behaviors and health history that increase your risk of developing PVD. RISK FACTORS  You may have a greater risk of PVD if you:  Have a family history of PVD.  Have certain medical conditions, including:  High cholesterol.  Diabetes.  High blood pressure (hypertension).  Coronary heart disease.  Past problems with blood clots.  Past injury, such as burns or a broken bone. These may have damaged blood vessels in your limbs.  Buerger disease. This is caused by inflamed blood  vessels in your hands and feet.  Some forms of arthritis.  Rare birth defects that affect the arteries in your legs.  Use tobacco.  Do not get enough exercise.  Are obese.  Are age 50 or older. SIGNS AND SYMPTOMS  PVD may cause many different symptoms. Your symptoms depend on what part of your body is not getting enough blood. Some common signs and symptoms include:  Cramps in your lower legs. This may be a symptom of poor leg circulation (claudication).  Pain and weakness in your legs while you are physically active that goes away when you rest (intermittent claudication).  Leg pain when at rest.  Leg numbness, tingling, or weakness.  Coldness in a leg or foot, especially when compared with the other leg.  Skin or hair changes. These can include:  Hair loss.  Shiny skin.  Pale or bluish skin.  Thick toenails.  Inability to get or maintain an erection (erectile dysfunction). People with PVD are more prone to developing ulcers and sores on their toes, feet, or legs. These may take longer than normal to heal. DIAGNOSIS Your health care provider may diagnose PVD from your signs and symptoms. The health care provider will also do a physical exam. You may have tests to find out what is causing your PVD and determine its severity. Tests may include:  Blood pressure recordings from your arms and legs and measurements of the strength of your pulses (pulse volume recordings).  Imaging studies using sound waves to take pictures of   the blood flow through your blood vessels (Doppler ultrasound).  Injecting a dye into your blood vessels before having imaging studies using:  X-rays (angiogram or arteriogram).  Computer-generated X-rays (CT angiogram).  A powerful electromagnetic field and a computer (magnetic resonance angiogram or MRA). TREATMENT Treatment for PVD depends on the cause of your condition and the severity of your symptoms. It also depends on your age. Underlying  causes need to be treated and controlled. These include long-lasting (chronic) conditions, such as diabetes, high cholesterol, and high blood pressure. You may need to first try making lifestyle changes and taking medicines. Surgery may be needed if these do not work. Lifestyle changes may include:  Quitting smoking.  Exercising regularly.  Following a low-fat, low-cholesterol diet. Medicines may include:  Blood thinners to prevent blood clots.  Medicines to improve blood flow.  Medicines to improve your blood cholesterol levels. Surgical procedures may include:  A procedure that uses an inflated balloon to open a blocked artery and improve blood flow (angioplasty).  A procedure to put in a tube (stent) to keep a blocked artery open (stent implant).  Surgery to reroute blood flow around a blocked artery (peripheral bypass surgery).  Surgery to remove dead tissue from an infected wound on the affected limb.  Amputation. This is surgical removal of the affected limb. This may be necessary in cases of acute ischemic limb that are not improved through medical or surgical treatments. HOME CARE INSTRUCTIONS  Take medicines only as directed by your health care provider.  Do not use any tobacco products, including cigarettes, chewing tobacco, or electronic cigarettes. If you need help quitting, ask your health care provider.  Lose weight if you are overweight, and maintain a healthy weight as directed by your health care provider.  Eat a diet that is low in fat and cholesterol. If you need help, ask your health care provider.  Exercise regularly. Ask your health care provider to suggest some good activities for you.  Use compression stockings or other mechanical devices as directed by your health care provider.  Take good care of your feet.  Wear comfortable shoes that fit well.  Check your feet often for any cuts or sores. SEEK MEDICAL CARE IF:  You have cramps in your legs  while walking.  You have leg pain when you are at rest.  You have coldness in a leg or foot.  Your skin changes.  You have erectile dysfunction.  You have cuts or sores on your feet that are not healing. SEEK IMMEDIATE MEDICAL CARE IF:  Your arm or leg turns cold and blue.  Your arms or legs become red, warm, swollen, painful, or numb.  You have chest pain or trouble breathing.  You suddenly have weakness in your face, arm, or leg.  You become very confused or lose the ability to speak.  You suddenly have a very bad headache or lose your vision.   This information is not intended to replace advice given to you by your health care provider. Make sure you discuss any questions you have with your health care provider.   Document Released: 07/19/2004 Document Revised: 07/02/2014 Document Reviewed: 11/19/2013 [employer]vier Interactive Patient Education 2016 [employer]  

## 2016-01-18 NOTE — Progress Notes (Signed)
[facility] [place]   CC: Follow up peripheral artery occlusive disease  History of Present Illness Michelle Gaines is a 82 y.o. female  patient of Dr. Kellie Simmering returns for continuing followup regarding her right external iliac to profunda femoris bypass performed in 2009 for ischemia of the right leg. She continues to have no specific symptoms of rest pain infection or nonhealing ulcers.   She has left thigh pain with walking since her left femur fracture and ORIF in February 2016.  She denies claudication type pain with walking.  She is performing daily seated leg exercises as instructed by physical therapy which she feels helps her legs.  Pt states she has always been thin, her appetite is good. Pt denies any history of stroke or TIA.   Pt Diabetic: Yes Pt smoker: non-smoker  Pt meds include: Statin :No, states she does not want to take Betablocker: Yes ASA: No Other anticoagulants/antiplatelets: no     Past Medical History:  Diagnosis Date  . Ankle fracture, left 09/2015  . Arthritis   . Atrial flutter ([place])   . Cancer ([place])    OVARIAN    1967  . Coronary artery disease   . Dysrhythmia    ATRIAL FLUTTER  . GERD (gastroesophageal reflux disease)   . Hypertension   . Peripheral arterial disease ([place])   . PVD (peripheral vascular disease) ([place])     Social History Social History  Substance Use Topics  . Smoking status: Never Smoker  . Smokeless tobacco: Never Used  . Alcohol use No    Family History Family History  Problem Relation Age of Onset  . COPD Father   . Hypertension Father     Past Surgical History:  Procedure Laterality Date  . ABDOMINAL HYSTERECTOMY    . ATRIAL FLUTTER ABLATION N/A 05/22/2013   Procedure: ATRIAL FLUTTER ABLATION;  Surgeon: Evans Lance, MD;  Location: [facility] C[facility] LAB;  Service: Cardiovascular;  Laterality: N/A;  . BPG    . CAROTID STENT  11/30/09   LAD S/P Right CA DES  . FRACTURE SURGERY Left Feb. 2,  2016   Left Hip Fx   . GLIAYTE CATHERTER INSERTION  06/27/10  . INTRAMEDULLARY (IM) NAIL INTERTROCHANTERIC Left 07/28/2014   Procedure: Left Affixus Nail;  Surgeon: Marybelle Killings, MD;  Location: [place];  Service: Orthopedics;  Laterality: Left;  . PR VEIN BYPASS GRAFT,AORTO-FEM-POP     Right     Allergies  Allergen Reactions  . Fexofenadine Shortness Of Breath  . Cephalosporins Rash  . Diltiazem Other (See Comments)    Dizziness   . Penicillins Nausea And Vomiting, Swelling and Rash  . Amlodipine Other (See Comments)  . Anaprox [Naproxen Sodium] Other (See Comments)  . Benzonatate Other (See Comments)  . Cephalexin Other (See Comments)  . Clindamycin/Lincomycin Other (See Comments)  . Clorazepate Other (See Comments)  . Codeine Other (See Comments)  . Doxycycline Other (See Comments)  . Hydralazine Other (See Comments)  . Labetalol Other (See Comments)  . Lisinopril Other (See Comments)  . Phenylephrine Other (See Comments)  . Phenytoin Other (See Comments)  . Zolpidem Other (See Comments)    Current Outpatient Prescriptions  Medication Sig Dispense Refill  . BETA CAROTENE PO Take 1 tablet by mouth daily.    . Flaxseed, Linseed, (FLAX SEED OIL PO) Take 1 tablet by mouth daily.    Marland Kitchen LORazepam (ATIVAN) 0.5 MG tablet Take 0.5 mg by mouth at bedtime.     Marland Kitchen  Lutein 20 MG TABS Take 20 mg by mouth daily.    . magnesium gluconate (MAGONATE) 500 MG tablet Take 500 mg by mouth 2 (two) times daily.      . multivitamin (THERAGRAN) per tablet Take 1 tablet by mouth daily.      . nitroGLYCERIN (NITROSTAT) 0.4 MG SL tablet Place 1 tablet (0.4 mg total) under the tongue every 5 (five) minutes as needed for chest pain. 25 tablet 12  . Potassium 99 MG TABS Take 1 tablet by mouth daily.      Marland Kitchen Ubiquinol 50 MG CAPS Take 50 mg by mouth 2 (two) times daily.    Marland Kitchen VITAMIN D, CHOLECALCIFEROL, PO Take 1,000 mg by mouth daily.     . vitamin E 400 UNIT capsule Take 400 Units by mouth daily.    Marland Kitchen aspirin  (BAYER ASPIRIN) 325 MG tablet Take 1 tablet (325 mg total) by mouth daily. (Patient not taking: Reported on 01/11/2015) 28 tablet 0  . carvedilol (COREG) 6.25 MG tablet Take 1 tablet (6.25 mg total) by mouth 2 (two) times daily with a meal. Okay to take one additional tablet daily as needed SBP > 150. (Patient not taking: Reported on 01/18/2016) 180 tablet 3  . cyanocobalamin 100 MCG tablet Take 100 mcg by mouth daily.    Marland Kitchen docusate sodium (COLACE) 100 MG capsule Take 1 capsule (100 mg total) by mouth daily. (Patient not taking: Reported on 01/11/2015) 30 capsule 0  . Homeopathic Products (SLEEP MEDICINE) TABS Take 1 tablet by mouth at bedtime.    Marland Kitchen HYDROcodone-acetaminophen (NORCO/VICODIN) 5-325 MG tablet Take 1-2 tablets by mouth every 6 (six) hours as needed for moderate pain or severe pain. (Patient not taking: Reported on 01/18/2016) 30 tablet 0   No current facility-administered medications for this visit.     ROS: See HPI for pertinent positives and negatives.   Physical Examination  Vitals:   01/18/16 1158 01/18/16 1204  BP: (!) 199/97 (!) 201/82  Pulse: 67   Resp: 18   Temp: 98.4 F (36.9 C)   TempSrc: Oral   SpO2: 99%   Weight: 81 lb 4.8 oz (36.9 kg)   Height: 5' 1.25" (1.556 m)    Body mass index is 15.24 kg/m.  General: A&O x 3, WDWN, thin female. Gait: slow and deliberate, unsteady Eyes: PERRLA. Pulmonary: CTAB, without wheezes , rales or rhonchi. Cardiac: regular rhythm, no detected murmur.         Carotid Bruits Right Left   Negative Negative  Aorta is mildly palpable (pt is quite thin). Radial pulses: 2+ bilaterally                           VASCULAR EXAM: Extremities without ischemic changes, without Gangrene; without open wounds.  LE Pulses Right Left        FEMORAL  2+ palpable  1+ palpable       POPLITEAL  not palpable  not palpable       POSTERIOR TIBIAL  not palpable  not palpable       DORSALIS PEDIS      ANTERIOR TIBIAL not palpable not palpable   Abdomen: soft, NT, no palpable masses. Skin: no rashes, no ulcers. Musculoskeletal: no muscle wasting or atrophy.                   Neurologic: A&O X 3; Appropriate Affect; MOTOR FUNCTION:  moving all extremities equally, motor strength 5/5 throughout. Speech is fluent/normal. CN 2-12 intact except has some hearing loss.     Non-Invasive Vascular Imaging: DATE: 01/18/2016 LOWER EXTREMITY ARTERIAL DUPLEX EVALUATION    INDICATION: Peripheral vascular disease     PREVIOUS INTERVENTION(S): Right distal external iliac to proximal superficial femoral artery bypass graft on 11/19/2007    DUPLEX EXAM:     RIGHT  LEFT   Peak Systolic Velocity (cm/s) Ratio (if abnormal) Waveform  Peak Systolic Velocity (cm/s) Ratio (if abnormal) Waveform  100  M  Inflow Artery     112  M Proximal Anastomosis     66  B Proximal Graft     21  B Mid Graft     25  B  Distal Graft     Occluded   Distal Anastomosis     Occluded    Outflow Artery     0.84 (falsely elevated) Today's ABI / TBI 0.53  0.39 Previous ABI / TBI (01/11/2015  ) 0.36    Waveform:    M - Monophasic       B - Biphasic       T - Triphasic  If Ankle Brachial Index (ABI) or Toe Brachial Index (TBI) performed, please see complete report     ADDITIONAL FINDINGS: Elevated velocity of 228 cm/s noted in the right proximal profunda femoral artery. Totally-occlusive plaque noted in the right distal anastomosis and proximal superficial femoral artery.  Impression noted on page two of this report.     IMPRESSION: Patent right proximal to distal bypass graft with a known occlusion of the distal anastomosis and native vessel. Flow from the patent segment of the bypass graft feeds into the profunda femoral artery.    Compared to the previous exam:  No  significant change in comparison to the last exam on 01/11/2015. Today's ankle-brachial indices are most likely elevated due to calcified plaque.      ASSESSMENT: Michelle Gaines is a 82 y.o. female who is s/p right external iliac to profunda femoris bypass performed in 2009 for  ischemia of the right leg. She fractured her left femur in February 2016 from falling, had an ORIF. She has left thigh pain with walking, does not seem to have claudication symptoms, no signs of ischemia of lower extremities. She may not walk enough to elicit claudication symptoms.  She is unsteady on her feet.  Today's right LE arterial Duplex suggests a patent right proximal to distal bypass graft with a known occlusion of the distal anastomosis and native vessel.  Flow from the patent segment of the bypass graft feeds into the profunda femoral artery with an elevated velocity, as described above. ABI's are falsely elevated due to known non compressible vessels which suggest medial calcification. All waveforms remain monophasic.  TBI's remain stable, right toe pressure is 71,  left is 64.  She has no tissue loss in her feet or lower legs.   I advised pt's husband with her today to drive her to her PCP's office now to address her asymptomatic uncontrolled hypertension. It is unclear whether she is taking her prescribed medications.    PLAN:  Based on the patient's vascular studies and examination, and after discussing with Dr. Oneida Alar, pt will return to clinic in 6 months with right LE arterial duplex and ABI's, see Dr. Donzetta Matters.  I discussed in depth with the patient the nature of atherosclerosis, and emphasized the importance of maximal medical management including strict control of blood pressure, blood glucose, and lipid levels, obtaining regular exercise, and continued cessation of smoking.  The patient is aware that without maximal medical management the underlying atherosclerotic disease process will progress,  limiting the benefit of any interventions.  The patient was given information about PAD including signs, symptoms, treatment, what symptoms should prompt the patient to seek immediate medical care, and risk reduction measures to take.  Clemon Chambers, RN, MSN, FNP-C [facility] Phone: [phone]  Clinic MD: Oneida Alar  01/18/16 12:08 PM

## 2016-02-02 ENCOUNTER — Other Ambulatory Visit: Payer: Self-pay | Admitting: Internal Medicine

## 2016-02-02 DIAGNOSIS — E041 Nontoxic single thyroid nodule: Secondary | ICD-10-CM

## 2016-02-10 ENCOUNTER — Ambulatory Visit
Admission: RE | Admit: 2016-02-10 | Discharge: 2016-02-10 | Disposition: A | Discharge status: Home / Self Care | Payer: Medicare Other | Source: Ambulatory Visit | Attending: Internal Medicine | Admitting: Internal Medicine

## 2016-02-10 DIAGNOSIS — E042 Nontoxic multinodular goiter: Secondary | ICD-10-CM | POA: Diagnosis not present

## 2016-02-10 DIAGNOSIS — E041 Nontoxic single thyroid nodule: Secondary | ICD-10-CM

## 2016-02-15 DIAGNOSIS — I70212 Atherosclerosis of native arteries of extremities with intermittent claudication, left leg: Secondary | ICD-10-CM | POA: Diagnosis not present

## 2016-02-15 DIAGNOSIS — I872 Venous insufficiency (chronic) (peripheral): Secondary | ICD-10-CM | POA: Diagnosis not present

## 2016-02-15 DIAGNOSIS — M25562 Pain in left knee: Secondary | ICD-10-CM | POA: Diagnosis not present

## 2016-03-27 ENCOUNTER — Ambulatory Visit: Payer: Self-pay

## 2016-03-29 ENCOUNTER — Ambulatory Visit: Payer: Self-pay

## 2016-04-03 DIAGNOSIS — D559 Anemia due to enzyme disorder, unspecified: Secondary | ICD-10-CM | POA: Diagnosis not present

## 2016-04-03 DIAGNOSIS — E785 Hyperlipidemia, unspecified: Secondary | ICD-10-CM | POA: Diagnosis not present

## 2016-04-03 DIAGNOSIS — E039 Hypothyroidism, unspecified: Secondary | ICD-10-CM | POA: Diagnosis not present

## 2016-05-10 ENCOUNTER — Other Ambulatory Visit: Payer: Self-pay | Admitting: *Deleted

## 2016-05-10 DIAGNOSIS — I739 Peripheral vascular disease, unspecified: Secondary | ICD-10-CM

## 2016-06-05 DIAGNOSIS — K5900 Constipation, unspecified: Secondary | ICD-10-CM | POA: Diagnosis not present

## 2016-06-27 ENCOUNTER — Ambulatory Visit (INDEPENDENT_AMBULATORY_CARE_PROVIDER_SITE_OTHER): Payer: Medicare Other

## 2016-06-27 ENCOUNTER — Telehealth: Payer: Self-pay

## 2016-06-27 ENCOUNTER — Encounter (INDEPENDENT_AMBULATORY_CARE_PROVIDER_SITE_OTHER): Payer: Self-pay | Admitting: Orthopaedic Surgery

## 2016-06-27 ENCOUNTER — Ambulatory Visit (INDEPENDENT_AMBULATORY_CARE_PROVIDER_SITE_OTHER): Payer: Medicare Other | Admitting: Orthopaedic Surgery

## 2016-06-27 VITALS — Ht 62.0 in | Wt 84.0 lb

## 2016-06-27 DIAGNOSIS — M7061 Trochanteric bursitis, right hip: Secondary | ICD-10-CM | POA: Diagnosis not present

## 2016-06-27 DIAGNOSIS — M25551 Pain in right hip: Secondary | ICD-10-CM | POA: Diagnosis not present

## 2016-06-27 DIAGNOSIS — M25561 Pain in right knee: Secondary | ICD-10-CM | POA: Diagnosis not present

## 2016-06-27 NOTE — Telephone Encounter (Signed)
-----   Message from Lujean Amel sent at 06/27/2016 10:53 AM EST ----- Regarding: Triage Call Wendy May w/ [employer] [phone] called regarding this patient. Dr.Mark Lorin Mercy is seeing her now and the patient is having some issues w/ R hip and knee pain. The Md believes it could be related to her vascular issues and would like her to be seen sooner than 07/27/16 which is her regularly scheduled follow up. Thanks, Anne Ng

## 2016-06-27 NOTE — Telephone Encounter (Addendum)
Ret'd call to [facility] @ Dr. Lorin Mercy office.  Reported that Dr. Lorin Mercy is requesting to have pt's Feb. 2nd appt. moved to an earlier date, to r/o any problems with the right leg bypass graft.  Reported the pt. was in their office today with c/o right hip to knee pain, and Dr. Lorin Mercy wants the bypass graft evaluated.  Per Abigail Butts, the pt. verbalized she doesn't feel like she is given enough information re: her condition, when she comes for appts. Was encouraged by Dr. Lorin Mercy to write questions down, and ask those questions, at her next appt.  Advised Abigail Butts will attempt to move pt's appt. to an earlier date.  Will contact the pt.

## 2016-06-27 NOTE — Progress Notes (Signed)
Office Visit Note   Patient: [name]           Date of Birth: 1929/04/04           MRN: [id number] Visit Date: 06/27/2016              Requested by: Levin Erp, MD [address], [place] [place], [place] [address] PCP: Criselda Peaches, MD   Assessment & Plan: Visit Diagnoses:  1. Right knee pain, unspecified chronicity   2. Pain in right hip   3. Trochanteric bursitis, right hip     Plan: With patient's pain in the right lateral hip/thigh and right knee I did offer doing a right greater trochanter bursa and right knee intra-articular Marcaine/Depo-Medrol injections but she declined.  Reassurance given that this should hopefully gradually improve on its own. Can use ice on the areas as needed. Will ollow up in office when necessary. If pain does not improve she will like to see with doing the injections she will let us know. Advised patient that with her history of previous right external iliac to profunda femoris bypass and sudden onset of right groin and thigh pain to her knee that we recommend that she be seen by vascular vein specialist of [place] today. We did contact their office and advised them to patient being seen in our clinic today and that we will be sending her over. She voices understanding.  Follow-Up Instructions: Return if symptoms worsen or fail to improve.   Orders:  Orders Placed This Encounter  Procedures  . XR HIP UNILAT W OR W/O PELVIS 2-3 VIEWS RIGHT  . XR Knee 1-2 Views Right  . XR FEMUR MIN 2 VIEWS LEFT   No orders of the defined types were placed in this encounter.     Procedures: No procedures performed   Clinical Data: No additional findings.   Subjective: Chief Complaint  Patient presents with  . Right Hip - Pain  . Right Knee - Pain    Patient comes in today with complaints of right knee and right hip pain since 06/22/2016. She states that she did not fall and does not know of any injury. The knee pain is all around  the knee. She states that sometimes when she walks, the pain radiates up into her thigh. She is ambulating with a walker. Sometimes she can walk without the walker and sometimes it is too painful to go without it. She states that the right hip aches. It also aches in her groin.   States that her pain with sudden onset more in the right lateral hip, mid thigh and right knee. No lumbar spine or radicular component.  States that she is aware that she does shift her weight to the right side due to ongoing issues with her left knee and left ankle. She's also status post left hip IM nail by Dr. Silvano Bilis 2016. Pain right lateral hip and thigh when she ambulates and lies on the right side. S/p right external iliac to profunda femoris bypass performed in 2009 for ischemia of the right leg Dr. Kellie Simmering. Last seen by vascular vein specialist July 2017.  Marland KitchenReview of Systems  Constitutional: Positive for activity change.  HENT: Negative.   Respiratory: Negative.   Cardiovascular: Negative.   Musculoskeletal: Positive for gait problem.     Objective: Vital Signs: Ht 5\' 2"  (1.575 m)   Wt 84 lb (38.1 kg)   BMI 15.36 kg/m   Physical Exam  Constitutional: She is oriented to person, place, and time. No distress.  HENT:  Head: Normocephalic and atraumatic.  Eyes: Pupils are equal, round, and reactive to light.  Pulmonary/Chest: No respiratory distress.  Musculoskeletal:  Patient ambulates with a walker. Negative logroll bilateral hips. Negative straight leg raise. She is mildly tender over the right hip greater trochanter bursa. Moderate to markedly tender over the right mid thigh.  Right knee good range of motion. No probable effusion. She is mildly tender at the joint line.  Calves are nontender.  Neurological: She is alert and oriented to person, place, and time.  Skin: Skin is warm and dry.  Psychiatric: She has a normal mood and affect.    Ortho Exam  Specialty Comments:  No specialty  comments available.  Imaging: Xr Femur Min 2 Views Left  Result Date: 06/27/2016 Two-view x-ray right femur do not see any obvious signs of fracture. Does have extensive calcification down the femoral artery. Impression no obvious signs of fracture. Peripheral artery disease.  Xr Hip Unilat W Or W/o Pelvis 2-3 Views Right  Result Date: 06/27/2016 AP pelvis and right hip shows some joint space narrowing. Nothing bone-on-bone. No obvious signs of fracture. Impression right hip DJD. No acute changes.  Xr Knee 1-2 Views Right  Result Date: 06/27/2016 X-ray right knee AP lateral views do show some foraminal narrowing but otherwise looks pretty good for patient's age. No signs of fracture. Impression right knee DJD. No acute changes.    PMFS History: Patient Active Problem List   Diagnosis Date Noted  . Ankle fracture 10/13/2015  . Closed left ankle fracture 10/12/2015  . GERD (gastroesophageal reflux disease) 10/12/2015  . Anxiety 10/12/2015  . Fall   . Hip fracture ([place]) 07/28/2014  . Protein-calorie malnutrition, severe ([place]) 07/28/2014  . Dizziness 04/24/2012  . Restless leg 03/15/2011  . Atrial flutter ([place]) 08/17/2010  . Essential hypertension 08/16/2010  . Coronary atherosclerosis 08/16/2010  . PVD 08/16/2010   Past Medical History:  Diagnosis Date  . Ankle fracture, left 09/2015  . Arthritis   . Atrial flutter ([place])   . Cancer ([place])    OVARIAN    1967  . Coronary artery disease   . Dysrhythmia    ATRIAL FLUTTER  . GERD (gastroesophageal reflux disease)   . Hypertension   . Peripheral arterial disease ([employer])   . PVD (peripheral vascular disease) ([facility])     Family History  Problem Relation Age of Onset  . COPD Father   . Hypertension Father     Past Surgical History:  Procedure Laterality Date  . ABDOMINAL HYSTERECTOMY    . ATRIAL FLUTTER ABLATION N/A 05/22/2013   Procedure: ATRIAL FLUTTER ABLATION;  Surgeon: Evans Lance, MD;  Location: [facility] CATH LAB;  Service:  Cardiovascular;  Laterality: N/A;  . BPG    . CAROTID STENT  11/30/09   LAD S/P Right CA DES  . FRACTURE SURGERY Left Feb. 2, 2016   Left Hip Fx   . GLIAYTE CATHERTER INSERTION  06/27/10  . INTRAMEDULLARY (IM) NAIL INTERTROCHANTERIC Left 07/28/2014   Procedure: Left Affixus Nail;  Surgeon: Marybelle Killings, MD;  Location: [place];  Service: Orthopedics;  Laterality: Left;  . PR VEIN BYPASS GRAFT,AORTO-FEM-POP     Right    Social History   Occupational History  . Not on file.   Social History Main Topics  . Smoking status: Never Smoker  . Smokeless tobacco: Never Used  . Alcohol use No  . Drug  use: No  . Sexual activity: Not on file

## 2016-06-27 NOTE — Telephone Encounter (Signed)
Pt moved to 1/12 with NP

## 2016-06-28 DIAGNOSIS — L298 Other pruritus: Secondary | ICD-10-CM | POA: Diagnosis not present

## 2016-06-28 DIAGNOSIS — L218 Other seborrheic dermatitis: Secondary | ICD-10-CM | POA: Diagnosis not present

## 2016-07-02 ENCOUNTER — Encounter: Payer: Self-pay | Admitting: Vascular Surgery

## 2016-07-06 ENCOUNTER — Ambulatory Visit (INDEPENDENT_AMBULATORY_CARE_PROVIDER_SITE_OTHER): Payer: Medicare Other | Admitting: Orthopaedic Surgery

## 2016-07-06 ENCOUNTER — Ambulatory Visit (INDEPENDENT_AMBULATORY_CARE_PROVIDER_SITE_OTHER): Payer: Medicare Other | Admitting: Family

## 2016-07-06 ENCOUNTER — Ambulatory Visit (HOSPITAL_COMMUNITY)
Admission: RE | Admit: 2016-07-06 | Discharge: 2016-07-06 | Disposition: A | Discharge status: Home / Self Care | Payer: Medicare Other | Source: Ambulatory Visit | Attending: Family | Admitting: Family

## 2016-07-06 ENCOUNTER — Ambulatory Visit (INDEPENDENT_AMBULATORY_CARE_PROVIDER_SITE_OTHER): Payer: Medicare Other

## 2016-07-06 ENCOUNTER — Encounter: Payer: Self-pay | Admitting: Family

## 2016-07-06 VITALS — BP 181/102 | HR 84 | Temp 99.0°F | Resp 16 | Ht 62.0 in | Wt 80.0 lb

## 2016-07-06 DIAGNOSIS — M79651 Pain in right thigh: Secondary | ICD-10-CM

## 2016-07-06 DIAGNOSIS — Y832 Surgical operation with anastomosis, bypass or graft as the cause of abnormal reaction of the patient, or of later complication, without mention of misadventure at the time of the procedure: Secondary | ICD-10-CM | POA: Insufficient documentation

## 2016-07-06 DIAGNOSIS — I10 Essential (primary) hypertension: Secondary | ICD-10-CM | POA: Insufficient documentation

## 2016-07-06 DIAGNOSIS — M25551 Pain in right hip: Secondary | ICD-10-CM

## 2016-07-06 DIAGNOSIS — I739 Peripheral vascular disease, unspecified: Secondary | ICD-10-CM

## 2016-07-06 DIAGNOSIS — M545 Low back pain: Secondary | ICD-10-CM

## 2016-07-06 DIAGNOSIS — T82858A Stenosis of vascular prosthetic devices, implants and grafts, initial encounter: Secondary | ICD-10-CM | POA: Insufficient documentation

## 2016-07-06 DIAGNOSIS — R0989 Other specified symptoms and signs involving the circulatory and respiratory systems: Secondary | ICD-10-CM | POA: Diagnosis present

## 2016-07-06 DIAGNOSIS — Z95828 Presence of other vascular implants and grafts: Secondary | ICD-10-CM | POA: Diagnosis not present

## 2016-07-06 DIAGNOSIS — R938 Abnormal findings on diagnostic imaging of other specified body structures: Secondary | ICD-10-CM | POA: Diagnosis not present

## 2016-07-06 DIAGNOSIS — M25561 Pain in right knee: Secondary | ICD-10-CM | POA: Diagnosis not present

## 2016-07-06 DIAGNOSIS — I779 Disorder of arteries and arterioles, unspecified: Secondary | ICD-10-CM | POA: Diagnosis not present

## 2016-07-06 NOTE — Patient Instructions (Signed)

## 2016-07-06 NOTE — Progress Notes (Signed)
Office Visit Note   Patient: Michelle Gaines           Date of Birth: 15-May-1929           MRN: [id number] Visit Date: 07/06/2016              Requested by: Levin Erp, MD [address] [place] [place], [place] [address] PCP: Criselda Peaches, MD   Assessment & Plan: Visit Diagnoses:  1. Right low back pain, unspecified chronicity, with sciatica presence unspecified   2. PAD (peripheral artery disease) ([place])     Plan: Patient has claudication symptoms and has to stop and rest. She uses a walker except when she is in her kitchen. Her arterial Doppler showed decreased flow and she has no palpable pulses. She should walk until she starts having symptoms and then she can sit rest and then repeat to help improve collateral flow. Lumbar x-ray results were discussed with her.  Follow-Up Instructions: Return if symptoms worsen or fail to improve.   Orders:  Orders Placed This Encounter  Procedures  . XR Lumbar Spine 2-3 Views   No orders of the defined types were placed in this encounter.     Procedures: No procedures performed   Clinical Data: No additional findings.   Subjective: Chief Complaint  Patient presents with  . Right Knee - Pain    Patient comes in after appt with Vein and Vascular today and states they cannot do anything to help her. She continues to have pain in the right leg and knee and is unable to rest.   Patient has no palpable pulses in the lower extremity. Feet are somewhat cool she has no plantar foot lesions. Old deformity of the left ankle from previous ankle fracture. Her Doppler studies suggest she has occlusion at the distal aspect of the graft where it was anastomosed her native artery femoral bypass. Patient her husband is with her today for additional history states she was never smoker.  Review of Systems 14 port via systems updated and is unchanged from 06/27/2016 office visit.   Objective: Vital Signs: There were no vitals taken  for this visit.  Physical Exam  Constitutional: She is oriented to person, place, and time. She appears well-developed.  Patient's thin frail elderly , alert and pleasant.  HENT:  Head: Normocephalic.  Right Ear: External ear normal.  Left Ear: External ear normal.  Eyes: Pupils are equal, round, and reactive to light.  Neck: No tracheal deviation present. No thyromegaly present.  Cardiovascular: Normal rate.   Pulmonary/Chest: Effort normal.  Abdominal: Soft.  Musculoskeletal:  Patient has no pain with hip range of motion. Chest some sciatic notch tenderness bilateral trochanteric bursal tenderness no knee effusion. The ligamentous exam is normal. Shows a left femoral interlocking nail present on the left side. Left ankle deformity from bimalleolar ankle fracture. No pedal pulses are present in her feet. Both feet are cool but there is no ulceration. He'll scars the left femoral line with no cellulitis.  Neurological: She is alert and oriented to person, place, and time.  Skin: Skin is warm and dry. Capillary refill takes more than 3 seconds.  Psychiatric: She has a normal mood and affect. Her behavior is normal.    Ortho Exam  Specialty Comments:  No specialty comments available.  Imaging: Xr Lumbar Spine 2-3 Views  Result Date: 07/06/2016 Two-view x-rays lumbar spine obtained. This showed calcification of the abdominal aorta and common iliac arteries. Patient has  some scoliosis endplate spurring. No acute compression fractures are visualized. No spondylolisthesis. Impression: Lumbar curvature. Spurs at L5-S1. Arterial calcification.    PMFS History: Patient Active Problem List   Diagnosis Date Noted  . Ankle fracture 10/13/2015  . Closed left ankle fracture 10/12/2015  . GERD (gastroesophageal reflux disease) 10/12/2015  . Anxiety 10/12/2015  . Fall   . Hip fracture ([place]) 07/28/2014  . Protein-calorie malnutrition, severe ([place]) 07/28/2014  . Dizziness 04/24/2012  .  Restless leg 03/15/2011  . Atrial flutter ([place]) 08/17/2010  . Essential hypertension 08/16/2010  . Coronary atherosclerosis 08/16/2010  . PVD 08/16/2010   Past Medical History:  Diagnosis Date  . Ankle fracture, left 09/2015  . Arthritis   . Atrial flutter ([place])   . Cancer ([place])    OVARIAN    1967  . Coronary artery disease   . Dysrhythmia    ATRIAL FLUTTER  . GERD (gastroesophageal reflux disease)   . Hypertension   . Peripheral arterial disease ([place])   . PVD (peripheral vascular disease) ([facility])     Family History  Problem Relation Age of Onset  . COPD Father   . Hypertension Father     Past Surgical History:  Procedure Laterality Date  . ABDOMINAL HYSTERECTOMY    . ATRIAL FLUTTER ABLATION N/A 05/22/2013   Procedure: ATRIAL FLUTTER ABLATION;  Surgeon: Evans Lance, MD;  Location: [facility] C[facility] LAB;  Service: Cardiovascular;  Laterality: N/A;  . BPG    . CAROTID STENT  11/30/09   LAD S/P Right CA DES  . FRACTURE SURGERY Left Feb. 2, 2016   Left Hip Fx   . GLIAYTE CATHERTER INSERTION  06/27/10  . INTRAMEDULLARY (IM) NAIL INTERTROCHANTERIC Left 07/28/2014   Procedure: Left Affixus Nail;  Surgeon: Marybelle Killings, MD;  Location: [place];  Service: Orthopedics;  Laterality: Left;  . PR VEIN BYPASS GRAFT,AORTO-FEM-POP     Right    Social History   Occupational History  . Not on file.   Social History Main Topics  . Smoking status: Never Smoker  . Smokeless tobacco: Never Used  . Alcohol use No  . Drug use: No  . Sexual activity: Not on file

## 2016-07-06 NOTE — Progress Notes (Signed)
[facility] [place]   CC: Follow up peripheral artery occlusive disease  History of Present Illness Michelle Gaines is a 83 y.o. female patient of Dr. Kellie Simmering who is s/p right external iliac to profunda femoris bypass performed in 2009 for ischemia of the right leg. She has chronic occlusion of the SFA and bypass graft with good flow into the profunda on the right.   She continues to have no specific symptoms of rest pain infection or nonhealing ulcers.   She returns today at the request of Dr. Lorin Mercy re 1 week hx of right knee, anterior thigh, and hip pain, aggravated by laying in bed.  She had left thigh pain with walking since her left femur fracture and ORIF in February 2016, but that had resolved.  She does not indicate claudication type pain with walking.  She is performing daily seated leg exercises as instructed by physical therapy which she feels helps her legs.  Pt states she has always been thin, her appetite is good. Pt denies any history of stroke or TIA.   Pt Diabetic: Yes Pt smoker: non-smoker  Pt meds include: Statin :No, states she does not want to take Betablocker: Yes ASA: No Other anticoagulants/antiplatelets: no     Past Medical History:  Diagnosis Date  . Ankle fracture, left 09/2015  . Arthritis   . Atrial flutter ([place])   . Cancer ([place])    OVARIAN    1967  . Coronary artery disease   . Dysrhythmia    ATRIAL FLUTTER  . GERD (gastroesophageal reflux disease)   . Hypertension   . Peripheral arterial disease ([place])   . PVD (peripheral vascular disease) ([place])     Social History Social History  Substance Use Topics  . Smoking status: Never Smoker  . Smokeless tobacco: Never Used  . Alcohol use No    Family History Family History  Problem Relation Age of Onset  . COPD Father   . Hypertension Father     Past Surgical History:  Procedure Laterality Date  . ABDOMINAL HYSTERECTOMY    . ATRIAL FLUTTER ABLATION N/A  05/22/2013   Procedure: ATRIAL FLUTTER ABLATION;  Surgeon: Evans Lance, MD;  Location: [facility] CATH LAB;  Service: Cardiovascular;  Laterality: N/A;  . BPG    . CAROTID STENT  11/30/09   LAD S/P Right CA DES  . FRACTURE SURGERY Left Feb. 2, 2016   Left Hip Fx   . GLIAYTE CATHERTER INSERTION  06/27/10  . INTRAMEDULLARY (IM) NAIL INTERTROCHANTERIC Left 07/28/2014   Procedure: Left Affixus Nail;  Surgeon: Marybelle Killings, MD;  Location: [facility];  Service: Orthopedics;  Laterality: Left;  . PR VEIN BYPASS GRAFT,AORTO-FEM-POP     Right     Allergies  Allergen Reactions  . Fexofenadine Shortness Of Breath  . Cephalosporins Rash  . Diltiazem Other (See Comments)    Dizziness   . Penicillins Nausea And Vomiting, Swelling and Rash  . Amlodipine Other (See Comments)  . Anaprox [Naproxen Sodium] Other (See Comments)  . Benzonatate Other (See Comments)  . Cephalexin Other (See Comments)  . Clindamycin/Lincomycin Other (See Comments)  . Clorazepate Other (See Comments)  . Codeine Other (See Comments)  . Doxycycline Other (See Comments)  . Hydralazine Other (See Comments)  . Labetalol Other (See Comments)  . Lisinopril Other (See Comments)  . Phenylephrine Other (See Comments)  . Phenytoin Other (See Comments)  . Zolpidem Other (See Comments)    Current Outpatient Prescriptions  Medication Sig Dispense Refill  . aspirin (BAYER ASPIRIN) 325 MG tablet Take 1 tablet (325 mg total) by mouth daily. 28 tablet 0  . carvedilol (COREG) 6.25 MG tablet Take 1 tablet (6.25 mg total) by mouth 2 (two) times daily with a meal. Okay to take one additional tablet daily as needed SBP > 150. 180 tablet 3  . cyanocobalamin 100 MCG tablet Take 100 mcg by mouth daily.    Marland Kitchen docusate sodium (COLACE) 100 MG capsule Take 1 capsule (100 mg total) by mouth daily. 30 capsule 0  . Flaxseed, Linseed, (FLAX SEED OIL PO) Take 1 tablet by mouth daily.    Marland Kitchen LORazepam (ATIVAN) 0.5 MG tablet Take 0.5 mg by mouth at bedtime.     .  Lutein 20 MG TABS Take 20 mg by mouth daily.    . magnesium gluconate (MAGONATE) 500 MG tablet Take 500 mg by mouth 2 (two) times daily.      . multivitamin (THERAGRAN) per tablet Take 1 tablet by mouth daily.      . nitroGLYCERIN (NITROSTAT) 0.4 MG SL tablet Place 1 tablet (0.4 mg total) under the tongue every 5 (five) minutes as needed for chest pain. 25 tablet 12  . Potassium 99 MG TABS Take 1 tablet by mouth daily.      Marland Kitchen Ubiquinol 50 MG CAPS Take 50 mg by mouth 2 (two) times daily.    Marland Kitchen VITAMIN D, CHOLECALCIFEROL, PO Take 1,000 mg by mouth daily.     . vitamin E 400 UNIT capsule Take 400 Units by mouth daily.    Marland Kitchen BETA CAROTENE PO Take 1 tablet by mouth daily.    . Homeopathic Products (SLEEP MEDICINE) TABS Take 1 tablet by mouth at bedtime.    Marland Kitchen HYDROcodone-acetaminophen (NORCO/VICODIN) 5-325 MG tablet Take 1-2 tablets by mouth every 6 (six) hours as needed for moderate pain or severe pain. (Patient not taking: Reported on 07/06/2016) 30 tablet 0   No current facility-administered medications for this visit.     ROS: See HPI for pertinent positives and negatives.   Physical Examination  Vitals:   07/06/16 1005 07/06/16 1012 07/06/16 1013  BP: (!) 189/110 (!) 200/107 (!) 181/102  Pulse: 84    Resp: 16    Temp: 99 F (37.2 C)    TempSrc: Oral    SpO2: 98%    Weight: 80 lb (36.3 kg)    Height: 5\' 2"  (1.575 m)     Body mass index is 14.63 kg/m.  General: A&O x 3, WDWN, thin female. Gait: slow and deliberate, unsteady, walking with a walker Eyes: PERRLA. Pulmonary: Respirations are non labored, CTAB, without wheezes, rales or rhonchi. Cardiac: regular rhythm, no detected murmur.    Carotid Bruits Right Left   Negative Negative  Aorta is mildly palpable (pt is quite thin). Radial pulses: 2+ bilaterally   VASCULAR EXAM: Extremitieswithoutischemic changes, withoutGangrene; withoutopen wounds.  LE Pulses Right Left  FEMORAL  2+ palpable 2+ palpable  POPLITEAL not palpable not palpable  POSTERIOR TIBIAL not palpable not palpable  DORSALIS PEDIS ANTERIOR TIBIAL not palpable not palpable   Abdomen: soft, NT, no palpable masses. Skin: no rashes, no ulcers. Musculoskeletal: no muscle wasting or atrophy. Neurologic: A&O X 3; Appropriate Affect; MOTOR FUNCTION: moving all extremities equally, motor strength 5/5 throughout. Speech is fluent/normal. CN 2-12 intact except has some hearing loss.     ASSESSMENT: Michelle Gaines is a 83 y.o. female who is s/pright external iliac to profunda femoris bypass  performed in 2009 for ischemia of the right leg. She has chronic occlusion of the SFA and bypass graft with good flow into the profunda on the right.  She fractured her left femur in February 2016 from falling, had an ORIF.  She has right knee, anterior thigh, and right hip pain for the last week, aggravated by lying supine, does not seem to have claudication symptoms, no signs of ischemia of lower extremities. She may not walk enough to elicit claudication symptoms.  She is unsteady on her feet.  Dr. Donzetta Matters spoke with and examined pt, reviewed her previous and today's vascular studies results.  Her arterial perfusion in her right iliac to right leg has not changed in 3 years, bilateral toe brachial indices have imrpoved.  Consider neurological or muscoskeletal etiology, defer to Dr. Lorin Mercy and Dr. Nyoka Cowden.  I spoke with Abigail Butts in Dr. Rolly Salter office, pt's PCP, and advised her of pt's blood pressure, she advised that pt come to their office now. I reinforced to pt and her husband to go to Dr. Rolly Salter office now.     DATA Today's right LE arterial Duplex demonstrates known occlusion of the distal anastomosis and proximal femoral artery .  Patent portion of the bypass graft is feeding the profunda femoral artery. No significant change compared to the last exam on  01-18-16.  ABI's: Right: 0.67 (was 0.84 and falsely elevated on 01-18-16, did not correspond with monophasic waveforms), monophasic waveforms; TBI: 0.49 (improved from 0.39 on 01-18-16). Left: 0.52 (was 0.53), monophasic waveforms; TBI: 0.55 (improved from 0.36)   PLAN:  I advised pt go to her PCP's office now to address her uncontrolled blood pressure.   Based on the patient's vascular studies and examination, pt will return to clinic in 3 months with ABI's and right LE arterial duplex.   I discussed in depth with the patient the nature of atherosclerosis, and emphasized the importance of maximal medical management including strict control of blood pressure, blood glucose, and lipid levels, obtaining regular exercise, and continued cessation of smoking.  The patient is aware that without maximal medical management the underlying atherosclerotic disease process will progress, limiting the benefit of any interventions.  The patient was given information about PAD including signs, symptoms, treatment, what symptoms should prompt the patient to seek immediate medical care, and risk reduction measures to take.  Clemon Chambers, RN, MSN, FNP-C [facility] Phone: [phone]  Clinic MD: Donzetta Matters  07/06/16 10:31 AM

## 2016-07-13 ENCOUNTER — Telehealth (INDEPENDENT_AMBULATORY_CARE_PROVIDER_SITE_OTHER): Payer: Self-pay | Admitting: Orthopaedic Surgery

## 2016-07-13 DIAGNOSIS — M545 Low back pain: Secondary | ICD-10-CM

## 2016-07-13 NOTE — Telephone Encounter (Signed)
Order MRI lumbar back and right leg pain .  ROV after scan.  I called and discussed with her.

## 2016-07-13 NOTE — Telephone Encounter (Signed)
Order entered. Patient called and was informed to make ROV for after scan.

## 2016-07-13 NOTE — Telephone Encounter (Signed)
Please advise. You were going to look at lumbar spine x-rays and call patient.

## 2016-07-17 ENCOUNTER — Other Ambulatory Visit (INDEPENDENT_AMBULATORY_CARE_PROVIDER_SITE_OTHER): Payer: Self-pay | Admitting: Orthopaedic Surgery

## 2016-07-23 ENCOUNTER — Ambulatory Visit
Admission: RE | Admit: 2016-07-23 | Discharge: 2016-07-23 | Disposition: A | Discharge status: Home / Self Care | Payer: Medicare Other | Source: Ambulatory Visit | Attending: Orthopaedic Surgery | Admitting: Orthopaedic Surgery

## 2016-07-23 DIAGNOSIS — M545 Low back pain: Secondary | ICD-10-CM

## 2016-07-23 DIAGNOSIS — M48061 Spinal stenosis, lumbar region without neurogenic claudication: Secondary | ICD-10-CM | POA: Diagnosis not present

## 2016-07-27 ENCOUNTER — Other Ambulatory Visit (HOSPITAL_COMMUNITY): Payer: Medicare Other

## 2016-07-27 ENCOUNTER — Encounter (HOSPITAL_COMMUNITY): Payer: Medicare Other

## 2016-07-27 ENCOUNTER — Ambulatory Visit: Payer: Medicare Other | Admitting: Vascular Surgery

## 2016-07-27 ENCOUNTER — Telehealth (INDEPENDENT_AMBULATORY_CARE_PROVIDER_SITE_OTHER): Payer: Self-pay | Admitting: Orthopaedic Surgery

## 2016-07-27 DIAGNOSIS — G8929 Other chronic pain: Secondary | ICD-10-CM

## 2016-07-27 DIAGNOSIS — M5441 Lumbago with sciatica, right side: Principal | ICD-10-CM

## 2016-07-27 NOTE — Telephone Encounter (Signed)
Order entered. Dr. Lorin Mercy would like for patient to be called on Monday if possible.

## 2016-07-27 NOTE — Telephone Encounter (Signed)
I called . Set up for one ESI with [facility] L4-5 thanks  rov with me after ESI

## 2016-07-27 NOTE — Telephone Encounter (Signed)
I called left message , will recall later today

## 2016-07-27 NOTE — Telephone Encounter (Signed)
PATIENT CALLED WANTING TO GET SOME ADVICE ON HOW TO MANAGE THE PAIN IN HER RIGHT LEG/HIP. ALSO WANTING TO KNOW IF DR YATES HAS REVIEWED HER MRI RESULTS. CB # [phone]

## 2016-07-27 NOTE — Addendum Note (Signed)
Addended by: Meyer Cory on: 07/27/2016 05:16 PM   Modules accepted: Orders

## 2016-07-27 NOTE — Telephone Encounter (Signed)
Please advise 

## 2016-07-27 NOTE — Telephone Encounter (Signed)
Set up for ESI right L4-5   [facility].   ROV after scan

## 2016-08-01 ENCOUNTER — Ambulatory Visit (INDEPENDENT_AMBULATORY_CARE_PROVIDER_SITE_OTHER): Payer: Medicare Other | Admitting: Physical Medicine and Rehabilitation

## 2016-08-01 ENCOUNTER — Telehealth (INDEPENDENT_AMBULATORY_CARE_PROVIDER_SITE_OTHER): Payer: Self-pay | Admitting: Physical Medicine and Rehabilitation

## 2016-08-01 ENCOUNTER — Encounter (INDEPENDENT_AMBULATORY_CARE_PROVIDER_SITE_OTHER): Payer: Self-pay | Admitting: Physical Medicine and Rehabilitation

## 2016-08-01 VITALS — BP 191/110 | HR 61

## 2016-08-01 DIAGNOSIS — M5416 Radiculopathy, lumbar region: Secondary | ICD-10-CM

## 2016-08-01 DIAGNOSIS — M5116 Intervertebral disc disorders with radiculopathy, lumbar region: Secondary | ICD-10-CM | POA: Diagnosis not present

## 2016-08-01 NOTE — Progress Notes (Signed)
TASHYRA CIPOLLONE - 83 y.o. female MRN [id number]  Date of birth: Oct 25, 1928  Office Visit Note: Visit Date: 08/01/2016 PCP: Criselda Peaches, MD Referred by: Levin Erp, MD  Subjective: Chief Complaint  Patient presents with  . Lower Back - Pain  . Right Leg - Pain   HPI: Mrs. Fickes is an 83 year old female that comes in today with her husband. She is been followed by Dr. Lorin Mercy for her low back and leg pain. Her biggest complaint is the right hip and leg that traverses the anterior thigh across the knee medially into the medial ankle. This is in a fairly classic L4 distribution. She also reports cramping in the big toe at times. She was initially seen by vascular surgery,Suzanne Nickel, RN/FNP and Dr. Donzetta Matters with reports of similar symptoms. Through their workup that showed no pulses distally past the femoral artery pulses. They reported this was unchanged from prior. They want to follow up with her in 3 months for more studies. They felt like the pain she was having maybe more neurogenic and they referred her back to Dr. Lorin Mercy. An MRI of the lumbar spine was obtained and this is reviewed below. This shows effacement of fat surrounding the right L4 nerve root which is likely from a extruded foraminal disc. I discussed at length with her and her husband that given the information from the vascular surgeons as well as the MRI and now her symptoms today that this fits with an L4 radiculopathy and tried to explain how that works with nerve roots in more the pain can extend in the leg without much back pain. Unfortunately she perseverates over vascular problems of blood flow of morning her blood flow to be normal. She does not report any specific injury. She denies any focal weakness. She does ambulate with a walker. She is very uncomfortable and is in a lot of pain radiates down to the medial ankle and is really were a lot of her pain is. She does have soreness on the right posterior hip and lateral  hip. She has difficulty laying back gives her some pain. She has to this medication would be a steroid and that she is adamant she does not want any steroid medications.    Review of Systems  Constitutional: Negative for chills, fever, malaise/fatigue and weight loss.  HENT: Negative for hearing loss and sinus pain.   Eyes: Negative for blurred vision, double vision and photophobia.  Respiratory: Negative for cough and shortness of breath.   Cardiovascular: Positive for claudication and leg swelling. Negative for chest pain and palpitations.  Gastrointestinal: Negative for abdominal pain, nausea and vomiting.  Genitourinary: Negative for flank pain.  Musculoskeletal: Positive for back pain. Negative for myalgias.  Skin: Negative for itching and rash.  Neurological: Negative for tremors, focal weakness and weakness.  Endo/Heme/Allergies: Negative.   Psychiatric/Behavioral: Negative for depression.  All other systems reviewed and are negative.  Otherwise per HPI.  Assessment & Plan: Visit Diagnoses:  1. Lumbar radiculopathy   2. Radiculopathy due to lumbar intervertebral disc disorder     Plan: Findings:  Chronic worsening severe right in my mind radicular L4 pain down to the medial ankle. This is concordant pain with MRI showing likely extruded disc with effacement of the surrounding fat in the foramen and laterally at L4 on the right. The rest of her spine has other small changes but nothing as specific as this one lesion. I explained this to her and her husband at  length today. At first she does want to be referred back to the vascular surgeons to be evaluated for her blood flow and this is something she perseverates over. After thinking about it and further explanation of what corticosteroids are she does want to proceed with an injection diagnostically and therapeutically. We will get her scheduled for this. I spent more than 25 minutes speaking face-to-face with the patient with 50% of  the time in counseling.    Meds & Orders: No orders of the defined types were placed in this encounter.  No orders of the defined types were placed in this encounter.   Follow-up: Return for Right L4 transforaminal epidural steroid injection.   Procedures: No procedures performed  No notes on file   Clinical History: IMPRESSION: 1. Effacement of the fat surrounding the right L4 nerve root in the foramen at L4-5. This may contribute to the patient's right lower extremity symptoms. The cause for this is not entirely certain, although a right foraminal disc extrusion is suspected based on axial T1 weighted images. 2. No other significant spinal stenosis or nerve root encroachment. Mild spondylosis at the other levels as described. 3. Chronic Schmorl's nodes without acute osseous findings. 4. Scattered small marrow lesions, some of which are indeterminate. These are favored to be benign, potentially atypical hemangiomas. Follow-up MRI could be performed in 4-6 months to document stability.   Electronically Signed   By: Richardean Sale M.D.   On: 07/23/2016 13:03  She reports that she has never smoked. She has never used smokeless tobacco. No results for input(s): HGBA1C, LABURIC in the last 8760 hours.  Objective:  VS:  HT:    WT:   BMI:     BP:(!) 191/110  HR:61bpm  TEMP: ( )  RESP:  Physical Exam  Constitutional: She is oriented to person, place, and time. She appears well-developed and well-nourished.  Eyes: Conjunctivae and EOM are normal. Pupils are equal, round, and reactive to light.  Cardiovascular: Normal rate.   Distal pulses at the dorsalis pedis were absent.  Pulmonary/Chest: Effort normal.  Musculoskeletal:  She ambulates with a walker. She has good distal strength and mild weakness with knee extension on the right compared to left. Still 4 out of 5 or more. She has no clonus bilaterally. No pain over the greater trochanter.  Neurological: She is alert and  oriented to person, place, and time. A sensory deficit is present. She exhibits normal muscle tone. Coordination normal.  Decreased sensation to light touch in an L4 dermatomal pattern on the right.  Skin: Skin is warm and dry. No rash noted. No erythema.  Psychiatric: She has a normal mood and affect.  She does some some perseveration and judgment is heart is it's hard to get her neurologically C what were trying to do.  Nursing note and vitals reviewed.   Ortho Exam Imaging: No results found.  Past Medical/Family/Surgical/Social History: Medications & Allergies reviewed per EMR Patient Active Problem List   Diagnosis Date Noted  . Ankle fracture 10/13/2015  . Closed left ankle fracture 10/12/2015  . GERD (gastroesophageal reflux disease) 10/12/2015  . Anxiety 10/12/2015  . Fall   . Hip fracture ([place]) 07/28/2014  . Protein-calorie malnutrition, severe ([place]) 07/28/2014  . Dizziness 04/24/2012  . Restless leg 03/15/2011  . Atrial flutter ([place]) 08/17/2010  . Essential hypertension 08/16/2010  . Coronary atherosclerosis 08/16/2010  . PVD 08/16/2010   Past Medical History:  Diagnosis Date  . Ankle fracture, left 09/2015  .  Arthritis   . Atrial flutter ([place])   . Cancer ([place])    OVARIAN    1967  . Coronary artery disease   . Dysrhythmia    ATRIAL FLUTTER  . GERD (gastroesophageal reflux disease)   . Hypertension   . Peripheral arterial disease ([place])   . PVD (peripheral vascular disease) ([facility])    Family History  Problem Relation Age of Onset  . COPD Father   . Hypertension Father    Past Surgical History:  Procedure Laterality Date  . ABDOMINAL HYSTERECTOMY    . ATRIAL FLUTTER ABLATION N/A 05/22/2013   Procedure: ATRIAL FLUTTER ABLATION;  Surgeon: Evans Lance, MD;  Location: [facility] CATH LAB;  Service: Cardiovascular;  Laterality: N/A;  . BPG    . CAROTID STENT  11/30/09   LAD S/P Right CA DES  . FRACTURE SURGERY Left Feb. 2, 2016   Left Hip Fx   . GLIAYTE CATHERTER  INSERTION  06/27/10  . INTRAMEDULLARY (IM) NAIL INTERTROCHANTERIC Left 07/28/2014   Procedure: Left Affixus Nail;  Surgeon: Marybelle Killings, MD;  Location: [facility];  Service: Orthopedics;  Laterality: Left;  . PR VEIN BYPASS GRAFT,AORTO-FEM-POP     Right    Social History   Occupational History  . Not on file.   Social History Main Topics  . Smoking status: Never Smoker  . Smokeless tobacco: Never Used  . Alcohol use No  . Drug use: No  . Sexual activity: Not on file

## 2016-08-02 ENCOUNTER — Encounter (INDEPENDENT_AMBULATORY_CARE_PROVIDER_SITE_OTHER): Payer: Self-pay | Admitting: Physical Medicine and Rehabilitation

## 2016-08-06 ENCOUNTER — Encounter (INDEPENDENT_AMBULATORY_CARE_PROVIDER_SITE_OTHER): Payer: Medicare Other | Admitting: Physical Medicine and Rehabilitation

## 2016-08-07 ENCOUNTER — Ambulatory Visit (INDEPENDENT_AMBULATORY_CARE_PROVIDER_SITE_OTHER): Payer: Medicare Other | Admitting: Physical Medicine and Rehabilitation

## 2016-08-07 ENCOUNTER — Encounter (INDEPENDENT_AMBULATORY_CARE_PROVIDER_SITE_OTHER): Payer: Self-pay | Admitting: Physical Medicine and Rehabilitation

## 2016-08-07 ENCOUNTER — Ambulatory Visit (INDEPENDENT_AMBULATORY_CARE_PROVIDER_SITE_OTHER): Payer: Self-pay

## 2016-08-07 VITALS — BP 202/101 | HR 89

## 2016-08-07 DIAGNOSIS — M5416 Radiculopathy, lumbar region: Secondary | ICD-10-CM

## 2016-08-07 MED ORDER — LIDOCAINE HCL (PF) 1 % IJ SOLN
0.3300 mL | Freq: Once | INTRAMUSCULAR | Status: AC
  Administered 2016-08-07: 0.3 mL

## 2016-08-07 MED ORDER — METHYLPREDNISOLONE ACETATE 80 MG/ML IJ SUSP
80.0000 mg | Freq: Once | INTRAMUSCULAR | Status: AC
  Administered 2016-08-07: 80 mg

## 2016-08-07 NOTE — Patient Instructions (Signed)

## 2016-08-07 NOTE — Progress Notes (Signed)
Michelle Gaines - 83 y.o. female MRN [id number]  Date of birth: February 12, 1929  Office Visit Note: Visit Date: 08/07/2016 PCP: Criselda Peaches, MD Referred by: Levin Erp, MD  Subjective: Chief Complaint  Patient presents with  . Lower Back - Pain   HPI: Michelle Gaines is an 83 year old female presents with her husband today who is here today for planned right L4 transforaminal injection. No change in symptoms. She continues to have what I feel like his radicular pain in an L4 distribution through the knee with a lot of knee pain down to the medial ankle. MRI evidence of disc herniation focally in the foramen and laterally at L4. She will follow-up with Dr. Lorin Mercy. She still has a lot of questions about her current condition and vascular condition which I will defer to him.    ROS Otherwise per HPI.  Assessment & Plan: Visit Diagnoses:  1. Lumbar radiculopathy     Plan: Findings:  Right L4 transforaminal epidural steroid injection. Patient may have had a little bit of relief during the anesthetic portion of the injection.    Meds & Orders:  Meds ordered this encounter  Medications  . lidocaine (PF) (XYLOCAINE) 1 % injection 0.3 mL  . methylPREDNISolone acetate (DEPO-MEDROL) injection 80 mg    Orders Placed This Encounter  Procedures  . XR C-ARM NO REPORT  . Epidural Steroid injection    Follow-up: Return for Dr. Lorin Mercy scheduled.   Procedures: No procedures performed  Lumbosacral Transforaminal Epidural Steroid Injection - Infraneural Approach with Fluoroscopic Guidance  Patient: Michelle Gaines      Date of Birth: 03-09-1929 MRN: [id number] PCP: Criselda Peaches, MD      Visit Date: 08/07/2016   Universal Protocol:    Date/Time: 02/14/185:54 AM  Consent Given By: the patient  Position: PRONE   Additional Comments: Vital signs were monitored before and after the procedure. Patient was prepped and draped in the usual sterile fashion. The correct patient,  procedure, and site was verified.   Injection Procedure Details:  Procedure Site One Meds Administered:  Meds ordered this encounter  Medications  . lidocaine (PF) (XYLOCAINE) 1 % injection 0.3 mL  . methylPREDNISolone acetate (DEPO-MEDROL) injection 80 mg      Laterality: Right  Location/Site:  L4-L5  Needle size: 22 G  Needle type: Spinal  Needle Placement: Transforaminal  Findings:  -Contrast Used: 2 mL iohexol 180 mg iodine/mL   -Comments: Excellent flow of contrast along the nerve and into the epidural space. Initial placement was difficult due to oblique views being fairly obscured with vascular grafting and pretty significant osteopenia and the patient is very slight. We initially needle down to actually closer to the L3 foramen. We did repositioning get a different approach ankle and did complete the injection successfully on the right L4 transforaminal approach with good epidural flow. The patient had concordant pain symptoms during the injection.  Procedure Details: After squaring off the end-plates of the desired vertebral level to get a true AP view, the C-arm was obliqued to the painful side so that the superior articulating process is positioned about 1/3 the length of the inferior endplate.  The needle was aimed toward the junction of the superior articular process and the transverse process of the inferior vertebrae. The needle's initial entry is in the lower third of the foramen through Kambin's triangle. The soft tissues overlying this target were infiltrated with 2-3 ml. of 1% Lidocaine without Epinephrine.  The spinal needle was  then inserted and advanced toward the target using a "trajectory" view along the fluoroscope beam.  Under AP and lateral visualization, the needle was advanced so it did not puncture dura and did not traverse medially beyond the 6 o'clock position of the pedicle. Bi-planar projections were used to confirm position. Aspiration was confirmed to  be negative for CSF and/or blood. A 1-2 ml. volume of Isovue-250 was injected and flow of contrast was noted at each level. Radiographs were obtained for documentation purposes.   After attaining the desired flow of contrast documented above, a 0.5 to 1.0 ml test dose of 0.25% Marcaine was injected into each respective transforaminal space.  The patient was observed for 90 seconds post injection.  After no sensory deficits were reported, and normal lower extremity motor function was noted,   the above injectate was administered so that equal amounts of the injectate were placed at each foramen (level) into the transforaminal epidural space.   Additional Comments:  The patient tolerated the procedure well No complications occurred Dressing: Band-Aid    Post-procedure details: Patient was observed during the procedure. Post-procedure instructions were reviewed.  Patient left the clinic in stable condition.   Clinical History: IMPRESSION: 1. Effacement of the fat surrounding the right L4 nerve root in the foramen at L4-5. This may contribute to the patient's right lower extremity symptoms. The cause for this is not entirely certain, although a right foraminal disc extrusion is suspected based on axial T1 weighted images. 2. No other significant spinal stenosis or nerve root encroachment. Mild spondylosis at the other levels as described. 3. Chronic Schmorl's nodes without acute osseous findings. 4. Scattered small marrow lesions, some of which are indeterminate. These are favored to be benign, potentially atypical hemangiomas. Follow-up MRI could be performed in 4-6 months to document stability.   Electronically Signed   By: Richardean Sale M.D.   On: 07/23/2016 13:03  She reports that she has never smoked. She has never used smokeless tobacco. No results for input(s): HGBA1C, LABURIC in the last 8760 hours.  Objective:  VS:  HT:    WT:   BMI:     BP:(!) 202/101  HR:89bpm   TEMP: ( )  RESP:96 % Physical Exam  Musculoskeletal:  Patient ambulates very slowly with a walker. She has decent strength distally. She does have absent pulses.    Ortho Exam Imaging: Xr C-arm No Report  Result Date: 08/07/2016 Please see Notes or Procedures tab for imaging impression.   Past Medical/Family/Surgical/Social History: Medications & Allergies reviewed per EMR Patient Active Problem List   Diagnosis Date Noted  . Ankle fracture 10/13/2015  . Closed left ankle fracture 10/12/2015  . GERD (gastroesophageal reflux disease) 10/12/2015  . Anxiety 10/12/2015  . Fall   . Hip fracture ([place]) 07/28/2014  . Protein-calorie malnutrition, severe ([place]) 07/28/2014  . Dizziness 04/24/2012  . Restless leg 03/15/2011  . Atrial flutter ([place]) 08/17/2010  . Essential hypertension 08/16/2010  . Coronary atherosclerosis 08/16/2010  . PVD 08/16/2010   Past Medical History:  Diagnosis Date  . Ankle fracture, left 09/2015  . Arthritis   . Atrial flutter ([place])   . Cancer ([place])    OVARIAN    1967  . Coronary artery disease   . Dysrhythmia    ATRIAL FLUTTER  . GERD (gastroesophageal reflux disease)   . Hypertension   . Peripheral arterial disease ([place])   . PVD (peripheral vascular disease) ([place])    Family History  Problem Relation Age of  Onset  . COPD Father   . Hypertension Father    Past Surgical History:  Procedure Laterality Date  . ABDOMINAL HYSTERECTOMY    . ATRIAL FLUTTER ABLATION N/A 05/22/2013   Procedure: ATRIAL FLUTTER ABLATION;  Surgeon: Evans Lance, MD;  Location: [facility] LAB;  Service: Cardiovascular;  Laterality: N/A;  . BPG    . CAROTID STENT  11/30/09   LAD S/P Right CA DES  . FRACTURE SURGERY Left Feb. 2, 2016   Left Hip Fx   . GLIAYTE CATHERTER INSERTION  06/27/10  . INTRAMEDULLARY (IM) NAIL INTERTROCHANTERIC Left 07/28/2014   Procedure: Left Affixus Nail;  Surgeon: Marybelle Killings, MD;  Location: [place] [facility];  Service: Orthopedics;  Laterality: Left;  . PR VEIN  BYPASS GRAFT,AORTO-FEM-POP     Right    Social History   Occupational History  . Not on file.   Social History Main Topics  . Smoking status: Never Smoker  . Smokeless tobacco: Never Used  . Alcohol use No  . Drug use: No  . Sexual activity: Not on file

## 2016-08-08 NOTE — Telephone Encounter (Signed)
Scheduled

## 2016-08-08 NOTE — Procedures (Signed)
Lumbosacral Transforaminal Epidural Steroid Injection - Infraneural Approach with Fluoroscopic Guidance  Patient: Michelle Gaines      Date of Birth: [birth year] MRN: [id number] PCP: Criselda Peaches, MD      Visit Date: 08/07/2016   Universal Protocol:    Date/Time: 02/14/185:54 AM  Consent Given By: the patient  Position: PRONE   Additional Comments: Vital signs were monitored before and after the procedure. Patient was prepped and draped in the usual sterile fashion. The correct patient, procedure, and site was verified.   Injection Procedure Details:  Procedure Site One Meds Administered:  Meds ordered this encounter  Medications  . lidocaine (PF) (XYLOCAINE) 1 % injection 0.3 mL  . methylPREDNISolone acetate (DEPO-MEDROL) injection 80 mg      Laterality: Right  Location/Site:  L4-L5  Needle size: 22 G  Needle type: Spinal  Needle Placement: Transforaminal  Findings:  -Contrast Used: 2 mL iohexol 180 mg iodine/mL   -Comments: Excellent flow of contrast along the nerve and into the epidural space. Initial placement was difficult due to oblique views being fairly obscured with vascular grafting and pretty significant osteopenia and the patient is very slight. We initially needle down to actually closer to the L3 foramen. We did repositioning get a different approach ankle and did complete the injection successfully on the right L4 transforaminal approach with good epidural flow. The patient had concordant pain symptoms during the injection.  Procedure Details: After squaring off the end-plates of the desired vertebral level to get a true AP view, the C-arm was obliqued to the painful side so that the superior articulating process is positioned about 1/3 the length of the inferior endplate.  The needle was aimed toward the junction of the superior articular process and the transverse process of the inferior vertebrae. The needle's initial entry is in the lower third of  the foramen through Kambin's triangle. The soft tissues overlying this target were infiltrated with 2-3 ml. of 1% Lidocaine without Epinephrine.  The spinal needle was then inserted and advanced toward the target using a "trajectory" view along the fluoroscope beam.  Under AP and lateral visualization, the needle was advanced so it did not puncture dura and did not traverse medially beyond the 6 o'clock position of the pedicle. Bi-planar projections were used to confirm position. Aspiration was confirmed to be negative for CSF and/or blood. A 1-2 ml. volume of Isovue-250 was injected and flow of contrast was noted at each level. Radiographs were obtained for documentation purposes.   After attaining the desired flow of contrast documented above, a 0.5 to 1.0 ml test dose of 0.25% Marcaine was injected into each respective transforaminal space.  The patient was observed for 90 seconds post injection.  After no sensory deficits were reported, and normal lower extremity motor function was noted,   the above injectate was administered so that equal amounts of the injectate were placed at each foramen (level) into the transforaminal epidural space.   Additional Comments:  The patient tolerated the procedure well No complications occurred Dressing: Band-Aid    Post-procedure details: Patient was observed during the procedure. Post-procedure instructions were reviewed.  Patient left the clinic in stable condition.

## 2016-08-13 DIAGNOSIS — H52203 Unspecified astigmatism, bilateral: Secondary | ICD-10-CM | POA: Diagnosis not present

## 2016-08-13 DIAGNOSIS — H524 Presbyopia: Secondary | ICD-10-CM | POA: Diagnosis not present

## 2016-08-13 DIAGNOSIS — H5213 Myopia, bilateral: Secondary | ICD-10-CM | POA: Diagnosis not present

## 2016-08-13 DIAGNOSIS — Z961 Presence of intraocular lens: Secondary | ICD-10-CM | POA: Diagnosis not present

## 2016-08-15 ENCOUNTER — Ambulatory Visit (INDEPENDENT_AMBULATORY_CARE_PROVIDER_SITE_OTHER): Payer: Medicare Other | Admitting: Orthopaedic Surgery

## 2016-08-15 DIAGNOSIS — G8929 Other chronic pain: Secondary | ICD-10-CM | POA: Diagnosis not present

## 2016-08-15 DIAGNOSIS — M545 Low back pain: Secondary | ICD-10-CM | POA: Diagnosis not present

## 2016-08-15 NOTE — Progress Notes (Signed)
Office Visit Note   Patient: Michelle Gaines           Date of Birth: 10-12-1928           MRN: [id number] Visit Date: 08/15/2016              Requested by: Levin Erp, MD [address], [address] [place],  [address] PCP: Criselda Peaches, MD   Assessment & Plan: Visit Diagnoses:  1. Chronic right-sided low back pain, with sciatica presence unspecified     Plan: Patient has a L4-5 disc on the right with some effacement of the nerve root. We'll check her back again in one month. She also has some atypical hemangiomas and radiologist considered possible follow-up MRI i she will return in one month to check her progress. She will elevate her foot for the swelling. n 6 months.  Follow-Up Instructions: Return in about 1 month (around 09/12/2016).   Orders:  No orders of the defined types were placed in this encounter.  No orders of the defined types were placed in this encounter.     Procedures: No procedures performed   Clinical Data: No additional findings.   Subjective: Chief Complaint  Patient presents with  . Lower Back - Follow-up, Pain    Patient is here for an Lumbar MRI review.  Patient had a lumbar epidural steroid injection on 08/07/16 with Dr. Ernestina Patches. Patient states pain relief for only 2 days after injection and pain returned.  Patient complains of continued right side groin and knee pain.  Patient complains of right ankle swelling after walking. Patient uses walker and takes asprin for pain.    Review of Systems 14 point review of systems is updated and is unchanged. Of notes previous ankle fracture previous the arterial disease with bypasses. She is ambulatory with a walker.   Objective: Vital Signs: There were no vitals taken for this visit.  Physical Exam  Constitutional: She is oriented to person, place, and time. She appears well-developed.  HENT:  Head: Normocephalic.  Right Ear: External ear normal.  Left Ear: External ear normal.    Eyes: Pupils are equal, round, and reactive to light.  Neck: No tracheal deviation present. No thyromegaly present.  Cardiovascular: Normal rate.   Pulmonary/Chest: Effort normal.  Abdominal: Soft.  Musculoskeletal:  Patient has pitting edema right lower extremity more prominent than previous visits. Edema goes down with elevation. She been up walking doing some cleaning cooking since she had epidural when she is feeling better. After few days she started have some recurrence of symptoms. She denies bowel or bladder symptoms no headache problems no fever or chills.  Neurological: She is alert and oriented to person, place, and time.  Skin: Skin is warm and dry.  Psychiatric: She has a normal mood and affect. Her behavior is normal.    Ortho Exam  Specialty Comments:  No specialty comments available.  Imaging: No results found.   PMFS History: Patient Active Problem List   Diagnosis Date Noted  . Ankle fracture 10/13/2015  . Closed left ankle fracture 10/12/2015  . GERD (gastroesophageal reflux disease) 10/12/2015  . Anxiety 10/12/2015  . Fall   . Hip fracture ([place]) 07/28/2014  . Protein-calorie malnutrition, severe ([place]) 07/28/2014  . Dizziness 04/24/2012  . Restless leg 03/15/2011  . Atrial flutter ([place]) 08/17/2010  . Essential hypertension 08/16/2010  . Coronary atherosclerosis 08/16/2010  . PVD 08/16/2010   Past Medical History:  Diagnosis Date  . Ankle fracture,  left 09/2015  . Arthritis   . Atrial flutter ([place])   . Cancer ([place])    OVARIAN    1967  . Coronary artery disease   . Dysrhythmia    ATRIAL FLUTTER  . GERD (gastroesophageal reflux disease)   . Hypertension   . Peripheral arterial disease ([place])   . PVD (peripheral vascular disease) ([facility])     Family History  Problem Relation Age of Onset  . COPD Father   . Hypertension Father     Past Surgical History:  Procedure Laterality Date  . ABDOMINAL HYSTERECTOMY    . ATRIAL FLUTTER ABLATION N/A  05/22/2013   Procedure: ATRIAL FLUTTER ABLATION;  Surgeon: Evans Lance, MD;  Location: [facility] CATH LAB;  Service: Cardiovascular;  Laterality: N/A;  . BPG    . CAROTID STENT  11/30/09   LAD S/P Right CA DES  . FRACTURE SURGERY Left Feb. 2, 2016   Left Hip Fx   . GLIAYTE CATHERTER INSERTION  06/27/10  . INTRAMEDULLARY (IM) NAIL INTERTROCHANTERIC Left 07/28/2014   Procedure: Left Affixus Nail;  Surgeon: Marybelle Killings, MD;  Location: [facility];  Service: Orthopedics;  Laterality: Left;  . PR VEIN BYPASS GRAFT,AORTO-FEM-POP     Right    Social History   Occupational History  . Not on file.   Social History Main Topics  . Smoking status: Never Smoker  . Smokeless tobacco: Never Used  . Alcohol use No  . Drug use: No  . Sexual activity: Not on file

## 2016-08-24 ENCOUNTER — Ambulatory Visit (INDEPENDENT_AMBULATORY_CARE_PROVIDER_SITE_OTHER): Payer: Medicare Other | Admitting: Orthopaedic Surgery

## 2016-08-27 ENCOUNTER — Telehealth (INDEPENDENT_AMBULATORY_CARE_PROVIDER_SITE_OTHER): Payer: Self-pay | Admitting: Orthopaedic Surgery

## 2016-08-27 ENCOUNTER — Telehealth (INDEPENDENT_AMBULATORY_CARE_PROVIDER_SITE_OTHER): Payer: Self-pay | Admitting: Physical Medicine and Rehabilitation

## 2016-08-27 NOTE — Telephone Encounter (Signed)
Follow up with Dr. Lorin Mercy so he can determine if spine is source of the leg pain or he can send her to vascular.

## 2016-08-27 NOTE — Telephone Encounter (Signed)
There was not a message attached to this. Do you know what she needed? Thanks.

## 2016-08-27 NOTE — Telephone Encounter (Signed)
Dr. Lorin Mercy is out of town this week at a conference. Would you please ask Dr. Ernestina Patches if he thinks this is something that can wait until he returns or if I need to advise the patient to do something else? Thanks.

## 2016-08-28 NOTE — Telephone Encounter (Signed)
From a spine standpoint she can wait, if she is wanting to see her vascular doctors she should be able to make an appointment without referral as she has seen them. I do not have anything specifically I could help her with since the injection did not help much. I still feel like the spine may be the source of the pain.

## 2016-08-30 NOTE — Telephone Encounter (Signed)
I have tried to reach patient to discuss. Will try again.

## 2016-09-03 NOTE — Telephone Encounter (Signed)
Please see all messages. Would you like for me to try and make patient appt?

## 2016-09-04 NOTE — Telephone Encounter (Signed)
I called no answer

## 2016-09-11 ENCOUNTER — Encounter (INDEPENDENT_AMBULATORY_CARE_PROVIDER_SITE_OTHER): Payer: Self-pay

## 2016-09-11 ENCOUNTER — Encounter (INDEPENDENT_AMBULATORY_CARE_PROVIDER_SITE_OTHER): Payer: Self-pay | Admitting: Orthopaedic Surgery

## 2016-09-11 ENCOUNTER — Ambulatory Visit (INDEPENDENT_AMBULATORY_CARE_PROVIDER_SITE_OTHER): Payer: Medicare Other | Admitting: Orthopaedic Surgery

## 2016-09-11 VITALS — Ht 62.0 in | Wt 84.0 lb

## 2016-09-11 DIAGNOSIS — M4726 Other spondylosis with radiculopathy, lumbar region: Secondary | ICD-10-CM | POA: Diagnosis not present

## 2016-09-11 NOTE — Progress Notes (Signed)
Office Visit Note   Patient: Michelle Gaines           Date of Birth: 01/05/1929           MRN: [id number] Visit Date: 09/11/2016              Requested by: Levin Erp, MD [address], [place] [place], [place] [address] PCP: Criselda Peaches, MD   Assessment & Plan: Visit Diagnoses:  1. Other spondylosis with radiculopathy, lumbar region   2.     Peripheral arterial disease previous bypass right leg.  Plan: Patient will continue to ambulate with a walker to prevent falling. She has problems when she uses a quad cane and states he gets under way sometimes. She asked and I provided a copy of her arterial Doppler test. She has a follow-up for vascular clinic in April or May for routine follow-up. Lumbar MRI result was reviewed with the patient with possible extraforaminal disc on the right at L4-5. Patient got 2 days relief with the epidural injection on the right at L4-5. If there is disc causing some radicular symptoms versus pain from poor arterial supply I discussed with her that the disc may resolve with time. She'll continue to ambulate for cardiovascular fitness. Office follow-up with me on a when necessary basis.  Follow-Up Instructions: Return if symptoms worsen or fail to improve.   Orders:  No orders of the defined types were placed in this encounter.  No orders of the defined types were placed in this encounter.  Family history: Positive for mother with peripheral arterial disease requiring surgery by Dr. Kellie Simmering many years ago.   Procedures: No procedures performed   Clinical Data: No additional findings.   Subjective: Chief Complaint  Patient presents with  . Right Leg - Pain    Patient returns for follow up right leg pain. She continues to have pain in her right groin, right knee, lateral right lower leg and right ankle. She states that the injection she received from Dr. Ernestina Patches only lasted two days and was very painful. She does not feel like going  through it is worth it.  She ambulates with a walker. She takes a white willowbark aspirin capsule for pain.     Review of Systems  Constitutional: Negative for chills and diaphoresis.  HENT: Negative for ear discharge, ear pain and nosebleeds.   Eyes: Negative for discharge and visual disturbance.  Respiratory: Negative for cough, choking and shortness of breath.   Cardiovascular: Negative for chest pain and palpitations.       Positive for PAD. ABI shows 0.4 on the right and 0.6 on the left. She has occlusion at the end of the graft. Right superficial femoral artery is occluded.  Gastrointestinal: Negative for abdominal distention and abdominal pain.  Endocrine: Negative for cold intolerance and heat intolerance.  Genitourinary: Negative for flank pain and hematuria.  Skin: Negative for rash and wound.  Neurological: Negative for seizures and speech difficulty.  Hematological: Negative for adenopathy. Does not bruise/bleed easily.  Psychiatric/Behavioral: Negative for agitation and suicidal ideas.   positive for history of atrial flutter, history of falls, left closed ankle fracture and also hip fracture. Lumbar disc degeneration   Objective: Vital Signs: Ht 5\' 2"  (1.575 m)   Wt 84 lb (38.1 kg)   BMI 15.36 kg/m   Physical Exam  Constitutional: She is oriented to person, place, and time. She appears well-developed.  HENT:  Head: Normocephalic.  Right Ear: External  ear normal.  Left Ear: External ear normal.  Eyes: Pupils are equal, round, and reactive to light.  Neck: No tracheal deviation present. No thyromegaly present.  Cardiovascular: Normal rate.   Pulmonary/Chest: Effort normal.  Abdominal: Soft.  Musculoskeletal:  Patient has some venous discoloration left pretibial region trace edema. Anterior tib EHL is strong she can extend her knees with fair quad strength. No palpable pedal pulses. Capillary refill is slow but present. No ischemic ulcers on her feet or ankles or  legs. Straight leg raising 90. She is tender over the lateral calf tender over the lateral femoral condyle on the right femur also some tenderness of the trochanter and some of the sciatic notch on the right more than left. No lumbar tenderness.  Neurological: She is alert and oriented to person, place, and time.  Skin: Skin is warm and dry.  Psychiatric: She has a normal mood and affect. Her behavior is normal.    Ortho Exam  Specialty Comments:  No specialty comments available.  Imaging: No results found.   PMFS History: Patient Active Problem List   Diagnosis Date Noted  . Ankle fracture 10/13/2015  . Closed left ankle fracture 10/12/2015  . GERD (gastroesophageal reflux disease) 10/12/2015  . Anxiety 10/12/2015  . Fall   . Hip fracture ([place]) 07/28/2014  . Protein-calorie malnutrition, severe ([place]) 07/28/2014  . Dizziness 04/24/2012  . Restless leg 03/15/2011  . Atrial flutter ([place]) 08/17/2010  . Essential hypertension 08/16/2010  . Coronary atherosclerosis 08/16/2010  . PVD 08/16/2010   Past Medical History:  Diagnosis Date  . Ankle fracture, left 09/2015  . Arthritis   . Atrial flutter ([place])   . Cancer ([place])    OVARIAN    1967  . Coronary artery disease   . Dysrhythmia    ATRIAL FLUTTER  . GERD (gastroesophageal reflux disease)   . Hypertension   . Peripheral arterial disease ([place])   . PVD (peripheral vascular disease) ([facility])     Family History  Problem Relation Age of Onset  . COPD Father   . Hypertension Father     Past Surgical History:  Procedure Laterality Date  . ABDOMINAL HYSTERECTOMY    . ATRIAL FLUTTER ABLATION N/A 05/22/2013   Procedure: ATRIAL FLUTTER ABLATION;  Surgeon: Evans Lance, MD;  Location: [facility] CATH LAB;  Service: Cardiovascular;  Laterality: N/A;  . BPG    . CAROTID STENT  11/30/09   LAD S/P Right CA DES  . FRACTURE SURGERY Left Feb. 2, 2016   Left Hip Fx   . GLIAYTE CATHERTER INSERTION  06/27/10  . INTRAMEDULLARY (IM) NAIL  INTERTROCHANTERIC Left 07/28/2014   Procedure: Left Affixus Nail;  Surgeon: Marybelle Killings, MD;  Location: [facility];  Service: Orthopedics;  Laterality: Left;  . PR VEIN BYPASS GRAFT,AORTO-FEM-POP     Right    Social History   Occupational History  . Not on file.   Social History Main Topics  . Smoking status: Never Smoker  . Smokeless tobacco: Never Used  . Alcohol use No  . Drug use: No  . Sexual activity: Not on file

## 2016-09-11 NOTE — Telephone Encounter (Signed)
Has appt today

## 2016-09-19 ENCOUNTER — Encounter: Payer: Self-pay | Admitting: Internal Medicine

## 2016-09-26 ENCOUNTER — Encounter: Payer: Self-pay | Admitting: Family

## 2016-10-01 ENCOUNTER — Encounter: Payer: Self-pay | Admitting: Internal Medicine

## 2016-10-01 ENCOUNTER — Ambulatory Visit (INDEPENDENT_AMBULATORY_CARE_PROVIDER_SITE_OTHER): Payer: Medicare Other | Admitting: Internal Medicine

## 2016-10-01 VITALS — BP 162/84 | HR 80 | Ht 62.0 in | Wt 80.6 lb

## 2016-10-01 DIAGNOSIS — I10 Essential (primary) hypertension: Secondary | ICD-10-CM | POA: Diagnosis not present

## 2016-10-01 DIAGNOSIS — I2510 Atherosclerotic heart disease of native coronary artery without angina pectoris: Secondary | ICD-10-CM

## 2016-10-01 NOTE — Patient Instructions (Signed)
Medication Instructions:  Your physician recommends that you continue on your current medications as directed. Please refer to the Current Medication list given to you today.   Labwork: None Ordered   Testing/Procedures: None Ordered   Follow-Up: Your physician wants you to follow-up in: 1 year with Dr. Taylor.  You will receive a reminder letter in the mail two months in advance. If you don't receive a letter, please call our office to schedule the follow-up appointment.   If you need a refill on your cardiac medications before your next appointment, please call your pharmacy.   Thank you for choosing [facility]! Wilkins Elpers, RN [phone]    

## 2016-10-01 NOTE — Progress Notes (Signed)
HPI Michelle Gaines returns today for followup. She is a very pleasant 83 yo woman with a h/o atrial flutter s/p catheter ablation.  She notes that her blood pressure increased over the past few months. She denies medical non-compliance. However, she states that she is trying to get off of all of her meds and has a list of the "worst drugs" and states she is not taking her anxiety meds or her beta blocker. Allergies  Allergen Reactions  . Fexofenadine Shortness Of Breath  . Cephalosporins Rash  . Diltiazem Other (See Comments)    Dizziness   . Penicillins Nausea And Vomiting, Swelling and Rash  . Amlodipine Other (See Comments)  . Anaprox [Naproxen Sodium] Other (See Comments)  . Benzonatate Other (See Comments)  . Cephalexin Other (See Comments)  . Clindamycin/Lincomycin Other (See Comments)  . Clorazepate Other (See Comments)  . Codeine Other (See Comments)  . Doxycycline Other (See Comments)  . Hydralazine Other (See Comments)  . Labetalol Other (See Comments)  . Lisinopril Other (See Comments)  . Phenylephrine Other (See Comments)  . Phenytoin Other (See Comments)  . Zolpidem Other (See Comments)     Current Outpatient Prescriptions  Medication Sig Dispense Refill  . aspirin (BAYER ASPIRIN) 325 MG tablet Take 1 tablet (325 mg total) by mouth daily. 28 tablet 0  . BETA CAROTENE PO Take 1 tablet by mouth daily.    . carvedilol (COREG) 6.25 MG tablet Take 1 tablet (6.25 mg total) by mouth 2 (two) times daily with a meal. Okay to take one additional tablet daily as needed SBP > 150. 180 tablet 3  . cyanocobalamin 100 MCG tablet Take 100 mcg by mouth daily.    . Flaxseed, Linseed, (FLAX SEED OIL PO) Take 1 tablet by mouth daily.    . Homeopathic Products (SLEEP MEDICINE) TABS Take 1 tablet by mouth at bedtime.    Marland Kitchen LORazepam (ATIVAN) 0.5 MG tablet Take 0.5 mg by mouth at bedtime.     . Lutein 20 MG TABS Take 20 mg by mouth daily.    . magnesium gluconate (MAGONATE) 500 MG  tablet Take 500 mg by mouth 2 (two) times daily.      . multivitamin (THERAGRAN) per tablet Take 1 tablet by mouth daily.      . nitroGLYCERIN (NITROSTAT) 0.4 MG SL tablet Place 1 tablet (0.4 mg total) under the tongue every 5 (five) minutes as needed for chest pain. 25 tablet 12  . Potassium 99 MG TABS Take 1 tablet by mouth daily.      Marland Kitchen Ubiquinol 50 MG CAPS Take 50 mg by mouth 2 (two) times daily.    Marland Kitchen VITAMIN D, CHOLECALCIFEROL, PO Take 1,000 mg by mouth daily.     . vitamin E 400 UNIT capsule Take 400 Units by mouth daily.     No current facility-administered medications for this visit.      Past Medical History:  Diagnosis Date  . Ankle fracture, left 09/2015  . Arthritis   . Atrial flutter ([place])   . Cancer ([place])    OVARIAN    1967  . Coronary artery disease   . Dysrhythmia    ATRIAL FLUTTER  . GERD (gastroesophageal reflux disease)   . Hypertension   . Peripheral arterial disease ([place])   . PVD (peripheral vascular disease) ([place])     ROS:   All systems reviewed and negative except as noted in the HPI.   Past Surgical History:  Procedure Laterality Date  . ABDOMINAL HYSTERECTOMY    . ATRIAL FLUTTER ABLATION N/A 05/22/2013   Procedure: ATRIAL FLUTTER ABLATION;  Surgeon: Evans Lance, MD;  Location: [facility] CATH LAB;  Service: Cardiovascular;  Laterality: N/A;  . BPG    . CAROTID STENT  11/30/09   LAD S/P Right CA DES  . FRACTURE SURGERY Left Feb. 2, 2016   Left Hip Fx   . GLIAYTE CATHERTER INSERTION  06/27/10  . INTRAMEDULLARY (IM) NAIL INTERTROCHANTERIC Left 07/28/2014   Procedure: Left Affixus Nail;  Surgeon: Marybelle Killings, MD;  Location: [facility];  Service: Orthopedics;  Laterality: Left;  . PR VEIN BYPASS GRAFT,AORTO-FEM-POP     Right      Family History  Problem Relation Age of Onset  . COPD Father   . Hypertension Father      Social History   Social History  . Marital status: Married    Spouse name: N/A  . Number of children: N/A  . Years of education: N/A     Occupational History  . Not on file.   Social History Main Topics  . Smoking status: Never Smoker  . Smokeless tobacco: Never Used  . Alcohol use No  . Drug use: No  . Sexual activity: Not on file   Other Topics Concern  . Not on file   Social History Narrative  . No narrative on file     BP (!) 162/84   Pulse 80   Ht 5\' 2"  (1.575 m)   Wt 80 lb 9.6 oz (36.6 kg)   BMI 14.74 kg/m   Physical Exam:  frail appearing 83 yo woman, NAD HEENT: Unremarkable Neck:  6 cm JVD, no thyromegall Back:  No CVA tenderness Lungs:  Clear with no wheezes HEART:  Regular rate rhythm, no murmurs, no rubs, no clicks Abd:  soft, positive bowel sounds, no organomegally, no rebound, no guarding Ext:  2 plus pulses, no edema, no cyanosis, no clubbing Skin:  No rashes no nodules Neuro:  CN II through XII intact, motor grossly intact  ECG - nsr with pac's  Assess/Plan: 1. Atrial flutter - she is doing well, s/p ablation 2. HTN - her blood pressure is reasonably well controlled. She has stopped taking her coreg. 3. Malnutrition - she denies a reduction in calorie count. She is encouraged to increase her oral intake.  Mikle Bosworth.D.

## 2016-10-02 DIAGNOSIS — I4892 Unspecified atrial flutter: Secondary | ICD-10-CM | POA: Diagnosis not present

## 2016-10-02 DIAGNOSIS — I2510 Atherosclerotic heart disease of native coronary artery without angina pectoris: Secondary | ICD-10-CM | POA: Diagnosis not present

## 2016-10-02 DIAGNOSIS — I10 Essential (primary) hypertension: Secondary | ICD-10-CM | POA: Diagnosis not present

## 2016-10-04 ENCOUNTER — Encounter: Payer: Self-pay | Admitting: Family

## 2016-10-04 ENCOUNTER — Ambulatory Visit (HOSPITAL_COMMUNITY)
Admission: RE | Admit: 2016-10-04 | Discharge: 2016-10-04 | Disposition: A | Discharge status: Home / Self Care | Payer: Medicare Other | Source: Ambulatory Visit | Attending: Family | Admitting: Family

## 2016-10-04 ENCOUNTER — Ambulatory Visit (INDEPENDENT_AMBULATORY_CARE_PROVIDER_SITE_OTHER)
Admission: RE | Admit: 2016-10-04 | Discharge: 2016-10-04 | Disposition: A | Discharge status: Home / Self Care | Payer: Medicare Other | Source: Ambulatory Visit | Attending: Family | Admitting: Family

## 2016-10-04 ENCOUNTER — Ambulatory Visit (INDEPENDENT_AMBULATORY_CARE_PROVIDER_SITE_OTHER): Payer: Medicare Other | Admitting: Family

## 2016-10-04 VITALS — BP 170/90 | HR 58 | Temp 97.8°F | Resp 14 | Ht 60.0 in | Wt 81.0 lb

## 2016-10-04 DIAGNOSIS — T82392A Other mechanical complication of femoral arterial graft (bypass), initial encounter: Secondary | ICD-10-CM | POA: Insufficient documentation

## 2016-10-04 DIAGNOSIS — Y832 Surgical operation with anastomosis, bypass or graft as the cause of abnormal reaction of the patient, or of later complication, without mention of misadventure at the time of the procedure: Secondary | ICD-10-CM | POA: Insufficient documentation

## 2016-10-04 DIAGNOSIS — R0989 Other specified symptoms and signs involving the circulatory and respiratory systems: Secondary | ICD-10-CM | POA: Diagnosis not present

## 2016-10-04 DIAGNOSIS — R938 Abnormal findings on diagnostic imaging of other specified body structures: Secondary | ICD-10-CM | POA: Diagnosis not present

## 2016-10-04 DIAGNOSIS — I779 Disorder of arteries and arterioles, unspecified: Secondary | ICD-10-CM | POA: Diagnosis not present

## 2016-10-04 DIAGNOSIS — Z95828 Presence of other vascular implants and grafts: Secondary | ICD-10-CM

## 2016-10-04 DIAGNOSIS — I10 Essential (primary) hypertension: Secondary | ICD-10-CM | POA: Diagnosis not present

## 2016-10-04 NOTE — Progress Notes (Signed)
[facility] OF [place]   CC: Follow up peripheral artery occlusive disease  History of Present Illness Michelle Gaines is a 83 y.o. female patient of Dr. Kellie Simmering who is s/p right external iliac to profunda femoris bypass performed in 2009 for ischemia of the right leg. She has chronic occlusion of the right SFA and bypass graft with good flow into the profunda on the right.   She continues to have no specific symptoms of rest pain or nonhealing ulcers.   I saw her on 07-06-16 at the request of Dr. Lorin Mercy to be evaluated at [facility], re 1 week hx of right knee, anterior thigh, and hip pain, aggravated by laying in bed.  She had left thigh pain with walking since her left femur fracture and ORIF in February 2016, but that had resolved.  At that visit Dr. Donzetta Matters spoke with and examined pt, reviewed her previous and that day's vascular studies results.  Her arterial perfusion in her right iliac to right leg had not changed in the prior 3 years, bilateral toe brachial indices had imrpoved.  Suggested to consider neurological or muscoskeletal etiology, deferred to Dr. Lorin Mercy and Dr. Nyoka Cowden. She did not indicate claudication type pain with walking at that time nor today.  She is performing daily seated leg exercises as instructed by physical therapy which she feels helps her legs.  Pt states she has always been thin, her appetite is good. Pt denies any history of stroke or TIA.   Pt Diabetic: Yes Pt smoker: non-smoker  Pt meds include: Statin :No, states she does not want to take Betablocker: Yes ASA: No Other anticoagulants/antiplatelets: no     Past Medical History:  Diagnosis Date  . Ankle fracture, left 09/2015  . Arthritis   . Atrial flutter ([place])   . Cancer ([place])    OVARIAN    1967  . Coronary artery disease   . Dysrhythmia    ATRIAL FLUTTER  . GERD (gastroesophageal reflux disease)   . Hypertension   . Peripheral arterial disease ([place])   . PVD (peripheral  vascular disease) ([place])     Social History Social History  Substance Use Topics  . Smoking status: Never Smoker  . Smokeless tobacco: Never Used  . Alcohol use No    Family History Family History  Problem Relation Age of Onset  . COPD Father   . Hypertension Father     Past Surgical History:  Procedure Laterality Date  . ABDOMINAL HYSTERECTOMY    . ATRIAL FLUTTER ABLATION N/A 05/22/2013   Procedure: ATRIAL FLUTTER ABLATION;  Surgeon: Evans Lance, MD;  Location: [facility] C[facility] LAB;  Service: Cardiovascular;  Laterality: N/A;  . BPG    . CAROTID STENT  11/30/09   LAD S/P Right CA DES  . FRACTURE SURGERY Left Feb. 2, 2016   Left Hip Fx   . GLIAYTE CATHERTER INSERTION  06/27/10  . INTRAMEDULLARY (IM) NAIL INTERTROCHANTERIC Left 07/28/2014   Procedure: Left Affixus Nail;  Surgeon: Marybelle Killings, MD;  Location: [place];  Service: Orthopedics;  Laterality: Left;  . PR VEIN BYPASS GRAFT,AORTO-FEM-POP     Right     Allergies  Allergen Reactions  . Fexofenadine Shortness Of Breath  . Cephalosporins Rash  . Diltiazem Other (See Comments)    Dizziness   . Penicillins Nausea And Vomiting, Swelling and Rash  . Amlodipine Other (See Comments)  . Anaprox [Naproxen Sodium] Other (See Comments)  . Benzonatate Other (See Comments)  .  Cephalexin Other (See Comments)  . Clindamycin/Lincomycin Other (See Comments)  . Clorazepate Other (See Comments)  . Codeine Other (See Comments)  . Doxycycline Other (See Comments)  . Hydralazine Other (See Comments)  . Labetalol Other (See Comments)  . Lisinopril Other (See Comments)  . Phenylephrine Other (See Comments)  . Phenytoin Other (See Comments)  . Zolpidem Other (See Comments)    Current Outpatient Prescriptions  Medication Sig Dispense Refill  . BETA CAROTENE PO Take 1 tablet by mouth daily.    . carvedilol (COREG) 6.25 MG tablet Take 1 tablet (6.25 mg total) by mouth 2 (two) times daily with a meal. Okay to take one additional tablet daily as  needed SBP > 150. 180 tablet 3  . cyanocobalamin 100 MCG tablet Take 100 mcg by mouth daily.    . Flaxseed, Linseed, (FLAX SEED OIL PO) Take 1 tablet by mouth daily.    . Homeopathic Products (SLEEP MEDICINE) TABS Take 1 tablet by mouth at bedtime.    Marland Kitchen LORazepam (ATIVAN) 1 MG tablet     . Lutein 20 MG TABS Take 20 mg by mouth daily.    . magnesium gluconate (MAGONATE) 500 MG tablet Take 500 mg by mouth 2 (two) times daily.      . multivitamin (THERAGRAN) per tablet Take 1 tablet by mouth daily.      . nitroGLYCERIN (NITROSTAT) 0.4 MG SL tablet Place 1 tablet (0.4 mg total) under the tongue every 5 (five) minutes as needed for chest pain. 25 tablet 12  . Potassium 99 MG TABS Take 1 tablet by mouth daily.      Marland Kitchen Ubiquinol 50 MG CAPS Take 50 mg by mouth 2 (two) times daily.    Marland Kitchen VITAMIN D, CHOLECALCIFEROL, PO Take 1,000 mg by mouth daily.     . vitamin E 400 UNIT capsule Take 400 Units by mouth daily.     No current facility-administered medications for this visit.     ROS: See HPI for pertinent positives and negatives.   Physical Examination  Vitals:   10/04/16 1111  BP: (!) 170/90  Pulse: (!) 58  Resp: 14  Temp: 97.8 F (36.6 C)  TempSrc: Oral  SpO2: 99%  Weight: 81 lb (36.7 kg)  Height: 5' (1.524 m)   Body mass index is 15.82 kg/m.  General: A&O x 3, WDWN, thin elderly female. Gait: slow and deliberate, steady, walking with a walker Eyes: PERRLA. Pulmonary: Respirations are non labored, CTAB, without wheezes, rales or rhonchi. Cardiac: regular rhythm, nodetected murmur.    Carotid Bruits Right Left   Negative Negative  Aorta is mildly palpable (pt is quite thin). Radial pulses: 2+ bilaterally   VASCULAR EXAM: Extremitieswithoutischemic changes, withoutGangrene; withoutopen wounds.  LE Pulses Right Left  FEMORAL not palpable 2+ palpable  POPLITEAL not palpable not palpable  POSTERIOR TIBIAL not  palpable not palpable  DORSALIS PEDIS ANTERIOR TIBIAL not palpable not palpable   Abdomen: soft, NT, no palpable masses. Skin: no rashes, no ulcers. Musculoskeletal: no muscle wasting or atrophy. Neurologic: A&O X 3; Appropriate Affect; MOTOR FUNCTION: moving all extremities equally, motor strength 4/5 throughout. Speech is fluent/normal. CN 2-12 intact except has some hearing loss.     ASSESSMENT: Michelle Gaines is a 83 y.o. female who is s/pright external iliac to profunda femoris bypass performed in 2009 for ischemia of the right leg. She has chronic occlusion of the SFA and bypass graft with good flow into the profunda on the right. She indicates that  she does not have pain or weakness in her calves or thighs with walking. There are no signs of ischemia in her feet/legs.  She fractured her left femur in February 2016 from falling, had an ORIF.  She states she fractured her left ankle in February 2018, states Dr. Sharol Given treated her for this with a cast, but no record of this on file.  She has no signs of ischemia of her lower extremities. She indicates that she stays very active, so active that she does not have time during the day to perform seated leg exercises, but states she does perform them in the morning before she gets out of bed.   She received an ESI for lumbar radiculopathy recently, records indicate that this helped her for about 2 days, but declined any subsequent ESI as the procedure was painful.   It is difficult to ascertain which medications she is taking and which medications she is not taking. She stated several times that she wants to stop taking all of her medications. It is difficult to get a reliable history from her or her husband.     DATA Today's right LE arterial Duplex demonstrates known occlusion of the distal anastomosis and proximal femoral artery .  Patent portion of the bypass graft is feeding the profunda femoral  artery. Collaterals are visualized in the mid femoral artery and distal femoral artery providing flow distally.  No significant change compared to the last exam on 07-06-16.   ABI's (10/04/16): Right: 0.96, does not correspond to monophasic waveforms (was 0.67  with monophasic waveforms), TBI: [place] (was 0.49). Left: 0.73 (was 0.52), monophasic waveforms; TBI: [place] (was 0.55) ABI's are falsely elevated since they do not correspond to monophasic waveforms, both TBI's are non compressible.       PLAN:  Based on the patient's vascular studies and examination, pt will return to clinic in 6 months with ABI's, pt requested to see Dr. Donnetta Hutching afterward at pt request.   I advised her and her husband to call our office if she develops sores that have trouble healing on her feet or legs, or if she develops any concerns re the circulation in her feet/legs.   I discussed in depth with the patient the nature of atherosclerosis, and emphasized the importance of maximal medical management including strict control of blood pressure, blood glucose, and lipid levels, obtaining regular exercise, and continued cessation of smoking.  The patient is aware that without maximal medical management the underlying atherosclerotic disease process will progress, limiting the benefit of any interventions.  The patient was given information about PAD including signs, symptoms, treatment, what symptoms should prompt the patient to seek immediate medical care, and risk reduction measures to take.  Clemon Chambers, RN, MSN, FNP-C [facility] Phone: [phone]  Clinic MD: [facility]  10/04/16 11:33 AM

## 2016-10-04 NOTE — Patient Instructions (Signed)

## 2016-10-05 NOTE — Addendum Note (Signed)
Addended by: Lianne Cure A on: 10/05/2016 09:21 AM   Modules accepted: Orders

## 2017-04-16 ENCOUNTER — Encounter: Payer: Self-pay | Admitting: Vascular Surgery

## 2017-04-16 ENCOUNTER — Ambulatory Visit (HOSPITAL_COMMUNITY)
Admission: RE | Admit: 2017-04-16 | Discharge: 2017-04-16 | Disposition: A | Discharge status: Home / Self Care | Payer: Medicare Other | Source: Ambulatory Visit | Attending: Vascular Surgery | Admitting: Vascular Surgery

## 2017-04-16 ENCOUNTER — Ambulatory Visit (INDEPENDENT_AMBULATORY_CARE_PROVIDER_SITE_OTHER): Payer: Medicare Other | Admitting: Vascular Surgery

## 2017-04-16 VITALS — BP 182/85 | HR 72 | Temp 98.4°F | Resp 16 | Ht 60.0 in | Wt 83.5 lb

## 2017-04-16 DIAGNOSIS — Z95828 Presence of other vascular implants and grafts: Secondary | ICD-10-CM | POA: Diagnosis not present

## 2017-04-16 DIAGNOSIS — I779 Disorder of arteries and arterioles, unspecified: Secondary | ICD-10-CM | POA: Insufficient documentation

## 2017-04-16 NOTE — Progress Notes (Signed)
[facility]ist of [facility]  Patient name: Michelle Gaines MRN: [id number] DOB: 1929-03-24 Sex: female  REASON FOR VISIT: [place] of peripheral vascular occlusive disease  HPI: Michelle Gaines is a 83 y.o. female status post right external iliac to profunda bypass by Dr. Kellie Simmering in 2009.  She has been followed in our office since that time.  For some reason she requested that I see her rather than our nurse practitioner for follow-up.  Here today with her husband.  She is able to walk without difficulty.  Her main symptoms are in her left leg.  She has had multiple fractures of her ankle and also of her hip apparently has had a hip pinning.  Has no tissue loss and no rest pain  Past Medical History:  Diagnosis Date  . Ankle fracture, left 09/2015  . Arthritis   . Atrial flutter ([place])   . Cancer ([place])    OVARIAN    1967  . Coronary artery disease   . Dysrhythmia    ATRIAL FLUTTER  . GERD (gastroesophageal reflux disease)   . Hypertension   . Peripheral arterial disease ([place])   . PVD (peripheral vascular disease) ([facility])     Family History  Problem Relation Age of Onset  . COPD Father   . Hypertension Father     SOCIAL HISTORY: Social History  Substance Use Topics  . Smoking status: Never Smoker  . Smokeless tobacco: Never Used  . Alcohol use No    Allergies  Allergen Reactions  . Fexofenadine Shortness Of Breath  . Cephalosporins Rash  . Diltiazem Other (See Comments)    Dizziness   . Penicillins Nausea And Vomiting, Swelling and Rash  . Amlodipine Other (See Comments)  . Anaprox [Naproxen Sodium] Other (See Comments)  . Benzonatate Other (See Comments)  . Cephalexin Other (See Comments)  . Clindamycin/Lincomycin Other (See Comments)  . Clorazepate Other (See Comments)  . Codeine Other (See Comments)  . Doxycycline Other (See Comments)  . Hydralazine Other (See Comments)  . Labetalol Other (See Comments)  .  Lisinopril Other (See Comments)  . Phenylephrine Other (See Comments)  . Phenytoin Other (See Comments)  . Zolpidem Other (See Comments)    Current Outpatient Prescriptions  Medication Sig Dispense Refill  . BETA CAROTENE PO Take 1 tablet by mouth daily.    . carvedilol (COREG) 6.25 MG tablet Take 1 tablet (6.25 mg total) by mouth 2 (two) times daily with a meal. Okay to take one additional tablet daily as needed SBP > 150. 180 tablet 3  . cyanocobalamin 100 MCG tablet Take 100 mcg by mouth daily.    . Flaxseed, Linseed, (FLAX SEED OIL PO) Take 1 tablet by mouth daily.    . Homeopathic Products (SLEEP MEDICINE) TABS Take 1 tablet by mouth at bedtime.    Marland Kitchen LORazepam (ATIVAN) 1 MG tablet     . Lutein 20 MG TABS Take 20 mg by mouth daily.    . magnesium gluconate (MAGONATE) 500 MG tablet Take 500 mg by mouth 2 (two) times daily.      . multivitamin (THERAGRAN) per tablet Take 1 tablet by mouth daily.      . nitroGLYCERIN (NITROSTAT) 0.4 MG SL tablet Place 1 tablet (0.4 mg total) under the tongue every 5 (five) minutes as needed for chest pain. 25 tablet 12  . Potassium 99 MG TABS Take 1 tablet by mouth daily.      Marland Kitchen Ubiquinol 50 MG CAPS  Take 50 mg by mouth 2 (two) times daily.    Marland Kitchen VITAMIN D, CHOLECALCIFEROL, PO Take 1,000 mg by mouth daily.     . vitamin E 400 UNIT capsule Take 400 Units by mouth daily.     No current facility-administered medications for this visit.     REVIEW OF SYSTEMS:  [X]  denotes positive finding, [ ]  denotes negative finding Cardiac  Comments:  Chest pain or chest pressure:    Shortness of breath upon exertion:    Short of breath when lying flat:    Irregular heart rhythm: x       Vascular    Pain in calf, thigh, or hip brought on by ambulation:    Pain in feet at night that wakes you up from your sleep:     Blood clot in your veins: x   Leg swelling:           PHYSICAL EXAM: Vitals:   04/16/17 1132 04/16/17 1133  BP: (!) 184/83 (!) 182/85  Pulse:  72   Resp: 16   Temp: 98.4 F (36.9 C)   TempSrc: Oral   SpO2: 100%   Weight: 83 lb 8 oz (37.9 kg)   Height: 5' (1.524 m)     GENERAL: The patient is a well-nourished female, in no acute distress. The vital signs are documented above. CARDIOVASCULAR: 2+ radial and 2+ femoral pulses bilaterally.  Absent popliteal and distal pulses.  Pitting edema in both lower extremities.  No ulcerations on her feet. PULMONARY: There is good air exchange  MUSCULOSKELETAL: There are no major deformities or cyanosis. NEUROLOGIC: No focal weakness or paresthesias are detected. SKIN: There are no ulcers or rashes noted. PSYCHIATRIC: The patient has a normal affect.  DATA:  Noninvasive studies reveal a stable ankle arm indices at 0.  8 0 on the right and 0.66 on the left  MEDICAL ISSUES: Stable peripheral vascular occlusive disease.  No difficulty with her bypass from 2009.  We will continue her walking program.  We will see [place] in 1 year with repeat noninvasive studies    Rosetta Posner, MD [facility] Tel [phone] Pager [phone]

## 2017-04-18 IMAGING — CR DG HIP (WITH OR WITHOUT PELVIS) 2-3V*L*
3 series · 3 of 3 positions shown · non-contrast
Comparison: 07/27/2014

CLINICAL DATA: Fall.  Left hip pain.  Previous left hip fracture.

EXAM:
DG HIP (WITH OR WITHOUT PELVIS) 2-3V LEFT

[pelvis ap]
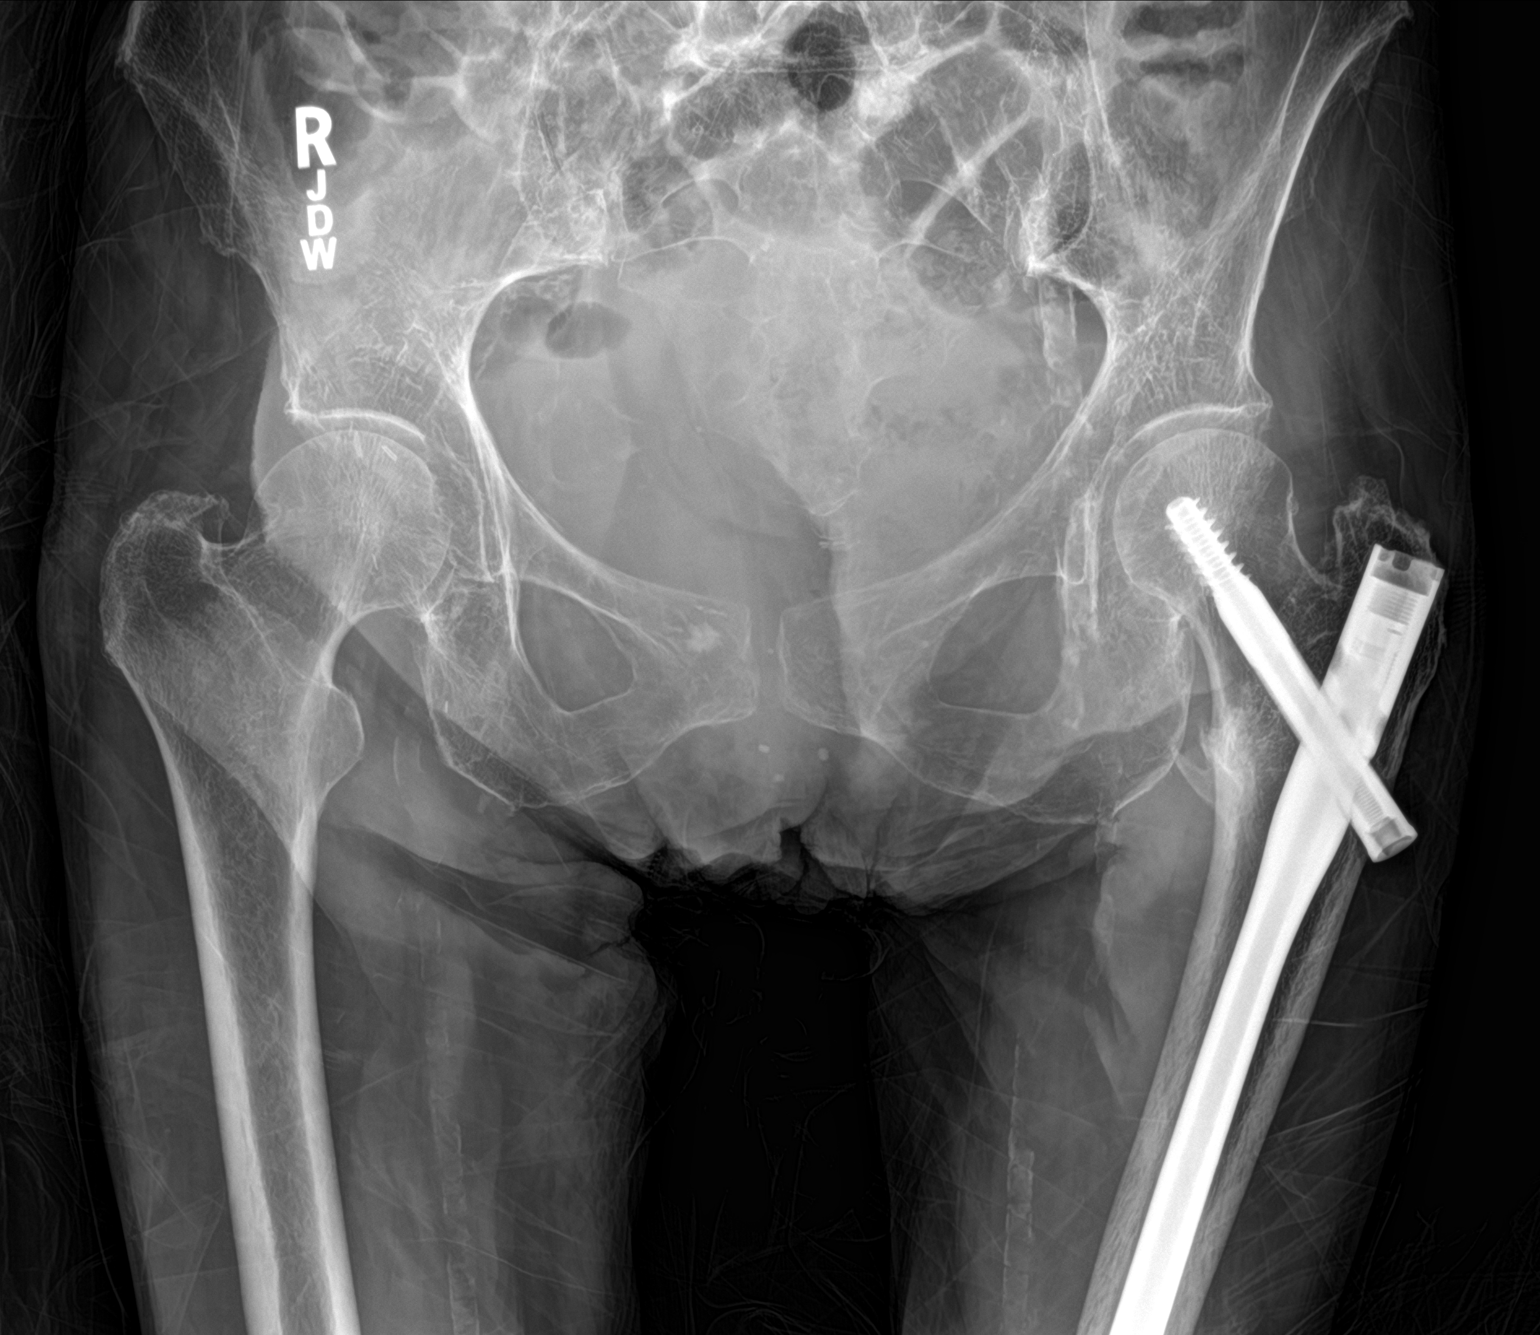

[hip ap]
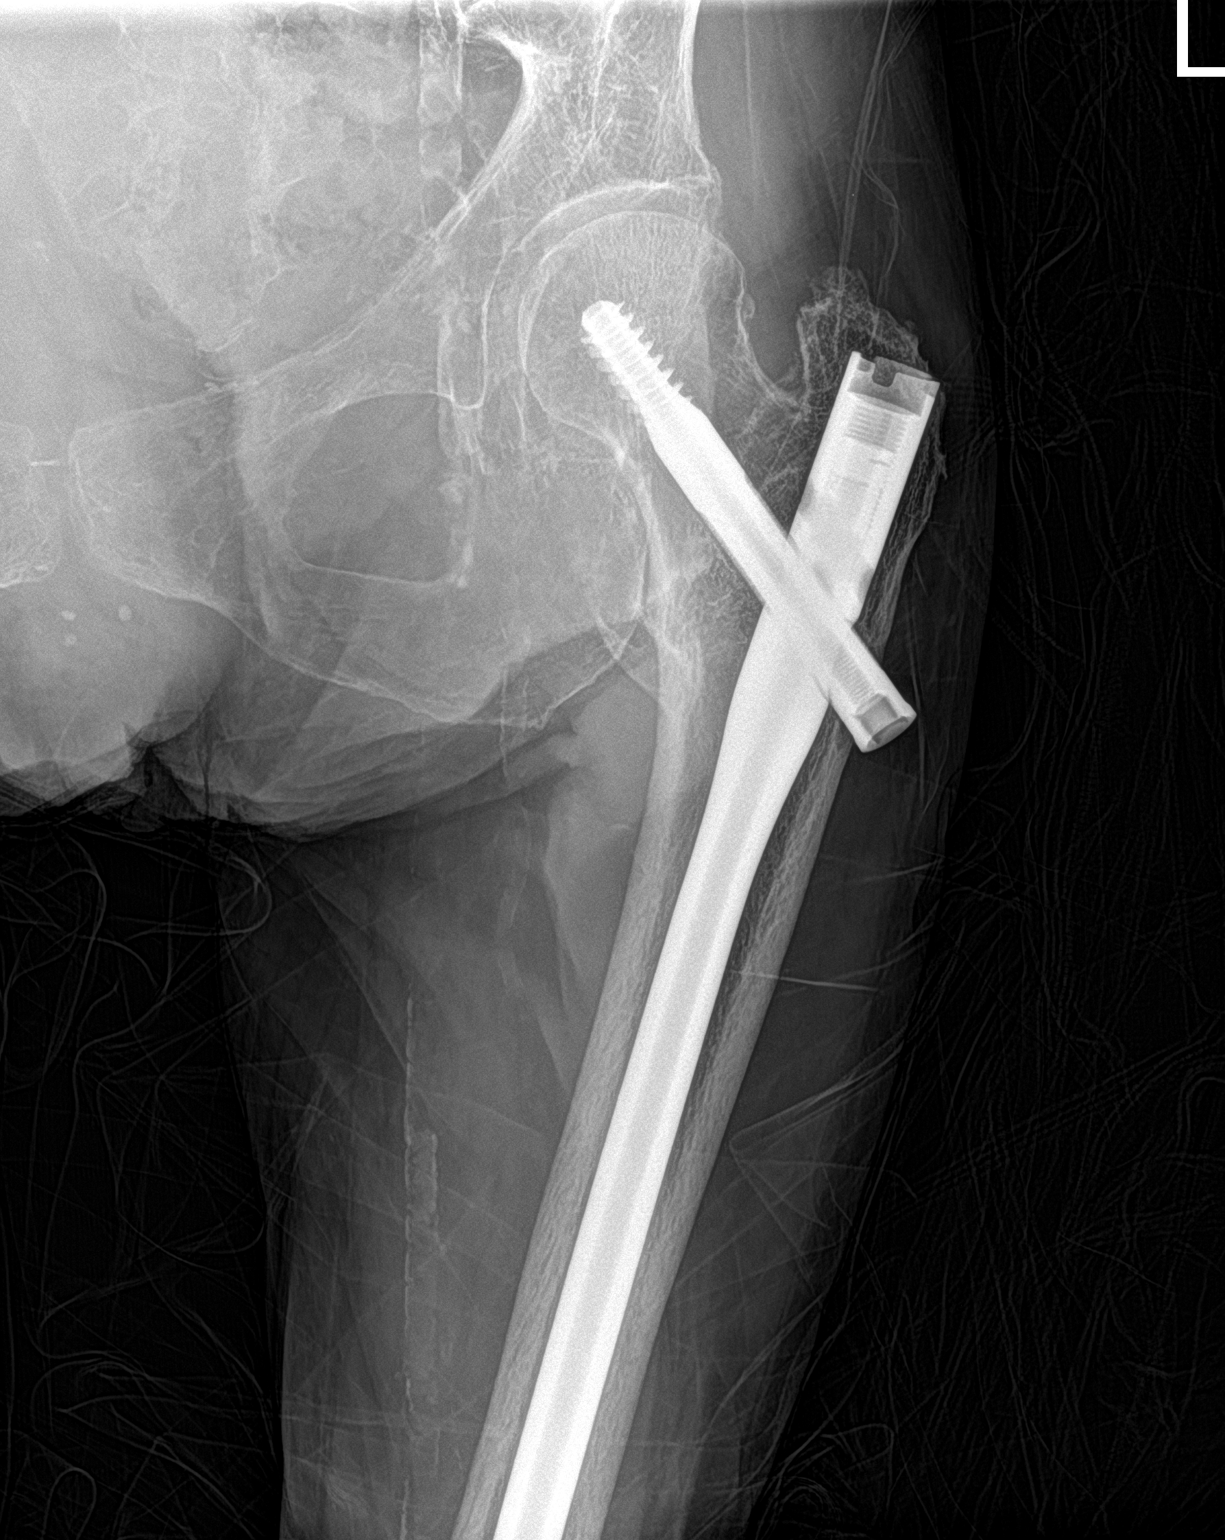

[hip lat]
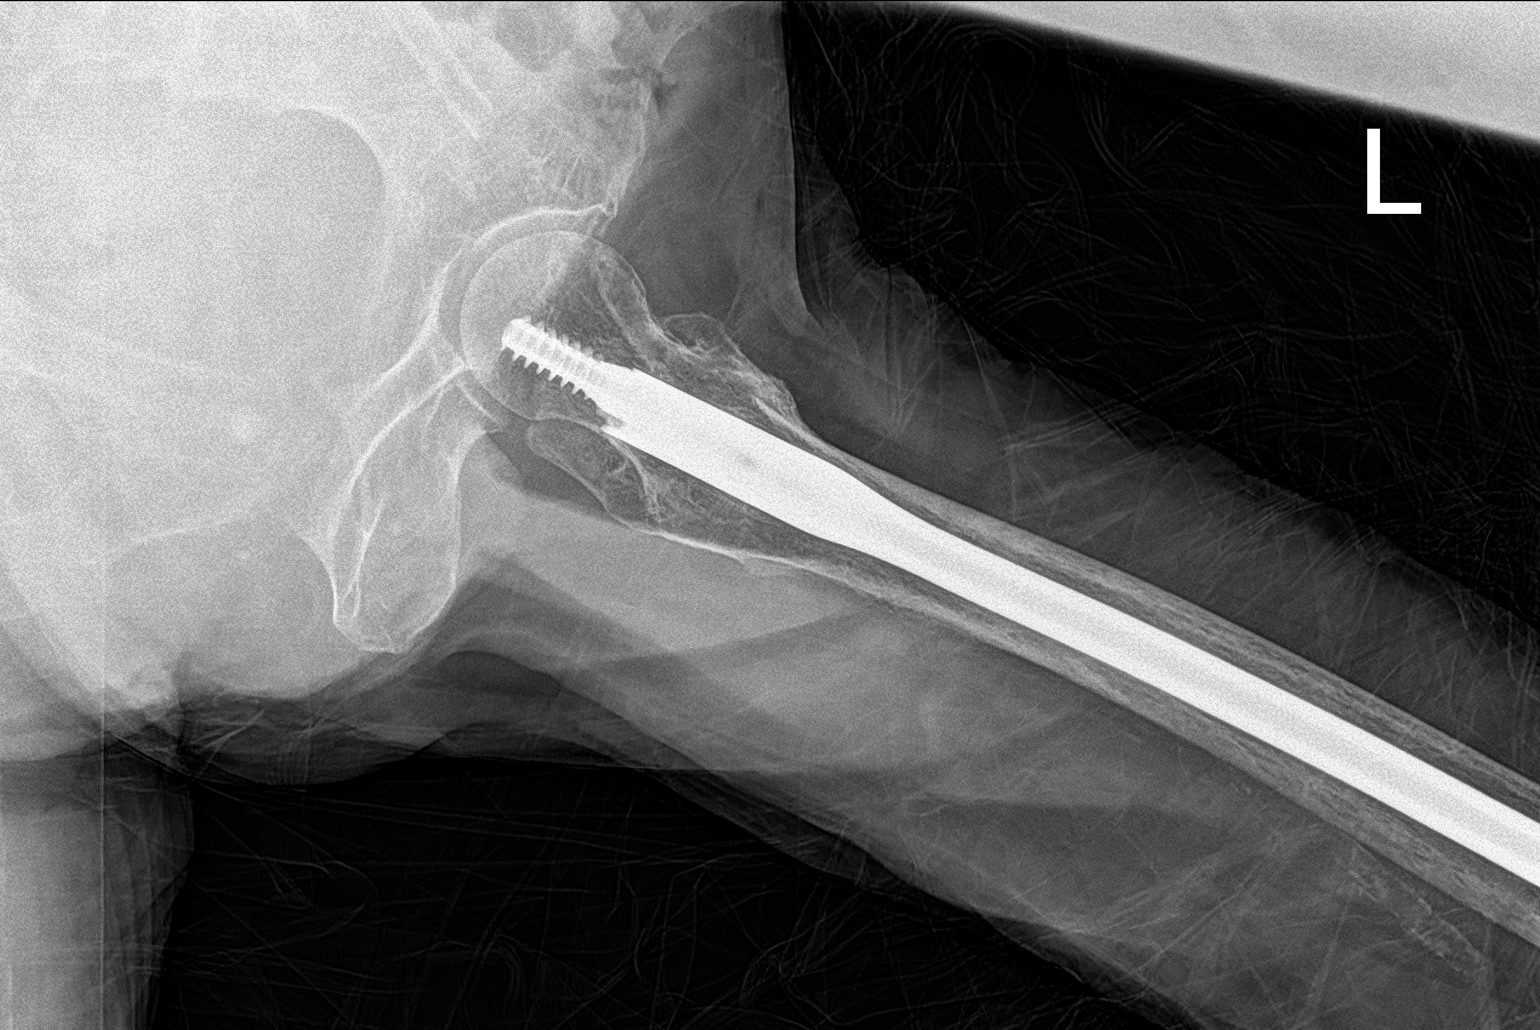

[3 of 3 positions shown; findings below may reference images not displayed]

FINDINGS: Compression screw and intramedullary rod are seen within the left
hip. No evidence of acute fracture or dislocation. Generalized
osteopenia noted as well as extensive peripheral vascular
calcification.
IMPRESSION: Internal fixation of old left hip fracture.  No acute findings.

## 2017-04-19 IMAGING — CR DG ANKLE COMPLETE 3+V*L*
3 series · 3 of 3 positions shown · non-contrast
Comparison: 10/12/2015

CLINICAL DATA: Ankle fracture, bimalleolar, closed, post close
reduction

EXAM:
LEFT ANKLE COMPLETE - 3+ VIEW

[AP]
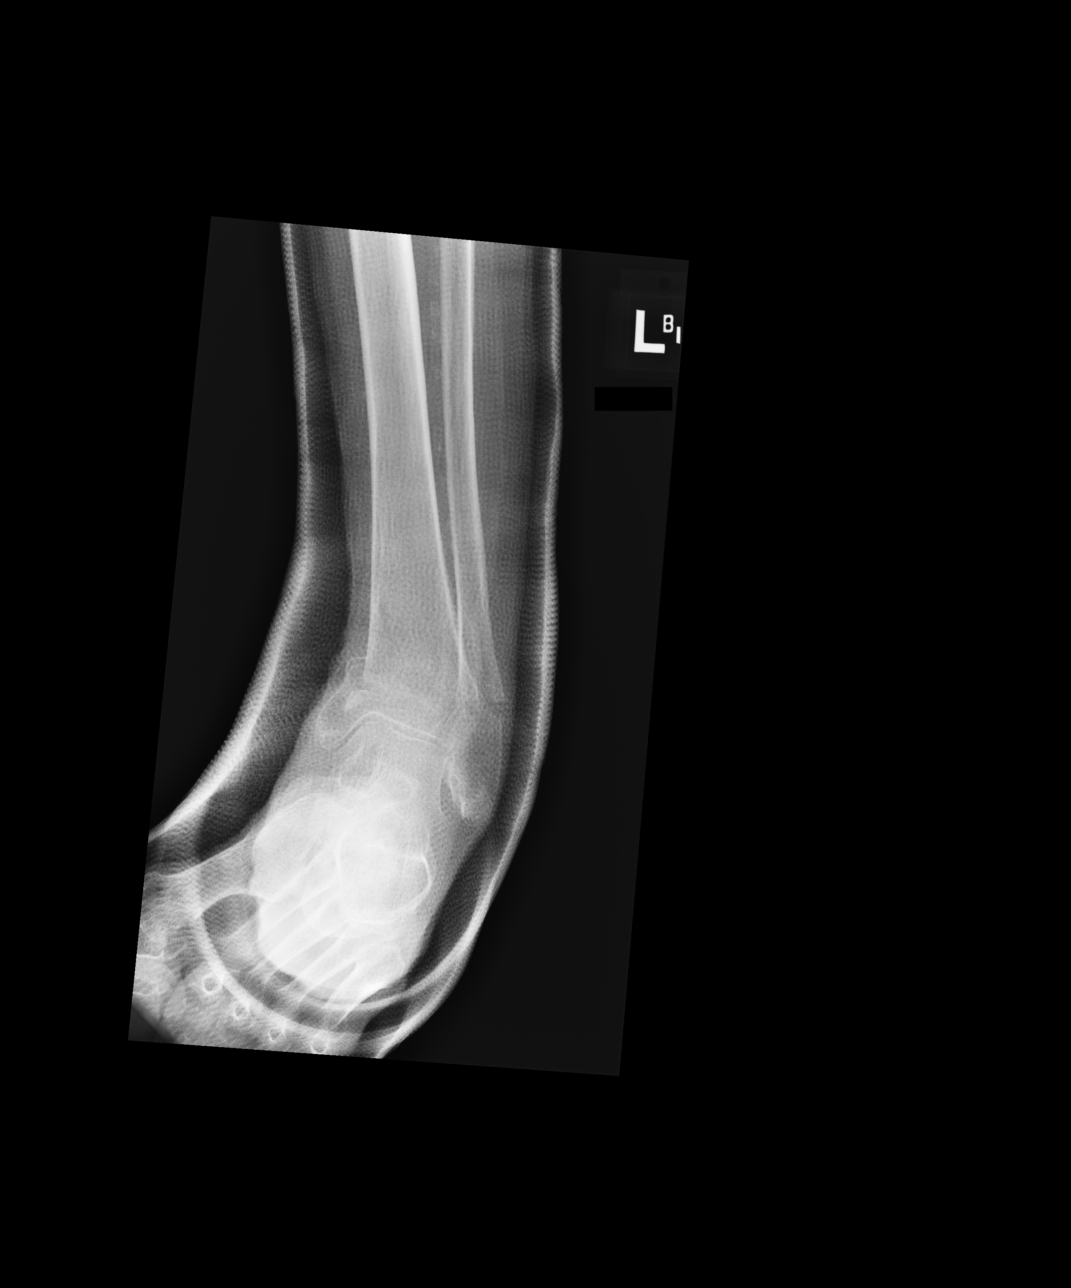

[ap obl int rot]
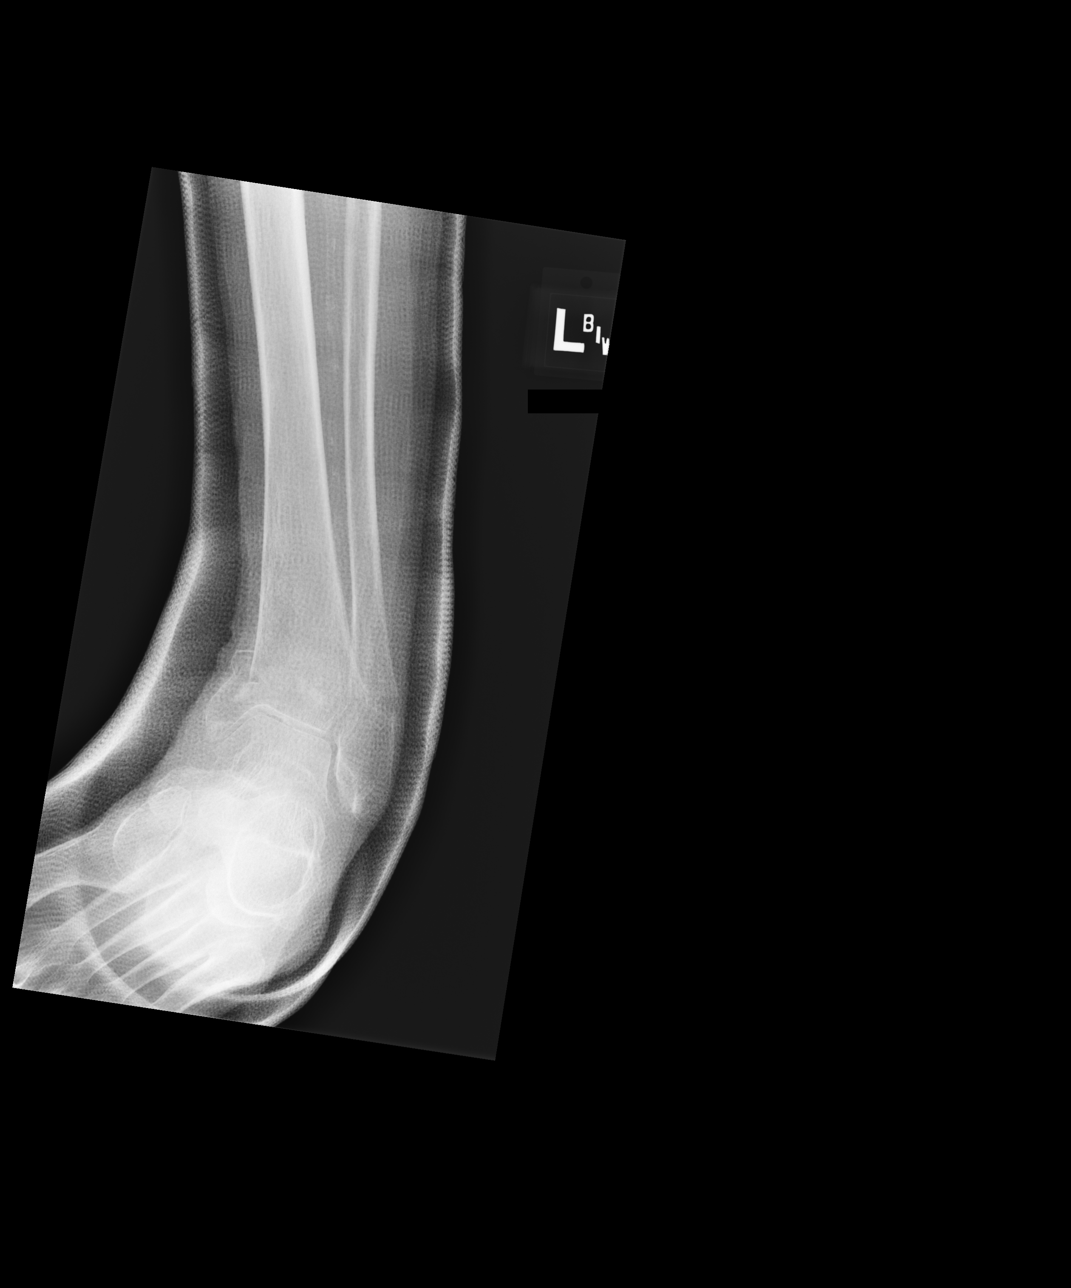

[lateral]
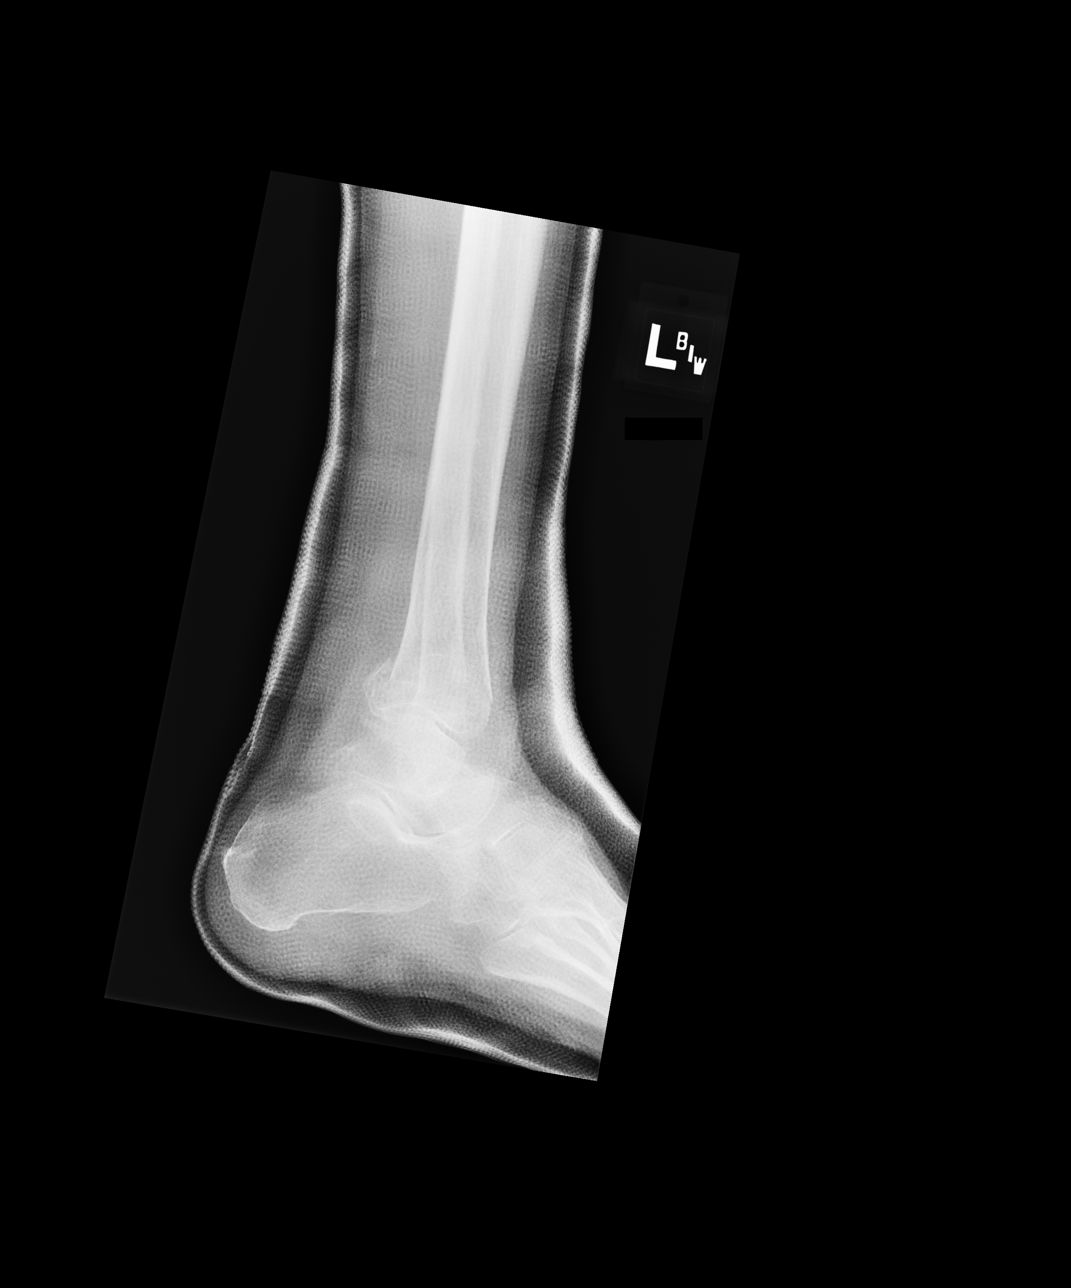

[3 of 3 positions shown; findings below may reference images not displayed]

FINDINGS: Fiberglass cast material obscures bone detail.

Again identified transverse mildly distracted fracture of the distal
fibula.

Again identified displaced fracture of distal tibial metaphysis.

Ankle joint space appears slightly narrowed.

Mild apex lateral angulation at both fractures.
IMPRESSION: Distal LEFT fibular and tibial fractures appear unchanged from the
previous exam.

## 2017-05-01 NOTE — Addendum Note (Signed)
Addended by: Lianne Cure A on: 05/01/2017 03:36 PM   Modules accepted: Orders

## 2017-07-01 ENCOUNTER — Ambulatory Visit (INDEPENDENT_AMBULATORY_CARE_PROVIDER_SITE_OTHER): Payer: Medicare Other | Admitting: Family

## 2017-07-01 ENCOUNTER — Encounter (INDEPENDENT_AMBULATORY_CARE_PROVIDER_SITE_OTHER): Payer: Self-pay | Admitting: Family

## 2017-07-01 ENCOUNTER — Ambulatory Visit (INDEPENDENT_AMBULATORY_CARE_PROVIDER_SITE_OTHER): Payer: Self-pay

## 2017-07-01 DIAGNOSIS — S99921A Unspecified injury of right foot, initial encounter: Secondary | ICD-10-CM

## 2017-07-01 NOTE — Progress Notes (Signed)
Office Visit Note   Patient: Michelle Gaines           Date of Birth: [birth year]           MRN: [id number] Visit Date: 07/01/2017              Requested by: Levin Erp, MD [place], [address], [place] [address] PCP: Levin Erp, MD  No chief complaint on file.     HPI: The patient is an 84 year old woman seen today for initial evaluation of right great toe pain following a mechanical fall from standing height 2 days ago. Has had pain, swelling and bruising to the right great toe since. Is in slipper today.   Assessment & Plan: Visit Diagnoses:  1. Injury of right great toe, initial encounter   2. Injury of great toe of left foot, initial encounter     Plan: follow up in office in 2 weeks for clinical re evaluation. Placed in post op shoe today. May WBAT.  Follow-Up Instructions: No Follow-up on file.   Ortho Exam  Patient is alert, oriented, no adenopathy, well-dressed, normal affect, normal respiratory effort. On exam the right great toe has ecchymosis and mild swelling. No obvious deformity. Is neurovascularly intact.   Imaging: No results found. No images are attached to the encounter.  Labs: Lab Results  Component Value Date   HGBA1C  06/25/2010    5.5 (NOTE)                                                                       According to the ADA Clinical Practice Recommendations for 2011, when HbA1c is used as a screening test:   >=6.5%   Diagnostic of Diabetes Mellitus           (if abnormal result  is confirmed)  5.7-6.4%   Increased risk of developing Diabetes Mellitus  References:Diagnosis and Classification of Diabetes Mellitus,Diabetes XBJY,7829,56(OZHYQ 1):S62-S69 and Standards of Medical Care in         Diabetes - 2011,Diabetes Care,2011,34  (Suppl 1):S11-S61.   HGBA1C  11/29/2009    5.6 (NOTE)                                                                       According to the ADA Clinical Practice Recommendations for 2011, when  HbA1c is used as a screening test:   >=6.5%   Diagnostic of Diabetes Mellitus           (if abnormal result  is confirmed)  5.7-6.4%   Increased risk of developing Diabetes Mellitus  References:Diagnosis and Classification of Diabetes Mellitus,Diabetes MVHQ,4696,29(BMWUX 1):S62-S69 and Standards of Medical Care in         Diabetes - 2011,Diabetes LKGM,0102,72  (Suppl 1):S11-S61.   REPTSTATUS 12/02/2009 FINAL 11/29/2009   CULT ESCHERICHIA COLI 11/29/2009   LABORGA ESCHERICHIA COLI 11/29/2009    @LABSALLVALUES (HGBA1)@  There is no height or weight on file to calculate BMI.  Orders:  Orders Placed This Encounter  Procedures  . XR Toe Great Right   No orders of the defined types were placed in this encounter.    Procedures: No procedures performed  Clinical Data: No additional findings.  ROS:  All other systems negative, except as noted in the HPI. Review of Systems  Constitutional: Negative for chills and fever.  Musculoskeletal: Positive for arthralgias and joint swelling.  Skin: Positive for color change. Negative for wound.    Objective: Vital Signs: There were no vitals taken for this visit.  Specialty Comments:  No specialty comments available.  PMFS History: Patient Active Problem List   Diagnosis Date Noted  . Ankle fracture 10/13/2015  . Closed left ankle fracture 10/12/2015  . GERD (gastroesophageal reflux disease) 10/12/2015  . Anxiety 10/12/2015  . Fall   . Hip fracture ([place]) 07/28/2014  . Protein-calorie malnutrition, severe ([place]) 07/28/2014  . Dizziness 04/24/2012  . Restless leg 03/15/2011  . Atrial flutter ([place]) 08/17/2010  . Essential hypertension 08/16/2010  . Coronary atherosclerosis 08/16/2010  . PVD 08/16/2010   Past Medical History:  Diagnosis Date  . Ankle fracture, left 09/2015  . Arthritis   . Atrial flutter ([place])   . Cancer ([place])    OVARIAN    1967  . Coronary artery disease   . Dysrhythmia    ATRIAL FLUTTER  . GERD  (gastroesophageal reflux disease)   . Hypertension   . Peripheral arterial disease ([place])   . PVD (peripheral vascular disease) (H[facility])     Family History  Problem Relation Age of Onset  . COPD Father   . Hypertension Father     Past Surgical History:  Procedure Laterality Date  . ABDOMINAL HYSTERECTOMY    . ATRIAL FLUTTER ABLATION N/A 05/22/2013   Procedure: ATRIAL FLUTTER ABLATION;  Surgeon: Evans Lance, MD;  Location: [facility] C[facility] LAB;  Service: Cardiovascular;  Laterality: N/A;  . BPG    . CAROTID STENT  11/30/09   LAD S/P Right CA DES  . FRACTURE SURGERY Left Feb. 2, 2016   Left Hip Fx   . GLIAYTE CATHERTER INSERTION  06/27/10  . INTRAMEDULLARY (IM) NAIL INTERTROCHANTERIC Left 07/28/2014   Procedure: Left Affixus Nail;  Surgeon: Marybelle Killings, MD;  Location: [facility];  Service: Orthopedics;  Laterality: Left;  . PR VEIN BYPASS GRAFT,AORTO-FEM-POP     Right    Social History   Occupational History  . Not on file  Tobacco Use  . Smoking status: Never Smoker  . Smokeless tobacco: Never Used  Substance and Sexual Activity  . Alcohol use: No  . Drug use: No  . Sexual activity: Not on file

## 2017-07-18 ENCOUNTER — Encounter (INDEPENDENT_AMBULATORY_CARE_PROVIDER_SITE_OTHER): Payer: Self-pay | Admitting: Orthopedic Surgery

## 2017-07-18 ENCOUNTER — Ambulatory Visit (INDEPENDENT_AMBULATORY_CARE_PROVIDER_SITE_OTHER): Payer: Medicare Other | Admitting: Orthopedic Surgery

## 2017-07-18 ENCOUNTER — Ambulatory Visit (INDEPENDENT_AMBULATORY_CARE_PROVIDER_SITE_OTHER): Payer: Medicare Other

## 2017-07-18 VITALS — Ht 60.0 in | Wt 83.0 lb

## 2017-07-18 DIAGNOSIS — M79674 Pain in right toe(s): Secondary | ICD-10-CM | POA: Diagnosis not present

## 2017-07-18 DIAGNOSIS — S92414D Nondisplaced fracture of proximal phalanx of right great toe, subsequent encounter for fracture with routine healing: Secondary | ICD-10-CM

## 2017-07-18 DIAGNOSIS — M67431 Ganglion, right wrist: Secondary | ICD-10-CM | POA: Diagnosis not present

## 2017-07-18 DIAGNOSIS — S92414A Nondisplaced fracture of proximal phalanx of right great toe, initial encounter for closed fracture: Secondary | ICD-10-CM | POA: Insufficient documentation

## 2017-07-18 NOTE — Progress Notes (Signed)
Office Visit Note   Patient: Michelle Gaines           Date of Birth: 04/03/1929           MRN: [id number] Visit Date: 07/18/2017              Requested by: Levin Erp, MD [place], [address], [place] [address] PCP: Levin Erp, MD  Chief Complaint  Patient presents with  . Right Foot - Follow-up    Gt fx      HPI: Patient presents in follow-up for 2 issues #1 a minimally displaced fracture of the proximal phalanx right great toe as well as an expanding ganglion cyst on the dorsum of the right wrist.  Patient states that the ganglion cyst has enlarged over the past month.  Assessment & Plan: Visit Diagnoses:  1. Great toe pain, right   2. Ganglion cyst of dorsum of right wrist   3. Closed nondisplaced fracture of proximal phalanx of right great toe with routine healing, subsequent encounter     Plan: The ganglion cyst was aspirated this had clear ganglion fluid consistent with a ganglion cyst.  She will use her hand as tolerated continue with the postoperative shoe on the right foot repeat three-view radiographs of the right great toe at follow-up.  Follow-Up Instructions: Return in about 4 weeks (around 08/15/2017).   Ortho Exam  Patient is alert, oriented, no adenopathy, well-dressed, normal affect, normal respiratory effort. Examination patient does have swelling over the right great toe she is tender to palpation over the proximal phalanx there is no angular deformities either with extension flexion varus or valgus.  Patient also has a large ganglion cyst over the dorsum of the right wrist the cyst measures 3 x 2 cm it is soft mobile no skin or color changes her hand moves well with no tendon deficits.  After informed consent and sterile prepping the ganglion was injected with 1 cc of 1% lidocaine plain the ganglion was aspirated with an 18-gauge needle and clear ganglion fluid was aspirated approximately 3 cc.  Imaging: Xr Toe Great Right  Result  Date: 07/18/2017 3 view radiographs of the right foot shows stable alignment of a minimally displaced fracture of the proximal phalanx right great toe.  No images are attached to the encounter.  Labs: Lab Results  Component Value Date   HGBA1C  06/25/2010    5.5 (NOTE)                                                                       According to the ADA Clinical Practice Recommendations for 2011, when HbA1c is used as a screening test:   >=6.5%   Diagnostic of Diabetes Mellitus           (if abnormal result  is confirmed)  5.7-6.4%   Increased risk of developing Diabetes Mellitus  References:Diagnosis and Classification of Diabetes Mellitus,Diabetes LOVF,6433,29(JJOAC 1):S62-S69 and Standards of Medical Care in         Diabetes - 2011,Diabetes Care,2011,34  (Suppl 1):S11-S61.   HGBA1C  11/29/2009    5.6 (NOTE)  According to the ADA Clinical Practice Recommendations for 2011, when HbA1c is used as a screening test:   >=6.5%   Diagnostic of Diabetes Mellitus           (if abnormal result  is confirmed)  5.7-6.4%   Increased risk of developing Diabetes Mellitus  References:Diagnosis and Classification of Diabetes Mellitus,Diabetes MLYY,5035,46(FKCLE 1):S62-S69 and Standards of Medical Care in         Diabetes - 2011,Diabetes XNTZ,0017,49  (Suppl 1):S11-S61.   REPTSTATUS 12/02/2009 FINAL 11/29/2009   CULT ESCHERICHIA COLI 11/29/2009   LABORGA ESCHERICHIA COLI 11/29/2009    @LABSALLVALUES (HGBA1)@  Body mass index is 16.21 kg/m.  Orders:  Orders Placed This Encounter  Procedures  . XR Toe Great Right   No orders of the defined types were placed in this encounter.    Procedures: No procedures performed  Clinical Data: No additional findings.  ROS:  All other systems negative, except as noted in the HPI. Review of Systems  Objective: Vital Signs: Ht 5' (1.524 m)   Wt 83 lb (37.6 kg)   BMI 16.21 kg/m    Specialty Comments:  No specialty comments available.  PMFS History: Patient Active Problem List   Diagnosis Date Noted  . Ganglion cyst of dorsum of right wrist 07/18/2017  . Closed nondisplaced fracture of proximal phalanx of right great toe 07/18/2017  . Ankle fracture 10/13/2015  . Closed left ankle fracture 10/12/2015  . GERD (gastroesophageal reflux disease) 10/12/2015  . Anxiety 10/12/2015  . Fall   . Hip fracture ([place]) 07/28/2014  . Protein-calorie malnutrition, severe ([place]) 07/28/2014  . Dizziness 04/24/2012  . Restless leg 03/15/2011  . Atrial flutter ([place]) 08/17/2010  . Essential hypertension 08/16/2010  . Coronary atherosclerosis 08/16/2010  . PVD 08/16/2010   Past Medical History:  Diagnosis Date  . Ankle fracture, left 09/2015  . Arthritis   . Atrial flutter ([place])   . Cancer ([place])    OVARIAN    1967  . Coronary artery disease   . Dysrhythmia    ATRIAL FLUTTER  . GERD (gastroesophageal reflux disease)   . Hypertension   . Peripheral arterial disease ([place])   . PVD (peripheral vascular disease) (HCC)     Family History  Problem Relation Age of Onset  . COPD Father   . Hypertension Father     Past Surgical History:  Procedure Laterality Date  . ABDOMINAL HYSTERECTOMY    . ATRIAL FLUTTER ABLATION N/A 05/22/2013   Procedure: ATRIAL FLUTTER ABLATION;  Surgeon: Evans Lance, MD;  Location: [facility] CATH LAB;  Service: Cardiovascular;  Laterality: N/A;  . BPG    . CAROTID STENT  11/30/09   LAD S/P Right CA DES  . FRACTURE SURGERY Left Feb. 2, 2016   Left Hip Fx   . GLIAYTE CATHERTER INSERTION  06/27/10  . INTRAMEDULLARY (IM) NAIL INTERTROCHANTERIC Left 07/28/2014   Procedure: Left Affixus Nail;  Surgeon: Marybelle Killings, MD;  Location: [facility];  Service: Orthopedics;  Laterality: Left;  . PR VEIN BYPASS GRAFT,AORTO-FEM-POP     Right    Social History   Occupational History  . Not on file  Tobacco Use  . Smoking status: Never Smoker  . Smokeless tobacco: Never  Used  Substance and Sexual Activity  . Alcohol use: No  . Drug use: No  . Sexual activity: Not on file

## 2017-08-15 ENCOUNTER — Ambulatory Visit (INDEPENDENT_AMBULATORY_CARE_PROVIDER_SITE_OTHER): Payer: Medicare Other | Admitting: Orthopedic Surgery

## 2017-08-15 ENCOUNTER — Ambulatory Visit (INDEPENDENT_AMBULATORY_CARE_PROVIDER_SITE_OTHER): Payer: Self-pay

## 2017-08-15 ENCOUNTER — Encounter (INDEPENDENT_AMBULATORY_CARE_PROVIDER_SITE_OTHER): Payer: Self-pay | Admitting: Orthopedic Surgery

## 2017-08-15 VITALS — Ht 60.0 in | Wt 83.0 lb

## 2017-08-15 DIAGNOSIS — M67431 Ganglion, right wrist: Secondary | ICD-10-CM

## 2017-08-15 DIAGNOSIS — S92414D Nondisplaced fracture of proximal phalanx of right great toe, subsequent encounter for fracture with routine healing: Secondary | ICD-10-CM

## 2017-08-15 DIAGNOSIS — M79674 Pain in right toe(s): Secondary | ICD-10-CM

## 2017-08-15 NOTE — Progress Notes (Signed)
Office Visit Note   Patient: Michelle Gaines           Date of Birth: [birth year]           MRN: [id number] Visit Date: 08/15/2017              Requested by: Levin Erp, MD [place], [address], [place] [address] PCP: Levin Erp, MD  Chief Complaint  Patient presents with  . Right Foot - Follow-up    Great toe closed nondisplaced fx proximal phalanx  . Right Wrist - Follow-up    S/p aspiration of ganglion cyst 07/18/17      HPI: Patient presents in follow-up for 2 issues. A minimally displaced fracture of the proximal phalanx right great toe as well as an expanding ganglion cyst on the dorsum of the right wrist.  Patient states that the ganglion cyst feels much better since being aspirated at the last visit. Pleased with reduction in pain and discomfort to the wrist.  The right great toe continues to be painful. Complains of discomfort with the post op shoe but does not have any other stiff shoe wear.   Assessment & Plan: Visit Diagnoses:  1. Closed nondisplaced fracture of proximal phalanx of right great toe with routine healing, subsequent encounter   2. Great toe pain, right   3. Ganglion cyst of dorsum of right wrist     Plan: She will use her hand as tolerated continue with the postoperative shoe on the right foot repeat three-view radiographs of the right great toe at follow-up.  Follow-Up Instructions: Return in about 3 weeks (around 09/05/2017).   Ortho Exam  Patient is alert, oriented, no adenopathy, well-dressed, normal affect, normal respiratory effort. Examination patient does have swelling over the right great toe she is tender to palpation over the proximal phalanx. 1+ pitting edema to RLE. No tenderness to calf. No erythema or cellulitis.   Imaging: No results found. No images are attached to the encounter.  Labs: Lab Results  Component Value Date   HGBA1C  06/25/2010    5.5 (NOTE)                                                                        According to the ADA Clinical Practice Recommendations for 2011, when HbA1c is used as a screening test:   >=6.5%   Diagnostic of Diabetes Mellitus           (if abnormal result  is confirmed)  5.7-6.4%   Increased risk of developing Diabetes Mellitus  References:Diagnosis and Classification of Diabetes Mellitus,Diabetes FXTK,2409,73(ZHGDJ 1):S62-S69 and Standards of Medical Care in         Diabetes - 2011,Diabetes Care,2011,34  (Suppl 1):S11-S61.   HGBA1C  11/29/2009    5.6 (NOTE)                                                                       According to the [place]  Recommendations for 2011, when HbA1c is used as a screening test:   >=6.5%   Diagnostic of Diabetes Mellitus           (if abnormal result  is confirmed)  5.7-6.4%   Increased risk of developing Diabetes Mellitus  References:Diagnosis and Classification of Diabetes Mellitus,Diabetes ZMOQ,9476,54(YTKPT 1):S62-S69 and Standards of Medical Care in         Diabetes - 2011,Diabetes WSFK,8127,51  (Suppl 1):S11-S61.   REPTSTATUS 12/02/2009 FINAL 11/29/2009   CULT ESCHERICHIA COLI 11/29/2009   LABORGA ESCHERICHIA COLI 11/29/2009    @LABSALLVALUES (HGBA1)@  Body mass index is 16.21 kg/m.  Orders:  Orders Placed This Encounter  Procedures  . XR Toe Great Right   No orders of the defined types were placed in this encounter.    Procedures: No procedures performed  Clinical Data: No additional findings.  ROS:  All other systems negative, except as noted in the HPI. Review of Systems  Constitutional: Negative for chills and fever.  Cardiovascular: Positive for leg swelling.  Musculoskeletal: Positive for arthralgias, gait problem and joint swelling.    Objective: Vital Signs: Ht 5' (1.524 m)   Wt 83 lb (37.6 kg)   BMI 16.21 kg/m   Specialty Comments:  No specialty comments available.  PMFS History: Patient Active Problem List   Diagnosis Date Noted  . Ganglion cyst of dorsum of  right wrist 07/18/2017  . Closed nondisplaced fracture of proximal phalanx of right great toe 07/18/2017  . Ankle fracture 10/13/2015  . Closed left ankle fracture 10/12/2015  . GERD (gastroesophageal reflux disease) 10/12/2015  . Anxiety 10/12/2015  . Fall   . Hip fracture ([place]) 07/28/2014  . Protein-calorie malnutrition, severe ([place]) 07/28/2014  . Dizziness 04/24/2012  . Restless leg 03/15/2011  . Atrial flutter ([place]) 08/17/2010  . Essential hypertension 08/16/2010  . Coronary atherosclerosis 08/16/2010  . PVD 08/16/2010   Past Medical History:  Diagnosis Date  . Ankle fracture, left 09/2015  . Arthritis   . Atrial flutter ([place])   . Cancer ([place])    OVARIAN    1967  . Coronary artery disease   . Dysrhythmia    ATRIAL FLUTTER  . GERD (gastroesophageal reflux disease)   . Hypertension   . Peripheral arterial disease ([place])   . PVD (peripheral vascular disease) (H[facility])     Family History  Problem Relation Age of Onset  . COPD Father   . Hypertension Father     Past Surgical History:  Procedure Laterality Date  . ABDOMINAL HYSTERECTOMY    . ATRIAL FLUTTER ABLATION N/A 05/22/2013   Procedure: ATRIAL FLUTTER ABLATION;  Surgeon: Evans Lance, MD;  Location: [facility] CATH LAB;  Service: Cardiovascular;  Laterality: N/A;  . BPG    . CAROTID STENT  11/30/09   LAD S/P Right CA DES  . FRACTURE SURGERY Left Feb. 2, 2016   Left Hip Fx   . GLIAYTE CATHERTER INSERTION  06/27/10  . INTRAMEDULLARY (IM) NAIL INTERTROCHANTERIC Left 07/28/2014   Procedure: Left Affixus Nail;  Surgeon: Marybelle Killings, MD;  Location: [facility];  Service: Orthopedics;  Laterality: Left;  . PR VEIN BYPASS GRAFT,AORTO-FEM-POP     Right    Social History   Occupational History  . Not on file  Tobacco Use  . Smoking status: Never Smoker  . Smokeless tobacco: Never Used  Substance and Sexual Activity  . Alcohol use: No  . Drug use: No  . Sexual activity: Not on file

## 2017-08-29 ENCOUNTER — Ambulatory Visit (INDEPENDENT_AMBULATORY_CARE_PROVIDER_SITE_OTHER): Payer: Medicare Other | Admitting: Orthopedic Surgery

## 2017-08-29 ENCOUNTER — Ambulatory Visit (INDEPENDENT_AMBULATORY_CARE_PROVIDER_SITE_OTHER): Payer: Medicare Other

## 2017-08-29 ENCOUNTER — Encounter (INDEPENDENT_AMBULATORY_CARE_PROVIDER_SITE_OTHER): Payer: Self-pay | Admitting: Orthopedic Surgery

## 2017-08-29 VITALS — Ht 60.0 in | Wt 83.0 lb

## 2017-08-29 DIAGNOSIS — M79674 Pain in right toe(s): Secondary | ICD-10-CM | POA: Diagnosis not present

## 2017-08-29 DIAGNOSIS — S92414D Nondisplaced fracture of proximal phalanx of right great toe, subsequent encounter for fracture with routine healing: Secondary | ICD-10-CM | POA: Diagnosis not present

## 2017-08-29 NOTE — Progress Notes (Signed)
Office Visit Note   Patient: Michelle Gaines           Date of Birth: 26-Apr-1929           MRN: [id number] Visit Date: 08/29/2017              Requested by: Levin Erp, MD [place], [address], [place] [address] PCP: Levin Erp, MD  Chief Complaint  Patient presents with  . Right Foot - Follow-up    S/p GT closed nondisplaced fx proximal phalanx      HPI: Patient is seen in follow-up for nondisplaced fracture proximal phalanx right great toe.  Patient states she is still symptomatic with weightbearing.  Assessment & Plan: Visit Diagnoses:  1. Great toe pain, right   2. Closed nondisplaced fracture of proximal phalanx of right great toe with routine healing, subsequent encounter     Plan: Recommend continue the postoperative shoe for a month.  Recommended not driving.  3 view radiographs of the right foot at follow-up.  Follow-Up Instructions: Return in about 4 weeks (around 09/26/2017).   Ortho Exam  Patient is alert, oriented, no adenopathy, well-dressed, normal affect, normal respiratory effort. Examination patient is in her postoperative shoe.  She complains of pain with weightbearing.  She does have an antalgic gait.  Recommended continuing with her postoperative shoe and recommend against driving.  Imaging: Xr Toe Great Right  Result Date: 08/29/2017 3 view radiographs of the right great toe show some calcification of the digital vessels.  The toe was well aligned without callus formation without displacement.  No images are attached to the encounter.  Labs: Lab Results  Component Value Date   HGBA1C  06/25/2010    5.5 (NOTE)                                                                       According to the ADA Clinical Practice Recommendations for 2011, when HbA1c is used as a screening test:   >=6.5%   Diagnostic of Diabetes Mellitus           (if abnormal result  is confirmed)  5.7-6.4%   Increased risk of developing Diabetes  [place]  References:Diagnosis and Classification of Diabetes Mellitus,Diabetes NIOE,7035,00(XFGHW 1):S62-S69 and Standards of Medical Care in         Diabetes - 2011,Diabetes Care,2011,34  (Suppl 1):S11-S61.   HGBA1C  11/29/2009    5.6 (NOTE)                                                                       According to the ADA Clinical Practice Recommendations for 2011, when HbA1c is used as a screening test:   >=6.5%   Diagnostic of Diabetes Mellitus           (if abnormal result  is confirmed)  5.7-6.4%   Increased risk of developing Diabetes Mellitus  References:Diagnosis and Classification of Diabetes Mellitus,Diabetes EXHB,7169,67(ELFYB 1):S62-S69 and Standards of Medical Care in  Diabetes - 2011,Diabetes Care,2011,34  (Suppl 1):S11-S61.   REPTSTATUS 12/02/2009 FINAL 11/29/2009   CULT ESCHERICHIA COLI 11/29/2009   LABORGA ESCHERICHIA COLI 11/29/2009    @LABSALLVALUES (HGBA1)@  Body mass index is 16.21 kg/m.  Orders:  Orders Placed This Encounter  Procedures  . XR Toe Great Right   No orders of the defined types were placed in this encounter.    Procedures: No procedures performed  Clinical Data: No additional findings.  ROS:  All other systems negative, except as noted in the HPI. Review of Systems  Objective: Vital Signs: Ht 5' (1.524 m)   Wt 83 lb (37.6 kg)   BMI 16.21 kg/m   Specialty Comments:  No specialty comments available.  PMFS History: Patient Active Problem List   Diagnosis Date Noted  . Ganglion cyst of dorsum of right wrist 07/18/2017  . Closed nondisplaced fracture of proximal phalanx of right great toe 07/18/2017  . Ankle fracture 10/13/2015  . Closed left ankle fracture 10/12/2015  . GERD (gastroesophageal reflux disease) 10/12/2015  . Anxiety 10/12/2015  . Fall   . Hip fracture ([place]) 07/28/2014  . Protein-calorie malnutrition, severe ([place]) 07/28/2014  . Dizziness 04/24/2012  . Restless leg 03/15/2011  . Atrial flutter ([place])  08/17/2010  . Essential hypertension 08/16/2010  . Coronary atherosclerosis 08/16/2010  . PVD 08/16/2010   Past Medical History:  Diagnosis Date  . Ankle fracture, left 09/2015  . Arthritis   . Atrial flutter ([place])   . Cancer ([place])    OVARIAN    1967  . Coronary artery disease   . Dysrhythmia    ATRIAL FLUTTER  . GERD (gastroesophageal reflux disease)   . Hypertension   . Peripheral arterial disease ([place])   . PVD (peripheral vascular disease) ([facility])     Family History  Problem Relation Age of Onset  . COPD Father   . Hypertension Father     Past Surgical History:  Procedure Laterality Date  . ABDOMINAL HYSTERECTOMY    . ATRIAL FLUTTER ABLATION N/A 05/22/2013   Procedure: ATRIAL FLUTTER ABLATION;  Surgeon: Evans Lance, MD;  Location: [facility] CATH LAB;  Service: Cardiovascular;  Laterality: N/A;  . BPG    . CAROTID STENT  11/30/09   LAD S/P Right CA DES  . FRACTURE SURGERY Left Feb. 2, 2016   Left Hip Fx   . GLIAYTE CATHERTER INSERTION  06/27/10  . INTRAMEDULLARY (IM) NAIL INTERTROCHANTERIC Left 07/28/2014   Procedure: Left Affixus Nail;  Surgeon: Marybelle Killings, MD;  Location: [facility];  Service: Orthopedics;  Laterality: Left;  . PR VEIN BYPASS GRAFT,AORTO-FEM-POP     Right    Social History   Occupational History  . Not on file  Tobacco Use  . Smoking status: Never Smoker  . Smokeless tobacco: Never Used  Substance and Sexual Activity  . Alcohol use: No  . Drug use: No  . Sexual activity: Not on file

## 2017-10-01 ENCOUNTER — Ambulatory Visit: Payer: Medicare Other | Admitting: Internal Medicine

## 2017-10-01 ENCOUNTER — Encounter: Payer: Self-pay | Admitting: Internal Medicine

## 2017-10-01 VITALS — BP 136/68 | HR 74 | Ht 62.0 in | Wt 86.8 lb

## 2017-10-01 DIAGNOSIS — I4892 Unspecified atrial flutter: Secondary | ICD-10-CM | POA: Diagnosis not present

## 2017-10-01 DIAGNOSIS — I10 Essential (primary) hypertension: Secondary | ICD-10-CM

## 2017-10-01 NOTE — Patient Instructions (Signed)

## 2017-10-01 NOTE — Progress Notes (Signed)
HPI [name] returns today for ongoing evaluation and management of her atrial arrhythmias, HTN, and diastolic heart failure. Her symptoms are well controlled. She admits to some dietary indiscretion and swelling in her legs. No chest pain or sob.  Allergies  Allergen Reactions  . Fexofenadine Shortness Of Breath  . Cephalosporins Rash  . Diltiazem Other (See Comments)    Dizziness   . Penicillins Nausea And Vomiting, Swelling and Rash  . Amlodipine Other (See Comments)  . Anaprox [Naproxen Sodium] Other (See Comments)  . Benzonatate Other (See Comments)  . Cephalexin Other (See Comments)  . Clindamycin/Lincomycin Other (See Comments)  . Clorazepate Other (See Comments)  . Codeine Other (See Comments)  . Doxycycline Other (See Comments)  . Hydralazine Other (See Comments)  . Labetalol Other (See Comments)  . Lisinopril Other (See Comments)  . Phenylephrine Other (See Comments)  . Phenytoin Other (See Comments)  . Zolpidem Other (See Comments)     Current Outpatient Medications  Medication Sig Dispense Refill  . BETA CAROTENE PO Take 1 tablet by mouth daily.    . carvedilol (COREG) 6.25 MG tablet Take 1 tablet (6.25 mg total) by mouth 2 (two) times daily with a meal. Okay to take one additional tablet daily as needed SBP > 150. 180 tablet 3  . cyanocobalamin 100 MCG tablet Take 100 mcg by mouth daily.    . Flaxseed, Linseed, (FLAX SEED OIL PO) Take 1 tablet by mouth daily.    . Homeopathic Products (SLEEP MEDICINE) TABS Take 1 tablet by mouth at bedtime.    Marland Kitchen LORazepam (ATIVAN) 1 MG tablet     . Lutein 20 MG TABS Take 20 mg by mouth daily.    . magnesium gluconate (MAGONATE) 500 MG tablet Take 500 mg by mouth 2 (two) times daily.      . multivitamin (THERAGRAN) per tablet Take 1 tablet by mouth daily.      . nitroGLYCERIN (NITROSTAT) 0.4 MG SL tablet Place 1 tablet (0.4 mg total) under the tongue every 5 (five) minutes as needed for chest pain. 25 tablet 12  .  Potassium 99 MG TABS Take 1 tablet by mouth daily.      Marland Kitchen Ubiquinol 50 MG CAPS Take 50 mg by mouth 2 (two) times daily.    Marland Kitchen VITAMIN D, CHOLECALCIFEROL, PO Take 1,000 mg by mouth daily.     . vitamin E 400 UNIT capsule Take 400 Units by mouth daily.     No current facility-administered medications for this visit.      Past Medical History:  Diagnosis Date  . Ankle fracture, left 09/2015  . Arthritis   . Atrial flutter ([place])   . Cancer ([place])    OVARIAN    1967  . Coronary artery disease   . Dysrhythmia    ATRIAL FLUTTER  . GERD (gastroesophageal reflux disease)   . Hypertension   . Peripheral arterial disease ([place])   . PVD (peripheral vascular disease) ([place])     ROS:   All systems reviewed and negative except as noted in the HPI.   Past Surgical History:  Procedure Laterality Date  . ABDOMINAL HYSTERECTOMY    . ATRIAL FLUTTER ABLATION N/A 05/22/2013   Procedure: ATRIAL FLUTTER ABLATION;  Surgeon: Evans Lance, MD;  Location: [facility] CATH LAB;  Service: Cardiovascular;  Laterality: N/A;  . BPG    . CAROTID STENT  11/30/09   LAD S/P Right CA DES  . FRACTURE SURGERY Left  Feb. 2, 2016   Left Hip Fx   . GLIAYTE CATHERTER INSERTION  06/27/10  . INTRAMEDULLARY (IM) NAIL INTERTROCHANTERIC Left 07/28/2014   Procedure: Left Affixus Nail;  Surgeon: Marybelle Killings, MD;  Location: [place];  Service: Orthopedics;  Laterality: Left;  . PR VEIN BYPASS GRAFT,AORTO-FEM-POP     Right      Family History  Problem Relation Age of Onset  . COPD Father   . Hypertension Father      Social History   Socioeconomic History  . Marital status: Married    Spouse name: Not on file  . Number of children: Not on file  . Years of education: Not on file  . Highest education level: Not on file  Occupational History  . Not on file  Social Needs  . Financial resource strain: Not on file  . Food insecurity:    Worry: Not on file    Inability: Not on file  . Transportation needs:    Medical: Not on  file    Non-medical: Not on file  Tobacco Use  . Smoking status: Never Smoker  . Smokeless tobacco: Never Used  Substance and Sexual Activity  . Alcohol use: No  . Drug use: No  . Sexual activity: Not on file  Lifestyle  . Physical activity:    Days per week: Not on file    Minutes per session: Not on file  . Stress: Not on file  Relationships  . Social connections:    Talks on phone: Not on file    Gets together: Not on file    Attends religious service: Not on file    Active member of club or organization: Not on file    Attends meetings of clubs or organizations: Not on file    Relationship status: Not on file  . Intimate partner violence:    Fear of current or ex partner: Not on file    Emotionally abused: Not on file    Physically abused: Not on file    Forced sexual activity: Not on file  Other Topics Concern  . Not on file  Social History Narrative  . Not on file     BP 136/68   Pulse 74   Ht 5\' 2"  (1.575 m)   Wt 86 lb 12.8 oz (39.4 kg)   SpO2 98%   BMI 15.88 kg/m   Physical Exam:  Well appearing 84 yo woman, NAD HEENT: Unremarkable Neck:  6 cm JVD, no thyromegally Lymphatics:  No adenopathy Back:  No CVA tenderness Lungs:  Clear with no wheezes HEART:  Regular rate rhythm, no murmurs, no rubs, no clicks Abd:  soft, positive bowel sounds, no organomegally, no rebound, no guarding Ext:  2 plus pulses, no edema, no cyanosis, no clubbing Skin:  No rashes no nodules Neuro:  CN II through XII intact, motor grossly intact  EKG -NSR with PAC's  Assess/Plan: 1. Atrial fib - she is maintaining NSR. She refuses systemic anti-coagulation 2. HTN - her blood pressure is reasonably well controlled. I have asked her to maintain a low sodium diet. 3. PAC's - she is asymptomatic. She is encouraged to avoid caffeine. 4. Diastolic heart failure - she has some peripheral edema but is not dyspneic. I have asked her to keep her legs elevated and consider support  stockings  Mikle Bosworth.D.

## 2017-10-02 ENCOUNTER — Ambulatory Visit (INDEPENDENT_AMBULATORY_CARE_PROVIDER_SITE_OTHER): Payer: Medicare Other | Admitting: Orthopedic Surgery

## 2017-10-07 ENCOUNTER — Encounter (INDEPENDENT_AMBULATORY_CARE_PROVIDER_SITE_OTHER): Payer: Self-pay | Admitting: Orthopedic Surgery

## 2017-10-07 ENCOUNTER — Ambulatory Visit (INDEPENDENT_AMBULATORY_CARE_PROVIDER_SITE_OTHER): Payer: Medicare Other

## 2017-10-07 ENCOUNTER — Ambulatory Visit (INDEPENDENT_AMBULATORY_CARE_PROVIDER_SITE_OTHER): Payer: Medicare Other | Admitting: Orthopedic Surgery

## 2017-10-07 DIAGNOSIS — M79674 Pain in right toe(s): Secondary | ICD-10-CM | POA: Diagnosis not present

## 2017-10-07 DIAGNOSIS — S92414D Nondisplaced fracture of proximal phalanx of right great toe, subsequent encounter for fracture with routine healing: Secondary | ICD-10-CM | POA: Diagnosis not present

## 2017-10-07 NOTE — Progress Notes (Signed)
Office Visit Note   Patient: Michelle Gaines           Date of Birth: Nov 02, 1928           MRN: [id number] Visit Date: 10/07/2017              Requested by: Levin Erp, MD [place], [place] [address] PCP: Levin Erp, MD  Chief Complaint  Patient presents with  . Right Foot - Follow-up      HPI: Patient is an 84 year old woman who presents approximately 3 months status post fracture of the proximal phalanx right great toe.  Patient states that it is not as painful as it was she states she does have discomfort when the shoe presses on her toe.  She is currently wearing regular socks.  Assessment & Plan: Visit Diagnoses:  1. Great toe pain, right   2. Closed nondisplaced fracture of proximal phalanx of right great toe with routine healing, subsequent encounter     Plan: Discussed compression socks for her venous insufficiency but it is doubtful that she could get these on.  She has a good stiff soled athletic shoe and this would be perfect for her to continue wearing.  Discussed that she could drive when she could safely move her foot from the gas pedal to the brake to stop her car.  Follow-Up Instructions: Return if symptoms worsen or fail to improve.   Ortho Exam  Patient is alert, oriented, no adenopathy, well-dressed, normal affect, normal respiratory effort. Examination patient has an antalgic gait she has venous stasis swelling with pitting edema in the right leg there are no ulcers no cellulitis no drainage.  The great toe has no pain with passive range of motion the fracture site has no tenderness to palpation.  Imaging: Xr Foot Complete Right  Result Date: 10/07/2017 3 view radiographs of the right foot shows a well-healed proximal phalanx fracture of the right great toe patient does have degenerative changes of the MTP joint with joint space narrowing.  No images are attached to the encounter.  Labs: Lab Results  Component Value  Date   HGBA1C  06/25/2010    5.5 (NOTE)                                                                       According to the ADA Clinical Practice Recommendations for 2011, when HbA1c is used as a screening test:   >=6.5%   Diagnostic of Diabetes Mellitus           (if abnormal result  is confirmed)  5.7-6.4%   Increased risk of developing Diabetes Mellitus  References:Diagnosis and Classification of Diabetes Mellitus,Diabetes DSKA,7681,15(BWIOM 1):S62-S69 and Standards of Medical Care in         Diabetes - 2011,Diabetes Care,2011,34  (Suppl 1):S11-S61.   HGBA1C  11/29/2009    5.6 (NOTE)  According to the ADA Clinical Practice Recommendations for 2011, when HbA1c is used as a screening test:   >=6.5%   Diagnostic of Diabetes Mellitus           (if abnormal result  is confirmed)  5.7-6.4%   Increased risk of developing Diabetes Mellitus  References:Diagnosis and Classification of Diabetes Mellitus,Diabetes PXTG,6269,48(NIOEV 1):S62-S69 and Standards of Medical Care in         Diabetes - 2011,Diabetes OJJK,0938,18  (Suppl 1):S11-S61.   REPTSTATUS 12/02/2009 FINAL 11/29/2009   CULT ESCHERICHIA COLI 11/29/2009   LABORGA ESCHERICHIA COLI 11/29/2009    @LABSALLVALUES (HGBA1)@  There is no height or weight on file to calculate BMI.  Orders:  Orders Placed This Encounter  Procedures  . XR Foot Complete Right   No orders of the defined types were placed in this encounter.    Procedures: No procedures performed  Clinical Data: No additional findings.  ROS:  All other systems negative, except as noted in the HPI. Review of Systems  Objective: Vital Signs: There were no vitals taken for this visit.  Specialty Comments:  No specialty comments available.  PMFS History: Patient Active Problem List   Diagnosis Date Noted  . Ganglion cyst of dorsum of right wrist 07/18/2017  . Closed nondisplaced fracture of  proximal phalanx of right great toe 07/18/2017  . Ankle fracture 10/13/2015  . Closed left ankle fracture 10/12/2015  . GERD (gastroesophageal reflux disease) 10/12/2015  . Anxiety 10/12/2015  . Fall   . Hip fracture ([place]) 07/28/2014  . Protein-calorie malnutrition, severe ([place]) 07/28/2014  . Dizziness 04/24/2012  . Restless leg 03/15/2011  . Atrial flutter ([place]) 08/17/2010  . Essential hypertension 08/16/2010  . Coronary atherosclerosis 08/16/2010  . PVD 08/16/2010   Past Medical History:  Diagnosis Date  . Ankle fracture, left 09/2015  . Arthritis   . Atrial flutter ([place])   . Cancer ([place])    OVARIAN    1967  . Coronary artery disease   . Dysrhythmia    ATRIAL FLUTTER  . GERD (gastroesophageal reflux disease)   . Hypertension   . Peripheral arterial disease ([place])   . PVD (peripheral vascular disease) ([facility])     Family History  Problem Relation Age of Onset  . COPD Father   . Hypertension Father     Past Surgical History:  Procedure Laterality Date  . ABDOMINAL HYSTERECTOMY    . ATRIAL FLUTTER ABLATION N/A 05/22/2013   Procedure: ATRIAL FLUTTER ABLATION;  Surgeon: Evans Lance, MD;  Location: [facility] CATH LAB;  Service: Cardiovascular;  Laterality: N/A;  . BPG    . CAROTID STENT  11/30/09   LAD S/P Right CA DES  . FRACTURE SURGERY Left Feb. 2, 2016   Left Hip Fx   . GLIAYTE CATHERTER INSERTION  06/27/10  . INTRAMEDULLARY (IM) NAIL INTERTROCHANTERIC Left 07/28/2014   Procedure: Left Affixus Nail;  Surgeon: Marybelle Killings, MD;  Location: [place];  Service: Orthopedics;  Laterality: Left;  . PR VEIN BYPASS GRAFT,AORTO-FEM-POP     Right    Social History   Occupational History  . Not on file  Tobacco Use  . Smoking status: Never Smoker  . Smokeless tobacco: Never Used  Substance and Sexual Activity  . Alcohol use: No  . Drug use: No  . Sexual activity: Not on file

## 2017-11-20 ENCOUNTER — Ambulatory Visit (INDEPENDENT_AMBULATORY_CARE_PROVIDER_SITE_OTHER): Payer: Medicare Other | Admitting: Family

## 2017-11-25 ENCOUNTER — Telehealth: Payer: Self-pay | Admitting: *Deleted

## 2017-11-25 NOTE — Telephone Encounter (Signed)
Patient called requesting appointment with Dr. Donnetta Hutching. She is scheduled to see him with ABI"s in 10/19, but is having problems.She continues to have pain and problems walking s/p broken toe, but was released by Dr. Sharol Given. C/o right hip pain also.  Right leg swelling and weeping x 2-3 weeks. I asked her to contact her PCP today and get in to see him ASAP, While we looked at getting her in to this office for ABI's and appt. With Dr. Donnetta Hutching.

## 2017-12-03 ENCOUNTER — Other Ambulatory Visit: Payer: Self-pay | Admitting: Internal Medicine

## 2017-12-03 DIAGNOSIS — R609 Edema, unspecified: Secondary | ICD-10-CM

## 2017-12-03 DIAGNOSIS — M79604 Pain in right leg: Secondary | ICD-10-CM

## 2017-12-04 ENCOUNTER — Ambulatory Visit
Admission: RE | Admit: 2017-12-04 | Discharge: 2017-12-04 | Disposition: A | Discharge status: Home / Self Care | Payer: Medicare Other | Source: Ambulatory Visit | Attending: Internal Medicine | Admitting: Internal Medicine

## 2017-12-04 DIAGNOSIS — R609 Edema, unspecified: Secondary | ICD-10-CM

## 2017-12-04 DIAGNOSIS — M79604 Pain in right leg: Secondary | ICD-10-CM

## 2017-12-27 ENCOUNTER — Other Ambulatory Visit: Payer: Self-pay

## 2017-12-27 DIAGNOSIS — M79606 Pain in leg, unspecified: Secondary | ICD-10-CM

## 2017-12-27 DIAGNOSIS — I779 Disorder of arteries and arterioles, unspecified: Secondary | ICD-10-CM

## 2018-01-14 ENCOUNTER — Ambulatory Visit (HOSPITAL_COMMUNITY)
Admission: RE | Admit: 2018-01-14 | Discharge: 2018-01-14 | Disposition: A | Discharge status: Home / Self Care | Payer: Medicare Other | Source: Ambulatory Visit | Attending: Vascular Surgery | Admitting: Vascular Surgery

## 2018-01-14 ENCOUNTER — Other Ambulatory Visit: Payer: Self-pay

## 2018-01-14 ENCOUNTER — Encounter: Payer: Self-pay | Admitting: Vascular Surgery

## 2018-01-14 ENCOUNTER — Ambulatory Visit: Payer: Medicare Other | Admitting: Vascular Surgery

## 2018-01-14 VITALS — BP 175/90 | HR 53 | Resp 18 | Ht 62.0 in | Wt 84.0 lb

## 2018-01-14 DIAGNOSIS — M79606 Pain in leg, unspecified: Secondary | ICD-10-CM

## 2018-01-14 DIAGNOSIS — R9389 Abnormal findings on diagnostic imaging of other specified body structures: Secondary | ICD-10-CM | POA: Diagnosis not present

## 2018-01-14 DIAGNOSIS — R0989 Other specified symptoms and signs involving the circulatory and respiratory systems: Secondary | ICD-10-CM | POA: Insufficient documentation

## 2018-01-14 DIAGNOSIS — I779 Disorder of arteries and arterioles, unspecified: Secondary | ICD-10-CM | POA: Diagnosis not present

## 2018-01-14 DIAGNOSIS — I10 Essential (primary) hypertension: Secondary | ICD-10-CM | POA: Diagnosis not present

## 2018-01-14 NOTE — Progress Notes (Signed)
[facility]ist of [facility]  Patient name: Michelle Gaines MRN: [id number] DOB: 27-Aug-1928 Sex: female  REASON FOR VISIT: Evaluation pain and swelling in right leg  HPI: Michelle Gaines is a 84 y.o. female here today for evaluation.  She is with her husband.  She is known to our practice from a external iliac to profunda bypass by Dr. Kellie Simmering in 2009.  She reports that since January she was having swelling in her entire right leg.  Apparently she fell and had fracture of her right great toe which was treated nonoperatively.  She reports swelling that was quite persistent.  This has responded recently to diuretic treatment.  Her main complaint is of right knee discomfort currently.  She exercises her knee and reports that she can have some improvement but this is preventing her from walking.  She does not have any claudication type symptoms  Past Medical History:  Diagnosis Date  . Ankle fracture, left 09/2015  . Arthritis   . Atrial flutter ([place])   . Cancer ([place])    OVARIAN    1967  . Coronary artery disease   . Dysrhythmia    ATRIAL FLUTTER  . GERD (gastroesophageal reflux disease)   . Hypertension   . Peripheral arterial disease ([place])   . PVD (peripheral vascular disease) ([facility])     Family History  Problem Relation Age of Onset  . COPD Father   . Hypertension Father     SOCIAL HISTORY: Social History   Tobacco Use  . Smoking status: Never Smoker  . Smokeless tobacco: Never Used  Substance Use Topics  . Alcohol use: No    Allergies  Allergen Reactions  . Fexofenadine Shortness Of Breath  . Cephalosporins Rash  . Diltiazem Other (See Comments)    Dizziness   . Penicillins Nausea And Vomiting, Swelling and Rash  . Amlodipine Other (See Comments)  . Anaprox [Naproxen Sodium] Other (See Comments)  . Benzonatate Other (See Comments)  . Cephalexin Other (See Comments)  . Clindamycin/Lincomycin Other (See Comments)    . Clorazepate Other (See Comments)  . Codeine Other (See Comments)  . Doxycycline Other (See Comments)  . Hydralazine Other (See Comments)  . Labetalol Other (See Comments)  . Lisinopril Other (See Comments)  . Phenylephrine Other (See Comments)  . Phenytoin Other (See Comments)  . Zolpidem Other (See Comments)    Current Outpatient Medications  Medication Sig Dispense Refill  . BETA CAROTENE PO Take 1 tablet by mouth daily.    . carvedilol (COREG) 6.25 MG tablet Take 1 tablet (6.25 mg total) by mouth 2 (two) times daily with a meal. Okay to take one additional tablet daily as needed SBP > 150. 180 tablet 3  . cyanocobalamin 100 MCG tablet Take 100 mcg by mouth daily.    . Flaxseed, Linseed, (FLAX SEED OIL PO) Take 1 tablet by mouth daily.    . Homeopathic Products (SLEEP MEDICINE) TABS Take 1 tablet by mouth at bedtime.    Marland Kitchen LORazepam (ATIVAN) 1 MG tablet     . Lutein 20 MG TABS Take 20 mg by mouth daily.    . magnesium gluconate (MAGONATE) 500 MG tablet Take 500 mg by mouth 2 (two) times daily.      . multivitamin (THERAGRAN) per tablet Take 1 tablet by mouth daily.      . nitroGLYCERIN (NITROSTAT) 0.4 MG SL tablet Place 1 tablet (0.4 mg total) under the tongue every 5 (five) minutes as needed  for chest pain. 25 tablet 12  . Potassium 99 MG TABS Take 1 tablet by mouth daily.      Marland Kitchen Ubiquinol 50 MG CAPS Take 50 mg by mouth 2 (two) times daily.    Marland Kitchen VITAMIN D, CHOLECALCIFEROL, PO Take 1,000 mg by mouth daily.     . vitamin E 400 UNIT capsule Take 400 Units by mouth daily.     No current facility-administered medications for this visit.     REVIEW OF SYSTEMS:  [X]  denotes positive finding, [ ]  denotes negative finding Cardiac  Comments:  Chest pain or chest pressure:    Shortness of breath upon exertion:    Short of breath when lying flat:    Irregular heart rhythm:        Vascular    Pain in calf, thigh, or hip brought on by ambulation: x   Pain in feet at night that wakes  you up from your sleep:     Blood clot in your veins:    Leg swelling:  x         PHYSICAL EXAM: Vitals:   01/14/18 1041 01/14/18 1049  BP: (!) 179/92 (!) 175/90  Pulse: (!) 53   Resp: 18   SpO2: 100%   Weight: 84 lb (38.1 kg)   Height: 5\' 2"  (1.575 m)     GENERAL: The patient is a well-nourished female, in no acute distress. The vital signs are documented above. CARDIOVASCULAR: 2+ graft pulse in her right groin and 2+ femoral pulse on the left groin.  Absent distal pulses area no significant swelling in her right or left leg currently PULMONARY: There is good air exchange  MUSCULOSKELETAL: There are no major deformities or cyanosis. NEUROLOGIC: No focal weakness or paresthesias are detected. SKIN: There are no ulcers or rashes noted. PSYCHIATRIC: The patient has a normal affect.  DATA:  I reviewed her duplex from [facility] imaging from 12/13/2017 showing no evidence of bilateral DVT  He underwent arterial studies in our office today showing an ankle arm index of 0.58 on the right and 0.54 on the left  MEDICAL ISSUES: I discussed these findings with the patient.  I do not see any evidence of arterial or venous pathology to account for her discomfort.  This appears mainly to be orthopedic in her right knee.  She has had surgery with Dr. Rodell Perna in the past and requested referral to him for evaluation of this.  We will see her again in 1 year with repeat noninvasive studies    Rosetta Posner, MD [facility] Tel [phone] Pager [phone]

## 2018-01-23 ENCOUNTER — Ambulatory Visit (INDEPENDENT_AMBULATORY_CARE_PROVIDER_SITE_OTHER): Payer: Medicare Other | Admitting: Orthopedic Surgery

## 2018-03-12 ENCOUNTER — Ambulatory Visit (HOSPITAL_COMMUNITY)
Admission: EM | Admit: 2018-03-12 | Discharge: 2018-03-12 | Disposition: A | Discharge status: Home / Self Care | Payer: Medicare Other | Source: Home / Self Care

## 2018-03-12 ENCOUNTER — Encounter (HOSPITAL_COMMUNITY): Payer: Self-pay

## 2018-03-12 ENCOUNTER — Emergency Department (HOSPITAL_COMMUNITY)
Admission: EM | Admit: 2018-03-12 | Discharge: 2018-03-12 | Disposition: A | Discharge status: Home / Self Care | Payer: Medicare Other | Attending: Emergency Medicine | Admitting: Emergency Medicine

## 2018-03-12 ENCOUNTER — Emergency Department (HOSPITAL_COMMUNITY): Payer: Medicare Other

## 2018-03-12 DIAGNOSIS — W010XXA Fall on same level from slipping, tripping and stumbling without subsequent striking against object, initial encounter: Secondary | ICD-10-CM | POA: Insufficient documentation

## 2018-03-12 DIAGNOSIS — R55 Syncope and collapse: Secondary | ICD-10-CM | POA: Insufficient documentation

## 2018-03-12 DIAGNOSIS — S0101XA Laceration without foreign body of scalp, initial encounter: Secondary | ICD-10-CM | POA: Diagnosis not present

## 2018-03-12 DIAGNOSIS — E876 Hypokalemia: Secondary | ICD-10-CM | POA: Diagnosis not present

## 2018-03-12 DIAGNOSIS — Z8543 Personal history of malignant neoplasm of ovary: Secondary | ICD-10-CM | POA: Insufficient documentation

## 2018-03-12 DIAGNOSIS — Y93G1 Activity, food preparation and clean up: Secondary | ICD-10-CM | POA: Insufficient documentation

## 2018-03-12 DIAGNOSIS — Y999 Unspecified external cause status: Secondary | ICD-10-CM | POA: Insufficient documentation

## 2018-03-12 DIAGNOSIS — I10 Essential (primary) hypertension: Secondary | ICD-10-CM | POA: Diagnosis not present

## 2018-03-12 DIAGNOSIS — S0990XA Unspecified injury of head, initial encounter: Secondary | ICD-10-CM | POA: Diagnosis present

## 2018-03-12 DIAGNOSIS — Z79899 Other long term (current) drug therapy: Secondary | ICD-10-CM | POA: Diagnosis not present

## 2018-03-12 DIAGNOSIS — Y92000 Kitchen of unspecified non-institutional (private) residence as  the place of occurrence of the external cause: Secondary | ICD-10-CM | POA: Insufficient documentation

## 2018-03-12 DIAGNOSIS — I2510 Atherosclerotic heart disease of native coronary artery without angina pectoris: Secondary | ICD-10-CM | POA: Insufficient documentation

## 2018-03-12 LAB — BASIC METABOLIC PANEL
Anion gap: 9 (ref 5–15)
BUN: 25 mg/dL — ABNORMAL HIGH (ref 8–23)
CALCIUM: 8.9 mg/dL (ref 8.9–10.3)
CO2: 28 mmol/L (ref 22–32)
Chloride: 104 mmol/L (ref 98–111)
Creatinine, Ser: 1.07 mg/dL — ABNORMAL HIGH (ref 0.44–1.00)
GFR, EST AFRICAN AMERICAN: 53 mL/min — AB (ref >60)
GFR, EST NON AFRICAN AMERICAN: 45 mL/min — AB (ref >60)
GLUCOSE: 104 mg/dL — AB (ref 70–99)
Potassium: 2.9 mmol/L — ABNORMAL LOW (ref 3.5–5.1)
Sodium: 141 mmol/L (ref 135–145)

## 2018-03-12 LAB — CBC
HCT: 34.8 % — ABNORMAL LOW (ref 36.0–46.0)
Hemoglobin: 11.1 g/dL — ABNORMAL LOW (ref 12.0–15.0)
MCH: 32.1 pg (ref 26.0–34.0)
MCHC: 31.9 g/dL (ref 30.0–36.0)
MCV: 100.6 fL — ABNORMAL HIGH (ref 78.0–100.0)
Platelets: 223 10*3/uL (ref 150–400)
RBC: 3.46 MIL/uL — AB (ref 3.87–5.11)
RDW: 13.9 % (ref 11.5–15.5)
WBC: 8.1 10*3/uL (ref 4.0–10.5)

## 2018-03-12 LAB — TROPONIN I: TROPONIN I: 0.03 ng/mL — AB (ref <0.03)

## 2018-03-12 NOTE — ED Triage Notes (Signed)
Pt presents for evaluation of head laceration to posterior R side after syncopal episode today. Pt reports she felt lightheaded initially and hit head on floor. No blood thinners.

## 2018-03-12 NOTE — ED Provider Notes (Signed)
[facility] EMERGENCY DEPARTMENT Provider Note   CSN: [id number] Arrival date & time: 03/12/18  1443     History   Chief Complaint Chief Complaint  Patient presents with  . Loss of Consciousness  . Head Injury    HPI Michelle Gaines is a 84 y.o. female.  HPI Patient presents after a syncopal/near syncopal episode.  She was fixing food and became lightheaded.  Fell down striking the back of her head.  Patient states she remembers the fall but family member states she passed out and hit her head.  Not on anticoagulation.  History of atrial fibrillation.  No extremity pain.  No chest pain. Past Medical History:  Diagnosis Date  . Ankle fracture, left 09/2015  . Arthritis   . Atrial flutter ([place])   . Cancer ([place])    OVARIAN    1967  . Coronary artery disease   . Dysrhythmia    ATRIAL FLUTTER  . GERD (gastroesophageal reflux disease)   . Hypertension   . Peripheral arterial disease ([place])   . PVD (peripheral vascular disease) [facility])     Patient Active Problem List   Diagnosis Date Noted  . Ganglion cyst of dorsum of right wrist 07/18/2017  . Closed nondisplaced fracture of proximal phalanx of right great toe 07/18/2017  . Ankle fracture 10/13/2015  . Closed left ankle fracture 10/12/2015  . GERD (gastroesophageal reflux disease) 10/12/2015  . Anxiety 10/12/2015  . Fall   . Hip fracture ([place]) 07/28/2014  . Protein-calorie malnutrition, severe ([place]) 07/28/2014  . Dizziness 04/24/2012  . Restless leg 03/15/2011  . Atrial flutter ([place]) 08/17/2010  . Essential hypertension 08/16/2010  . Coronary atherosclerosis 08/16/2010  . PVD 08/16/2010    Past Surgical History:  Procedure Laterality Date  . ABDOMINAL HYSTERECTOMY    . ATRIAL FLUTTER ABLATION N/A 05/22/2013   Procedure: ATRIAL FLUTTER ABLATION;  Surgeon: Evans Lance, MD;  Location: [facility] CATH LAB;  Service: Cardiovascular;  Laterality: N/A;  . BPG    . CAROTID STENT  11/30/09   LAD S/P Right CA  DES  . FRACTURE SURGERY Left Feb. 2, 2016   Left Hip Fx   . GLIAYTE CATHERTER INSERTION  06/27/10  . INTRAMEDULLARY (IM) NAIL INTERTROCHANTERIC Left 07/28/2014   Procedure: Left Affixus Nail;  Surgeon: Marybelle Killings, MD;  Location: [facility];  Service: Orthopedics;  Laterality: Left;  . PR VEIN BYPASS GRAFT,AORTO-FEM-POP     Right      OB History   None      Home Medications    Prior to Admission medications   Medication Sig Start Date End Date Taking? Authorizing Provider  furosemide (LASIX) 20 MG tablet Take 20 mg by mouth daily. 01/21/18  Yes [provider]  losartan (COZAAR) 50 MG tablet Take 50 mg by mouth daily. 01/21/18  Yes [provider]  Lutein 20 MG TABS Take 20 mg by mouth daily.   Yes [provider]  magnesium gluconate (MAGONATE) 500 MG tablet Take 500 mg by mouth 2 (two) times daily.     Yes [provider]  multivitamin [facility]) per tablet Take 1 tablet by mouth daily.     Yes [provider]  nitroGLYCERIN (NITROSTAT) 0.4 MG SL tablet Place 1 tablet (0.4 mg total) under the tongue every 5 (five) minutes as needed for chest pain. 05/23/13  Yes Barrett, Evelene Croon, PA-C  Potassium 99 MG TABS Take 1 tablet by mouth daily.     Yes  [provider]  VITAMIN D, CHOLECALCIFEROL, PO Take 1,000 mg by mouth daily.    Yes [provider]  carvedilol (COREG) 6.25 MG tablet Take 1 tablet (6.25 mg total) by mouth 2 (two) times daily with a meal. Okay to take one additional tablet daily as needed SBP > 150. Patient not taking: Reported on 03/12/2018 09/27/15   Evans Lance, MD    Family History Family History  Problem Relation Age of Onset  . COPD Father   . Hypertension Father     Social History Social History   Tobacco Use  . Smoking status: Never Smoker  . Smokeless tobacco: Never Used  Substance Use Topics  . Alcohol use: No  . Drug use: No     Allergies   Fexofenadine; Cephalosporins; Diltiazem;  Penicillins; Amlodipine; Anaprox [naproxen sodium]; Benzonatate; Cephalexin; Clindamycin/lincomycin; Clorazepate; Codeine; Doxycycline; Hydralazine; Labetalol; Lisinopril; Phenylephrine; Phenytoin; and Zolpidem   Review of Systems Review of Systems  Constitutional: Negative for activity change.  HENT: Negative for congestion.   Respiratory: Negative for chest tightness and shortness of breath.   Cardiovascular: Negative for chest pain.  Gastrointestinal: Negative for abdominal pain.  Genitourinary: Negative for flank pain.  Musculoskeletal: Negative for back pain.  Neurological: Positive for light-headedness.  Hematological: Negative for adenopathy.  Psychiatric/Behavioral: Negative for confusion.     Physical Exam Updated Vital Signs BP (!) 134/100   Pulse 96   Temp 97.9 F (36.6 C) (Oral)   Resp 16   SpO2 100%   Physical Exam  Constitutional: She appears well-developed.  HENT:  1 cm laceration occipital area.  Eyes: Pupils are equal, round, and reactive to light.  Neck: Neck supple.  Cardiovascular:  Irregular rhythm.  Pulmonary/Chest: Effort normal.  Abdominal: There is no tenderness.  Musculoskeletal: She exhibits no edema.  Neurological: She is alert.  Skin: Skin is warm. Capillary refill takes less than 2 seconds.     ED Treatments / Results  Labs (all labs ordered are listed, but only abnormal results are displayed) Labs Reviewed  BASIC METABOLIC PANEL - Abnormal; Notable for the following components:      Result Value   Potassium 2.9 (*)    Glucose, Bld 104 (*)    BUN 25 (*)    Creatinine, Ser 1.07 (*)    GFR calc non Af Amer 45 (*)    GFR calc Af Amer 53 (*)    All other components within normal limits  CBC - Abnormal; Notable for the following components:   RBC 3.46 (*)    Hemoglobin 11.1 (*)    HCT 34.8 (*)    MCV 100.6 (*)    All other components within normal limits  TROPONIN I - Abnormal; Notable for the following components:   Troponin I  0.03 (*)    All other components within normal limits    EKG EKG Interpretation  Date/Time:  Wednesday March 12 2018 15:26:07 EDT Ventricular Rate:  83 PR Interval:    QRS Duration: 134 QT Interval:  442 QTC Calculation: 519 R Axis:   90 Text Interpretation:  Atrial fibrillation  vs sinus with PACs  Rightward axis Non-specific intra-ventricular conduction block T wave abnormality, consider inferolateral ischemia Abnormal ECG Reconfirmed by Davonna Belling [phone]) on 03/12/2018 5:08:48 PM   Radiology Ct Head Wo Contrast  Result Date: 03/12/2018 CLINICAL DATA:  Fall with head laceration EXAM: CT HEAD WITHOUT CONTRAST TECHNIQUE: Contiguous axial images were obtained from the base of the skull through the vertex without  intravenous contrast. COMPARISON:  CT brain 05/20/2013 FINDINGS: Brain: No acute territorial infarction, hemorrhage or intracranial mass is visualized. Mild atrophy. Patchy small vessel ischemic changes of the white matter. Stable ventricle size Vascular: No hyperdense vessels.  Carotid vascular calcification Skull: No fracture Sinuses/Orbits: No acute finding. Other: Right posterior scalp laceration IMPRESSION: 1. No CT evidence for acute intracranial abnormality. 2. Atrophy and mild small vessel ischemic changes of the white matter Electronically Signed   By: Donavan Foil M.D.   On: 03/12/2018 18:27    Procedures Procedures (including critical care time)  Medications Ordered in ED Medications - No data to display   Initial Impression / Assessment and Plan / ED Course  I have reviewed the triage vital signs and the nursing notes.  Pertinent labs & imaging results that were available during my care of the patient were reviewed by me and considered in my medical decision making (see chart for details).     Patient with near syncopal or possibly syncopal episode.  Laceration to head.  Closed with staples.  Patient is very eager to leave.  Mild hypo-kalemia but  also troponin just slightly above normal but at her apparent baseline.  Had no chest pain with it.  She appears to be in a sinus rhythm with frequent atrial beats.  Appears to been in this rhythm in the past.  I do not think it is in A. fib.  Patient really does not want to come in the hospital I think she is likely stable for an outpatient work-up.  Patient is aware there is some risk to this however.  Will discharge home.  Final Clinical Impressions(s) / ED Diagnoses   Final diagnoses:  Near syncope  Scalp laceration, initial encounter  Hypokalemia    ED Discharge Orders    None       Davonna Belling, MD 03/12/18 2005

## 2018-03-12 NOTE — ED Notes (Signed)
Pt here with son in law, pt is AAOX4, son in law states patient passed out and hit head on the floor in the kitchen. Pt has laceration on back of head, bleeding. Pt sent to the ER with son in law.

## 2018-03-12 NOTE — Discharge Instructions (Signed)
Take 2 potassium pills a day for the next 5 days.  Have the staples taken out in 7 to 10 days.  Follow-up with your primary care doctor for your lightheadedness.

## 2018-03-12 NOTE — ED Notes (Signed)
The pt fell this am while fixing food  She said her head felt strange and she fell and struck the back of her head  Small cut to the back of her head  No bleeding at present.  Laceration cleaned with soap and water.  Alert and orineted skin warm and dry.  Unsteady gait

## 2018-03-12 NOTE — ED Notes (Signed)
Waiting on x ray results

## 2018-10-03 ENCOUNTER — Ambulatory Visit: Payer: Medicare Other | Admitting: Internal Medicine

## 2018-10-22 ENCOUNTER — Other Ambulatory Visit: Payer: Self-pay

## 2018-10-22 ENCOUNTER — Emergency Department (HOSPITAL_COMMUNITY): Payer: Medicare Other

## 2018-10-22 ENCOUNTER — Inpatient Hospital Stay (HOSPITAL_COMMUNITY)
Admission: EM | Admit: 2018-10-22 | Discharge: 2018-10-28 | DRG: 377 | Disposition: A | Discharge status: Skilled Nursing Facility | Payer: Medicare Other | Attending: Family Medicine | Admitting: Family Medicine

## 2018-10-22 ENCOUNTER — Encounter (HOSPITAL_COMMUNITY): Payer: Self-pay

## 2018-10-22 DIAGNOSIS — K921 Melena: Secondary | ICD-10-CM | POA: Diagnosis present

## 2018-10-22 DIAGNOSIS — L89152 Pressure ulcer of sacral region, stage 2: Secondary | ICD-10-CM | POA: Diagnosis present

## 2018-10-22 DIAGNOSIS — F05 Delirium due to known physiological condition: Secondary | ICD-10-CM | POA: Diagnosis not present

## 2018-10-22 DIAGNOSIS — D72829 Elevated white blood cell count, unspecified: Secondary | ICD-10-CM | POA: Diagnosis not present

## 2018-10-22 DIAGNOSIS — D539 Nutritional anemia, unspecified: Secondary | ICD-10-CM

## 2018-10-22 DIAGNOSIS — T50905A Adverse effect of unspecified drugs, medicaments and biological substances, initial encounter: Secondary | ICD-10-CM | POA: Diagnosis not present

## 2018-10-22 DIAGNOSIS — K644 Residual hemorrhoidal skin tags: Secondary | ICD-10-CM | POA: Diagnosis present

## 2018-10-22 DIAGNOSIS — Z9181 History of falling: Secondary | ICD-10-CM

## 2018-10-22 DIAGNOSIS — M79605 Pain in left leg: Secondary | ICD-10-CM | POA: Diagnosis present

## 2018-10-22 DIAGNOSIS — Z880 Allergy status to penicillin: Secondary | ICD-10-CM

## 2018-10-22 DIAGNOSIS — Z95820 Peripheral vascular angioplasty status with implants and grafts: Secondary | ICD-10-CM

## 2018-10-22 DIAGNOSIS — Z8543 Personal history of malignant neoplasm of ovary: Secondary | ICD-10-CM

## 2018-10-22 DIAGNOSIS — Z888 Allergy status to other drugs, medicaments and biological substances status: Secondary | ICD-10-CM

## 2018-10-22 DIAGNOSIS — I2510 Atherosclerotic heart disease of native coronary artery without angina pectoris: Secondary | ICD-10-CM | POA: Diagnosis present

## 2018-10-22 DIAGNOSIS — E876 Hypokalemia: Secondary | ICD-10-CM | POA: Diagnosis present

## 2018-10-22 DIAGNOSIS — G8929 Other chronic pain: Secondary | ICD-10-CM | POA: Diagnosis present

## 2018-10-22 DIAGNOSIS — D696 Thrombocytopenia, unspecified: Secondary | ICD-10-CM | POA: Diagnosis not present

## 2018-10-22 DIAGNOSIS — M1990 Unspecified osteoarthritis, unspecified site: Secondary | ICD-10-CM | POA: Diagnosis present

## 2018-10-22 DIAGNOSIS — K922 Gastrointestinal hemorrhage, unspecified: Secondary | ICD-10-CM | POA: Diagnosis not present

## 2018-10-22 DIAGNOSIS — Z881 Allergy status to other antibiotic agents status: Secondary | ICD-10-CM

## 2018-10-22 DIAGNOSIS — Z66 Do not resuscitate: Secondary | ICD-10-CM | POA: Diagnosis present

## 2018-10-22 DIAGNOSIS — E43 Unspecified severe protein-calorie malnutrition: Secondary | ICD-10-CM | POA: Diagnosis present

## 2018-10-22 DIAGNOSIS — D62 Acute posthemorrhagic anemia: Secondary | ICD-10-CM | POA: Diagnosis present

## 2018-10-22 DIAGNOSIS — L84 Corns and callosities: Secondary | ICD-10-CM | POA: Diagnosis present

## 2018-10-22 DIAGNOSIS — Z8679 Personal history of other diseases of the circulatory system: Secondary | ICD-10-CM

## 2018-10-22 DIAGNOSIS — F0390 Unspecified dementia without behavioral disturbance: Secondary | ICD-10-CM | POA: Diagnosis present

## 2018-10-22 DIAGNOSIS — K5641 Fecal impaction: Secondary | ICD-10-CM

## 2018-10-22 DIAGNOSIS — I10 Essential (primary) hypertension: Secondary | ICD-10-CM | POA: Diagnosis present

## 2018-10-22 DIAGNOSIS — R330 Drug induced retention of urine: Secondary | ICD-10-CM | POA: Diagnosis not present

## 2018-10-22 DIAGNOSIS — L8990 Pressure ulcer of unspecified site, unspecified stage: Secondary | ICD-10-CM

## 2018-10-22 DIAGNOSIS — R9431 Abnormal electrocardiogram [ECG] [EKG]: Secondary | ICD-10-CM | POA: Diagnosis present

## 2018-10-22 DIAGNOSIS — R64 Cachexia: Secondary | ICD-10-CM | POA: Diagnosis present

## 2018-10-22 DIAGNOSIS — Z8781 Personal history of (healed) traumatic fracture: Secondary | ICD-10-CM

## 2018-10-22 DIAGNOSIS — Z825 Family history of asthma and other chronic lower respiratory diseases: Secondary | ICD-10-CM

## 2018-10-22 DIAGNOSIS — K284 Chronic or unspecified gastrojejunal ulcer with hemorrhage: Secondary | ICD-10-CM | POA: Diagnosis not present

## 2018-10-22 DIAGNOSIS — Z1159 Encounter for screening for other viral diseases: Secondary | ICD-10-CM

## 2018-10-22 DIAGNOSIS — Z8249 Family history of ischemic heart disease and other diseases of the circulatory system: Secondary | ICD-10-CM

## 2018-10-22 DIAGNOSIS — I739 Peripheral vascular disease, unspecified: Secondary | ICD-10-CM | POA: Diagnosis present

## 2018-10-22 DIAGNOSIS — R600 Localized edema: Secondary | ICD-10-CM | POA: Diagnosis present

## 2018-10-22 DIAGNOSIS — Z79899 Other long term (current) drug therapy: Secondary | ICD-10-CM

## 2018-10-22 DIAGNOSIS — Y92239 Unspecified place in hospital as the place of occurrence of the external cause: Secondary | ICD-10-CM | POA: Diagnosis present

## 2018-10-22 DIAGNOSIS — Z885 Allergy status to narcotic agent status: Secondary | ICD-10-CM

## 2018-10-22 DIAGNOSIS — L8993 Pressure ulcer of unspecified site, stage 3: Secondary | ICD-10-CM

## 2018-10-22 DIAGNOSIS — Z681 Body mass index (BMI) 19 or less, adult: Secondary | ICD-10-CM

## 2018-10-22 DIAGNOSIS — K625 Hemorrhage of anus and rectum: Secondary | ICD-10-CM

## 2018-10-22 DIAGNOSIS — Z90710 Acquired absence of both cervix and uterus: Secondary | ICD-10-CM

## 2018-10-22 DIAGNOSIS — R54 Age-related physical debility: Secondary | ICD-10-CM | POA: Diagnosis present

## 2018-10-22 LAB — COMPREHENSIVE METABOLIC PANEL
ALT: 23 U/L (ref 0–44)
AST: 37 U/L (ref 15–41)
Albumin: 3.4 g/dL — ABNORMAL LOW (ref 3.5–5.0)
Alkaline Phosphatase: 118 U/L (ref 38–126)
Anion gap: 12 (ref 5–15)
BUN: 33 mg/dL — ABNORMAL HIGH (ref 8–23)
CO2: 27 mmol/L (ref 22–32)
Calcium: 9.1 mg/dL (ref 8.9–10.3)
Chloride: 103 mmol/L (ref 98–111)
Creatinine, Ser: 1.16 mg/dL — ABNORMAL HIGH (ref 0.44–1.00)
GFR calc Af Amer: 49 mL/min — ABNORMAL LOW (ref >60)
GFR calc non Af Amer: 42 mL/min — ABNORMAL LOW (ref >60)
Glucose, Bld: 107 mg/dL — ABNORMAL HIGH (ref 70–99)
Potassium: 3.1 mmol/L — ABNORMAL LOW (ref 3.5–5.1)
Sodium: 142 mmol/L (ref 135–145)
Total Bilirubin: 0.7 mg/dL (ref 0.3–1.2)
Total Protein: 6.3 g/dL — ABNORMAL LOW (ref 6.5–8.1)

## 2018-10-22 LAB — TYPE AND SCREEN
ABO/RH(D): O POS
Antibody Screen: NEGATIVE

## 2018-10-22 LAB — CBC
HCT: 36.4 % (ref 36.0–46.0)
Hemoglobin: 11.6 g/dL — ABNORMAL LOW (ref 12.0–15.0)
MCH: 32.3 pg (ref 26.0–34.0)
MCHC: 31.9 g/dL (ref 30.0–36.0)
MCV: 101.4 fL — ABNORMAL HIGH (ref 80.0–100.0)
Platelets: 153 10*3/uL (ref 150–400)
RBC: 3.59 MIL/uL — ABNORMAL LOW (ref 3.87–5.11)
RDW: 14.2 % (ref 11.5–15.5)
WBC: 8.5 10*3/uL (ref 4.0–10.5)
nRBC: 0 % (ref 0.0–0.2)

## 2018-10-22 LAB — POC OCCULT BLOOD, ED: Fecal Occult Bld: POSITIVE — AB

## 2018-10-22 NOTE — ED Notes (Signed)
Michelle Gaines pts son [phone] please call w/ update

## 2018-10-22 NOTE — ED Triage Notes (Signed)
Pt reports rectal bleeding that started two hours ago, bright red, no hx of GI bleeding, denies abd pain, denies nausea, denies dizziness or weakness

## 2018-10-22 NOTE — H&P (Addendum)
[facility] Admission History and Physical Service Pager: [phone]  Patient name: Michelle Gaines record number: [id number] Date of birth: April 30, 1929 Age: 85 y.o. Gender: female  Primary Care Provider: Levin Erp, MD Consultants: Gastroenterology Code Status: DNR  Chief Complaint: Rectal bleeding  Assessment and Plan: Michelle Gaines is a 85 y.o. female presenting with a 1 day history of GI bleeding likely secondary to internal hemorrhoids or diverticulosis.  Patient is hemodynamically stable with a hemoglobin over 11 patient also has chronic left leg pain with no swelling.Marland Kitchen PMH is significant for atrial flutter, hypertension, PVD, falls  GI bleed- 1 day history of painless bright red blood per rectum not associated with bowel movement.  No N/V/D.  No abdominal pain.  Nontender to palpation.  No history of GI bleed.  Not on any anticoagulation or blood thinners other than baby aspirin.  No dizziness; does have some complaints of mild fatigue for the past 3 days.  Hemoglobin 11.6 on admission, platelets normal. She has a longstanding history of constipation and visible external hemorrhoids.  Vital signs stable: SBP over 170, heart rate 76.  X-ray shows large amount of stool in rectosigmoid colon with no evidence of bowel obstruction.  This is most likely a lower GI bleed probably related to internal hemorrhoid given painless nature of bleed or diverticulosis. - Admit to observation, med telemetry, Dr. Mingo Amber attending - Consult GI in a.m. -N.p.o. at midnight - A.m. CBC, BMP, mag, PT, INR -A.m. EKG - Continuous cardiac monitor - PT/OT eval and treat -Daily weights - Vitals per routine - Monitor I's and O's -Holding aspirin in setting of bleeding  Constipation- patient has significant stool burden seen on x-ray.  Patient complains of constipation at baseline with bowel movements 1-2 times per week.  Patient received enema in the ED.  Has not had a  bowel movement yet. - Colace twice daily - MiraLAX daily  Left leg pain- this is a chronic issue with this patient.  She has had several falls in the past with at least 1 surgery in 2016-2 treat a fractured left hip and a right toe fracture in 2019.Marland Kitchen  Patient ambulates with the help of a walker.  There is no swelling or tenderness to palpation of the legs.  Patient has recently seen cardiovascular surgery as an outpatient for right knee pain that did not show any signs of arterial or venous pathology.  Unlikely that this is a DVT.  - We will continue to monitor. - PT/OT consult - tylenol as needed for pain  Hypertension -patient takes losartan 50 mg daily at home.  Patient systolic blood pressures on admission were 152 and 175.  Creatinine 1.16 which is slightly up from last September when it was 1.07. no evidence of AKI.  Patient also takes 20 milligrams Lasix at home - Continue losartan - Hold Lasix  PVD- patient has a history of peripheral vascular disease with external iliac to profunda bypass in 2009.  In September and her visit to the vascular surgeon office she had a ABI of 0.58 on the right and 0.54 on the left.  Patient takes baby aspirin at home -Hold baby aspirin while patient is having active bleed.  History of atrial flutter s/p ablation- patient last saw her cardiologist 1 year ago who asked her to follow-up in 1 year.  She had an atrial flutter ablation in 2014.  Patient has been refusing systemic anticoagulation per cardiologist note.  Patient not on beta-blockers  due to intolerance.  On exam her heart rate was irregular but unable to determine if due to A. fib or PVCs. -A.m. EKG -Continuous cardiac monitor  Hypokalemia-patient potassium was 3.1 on admission.  She takes Lasix at home - Replete potassium - A.m. BMP and mag  Protein-calorie malnutrition- patient extremely frail -consult nutrition  FEN/GI: N.p.o. Prophylaxis: SCD  Disposition: Med telemetry  History of  Present Illness:  Michelle Gaines is a 85 y.o. female presenting with a 1 day history of bright red blood per rectum.  Patient stated that this afternoon at approximately 5 PM after dinner she was transitioning from one walker to another walker in her house and she noticed a pool of bright red blood in her seat, and then stated that there was a "gush" of blood when she moved from one chair to another.  She called her children who came and got her and brought her to the emergency department.  Patient has not had nausea or vomiting or diarrhea.  She does have constipation; this is a chronic issue for her going back several years.  She says she has a bowel movement maybe once or twice per week.  She does not have any rectal pain or itching.  No decrease in appetite.  She has been having no weakness but with minimal fatigue over the past 3 days.  Patient says she had a colonoscopy several years ago and the doctor said he left a polyp in place near her rectum due to concern for inability to heal properly and patient is worried that this might be the cause of her constipation.  Patient has had several falls in the past that resulted in fractures requiring surgical fixation.  Most notably her left leg has been causing her some discomfort and for at least the past year she has not been able to go to church because of the leg discomfort.  It is unclear if her pain has worsened over the past several days or if it is at baseline.  Patient was unclear after repeated questions.  Patient lives alone after her husband died 2 months ago.  Her children live nearby and come visit her often.  Patient's describes herself as very independent.  Review Of Systems: Per HPI with the following additions:   Review of Systems  Constitutional: Negative for chills and fever.  Respiratory: Negative for cough and shortness of breath.   Cardiovascular: Negative for chest pain, palpitations and leg swelling.  Gastrointestinal: Positive  for blood in stool and constipation. Negative for abdominal pain, diarrhea, nausea and vomiting.  Skin: Negative for itching.  Neurological: Negative for dizziness and headaches.  Psychiatric/Behavioral: Negative for substance abuse.    Patient Active Problem List   Diagnosis Date Noted  . Ganglion cyst of dorsum of right wrist 07/18/2017  . Closed nondisplaced fracture of proximal phalanx of right great toe 07/18/2017  . Ankle fracture 10/13/2015  . Closed left ankle fracture 10/12/2015  . GERD (gastroesophageal reflux disease) 10/12/2015  . Anxiety 10/12/2015  . Fall   . Hip fracture ([place]) 07/28/2014  . Protein-calorie malnutrition, severe ([place]) 07/28/2014  . Dizziness 04/24/2012  . Restless leg 03/15/2011  . Atrial flutter ([place]) 08/17/2010  . Essential hypertension 08/16/2010  . Coronary atherosclerosis 08/16/2010  . PVD 08/16/2010    Past Medical History: Past Medical History:  Diagnosis Date  . Ankle fracture, left 09/2015  . Arthritis   . Atrial flutter ([place])   . 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 Coronary artery disease   . Dysrhythmia    ATRIAL FLUTTER  . GERD (gastroesophageal reflux disease)   . Hypertension   . Peripheral arterial disease ([place])   . PVD (peripheral vascular disease) ([place])     Past Surgical History: Past Surgical History:  Procedure Laterality Date  . ABDOMINAL HYSTERECTOMY    . ATRIAL FLUTTER ABLATION N/A 05/22/2013   Procedure: ATRIAL FLUTTER ABLATION;  Surgeon: Evans Lance, MD;  Location: [facility] CATH LAB;  Service: Cardiovascular;  Laterality: N/A;  . BPG    . CAROTID STENT  11/30/09   LAD S/P Right CA DES  . FRACTURE SURGERY Left Feb. 2, 2016   Left Hip Fx   . GLIAYTE CATHERTER INSERTION  06/27/10  . INTRAMEDULLARY (IM) NAIL INTERTROCHANTERIC Left 07/28/2014   Procedure: Left Affixus Nail;  Surgeon: Marybelle Killings, MD;  Location: [facility];  Service: Orthopedics;  Laterality: Left;  . PR VEIN BYPASS GRAFT,AORTO-FEM-POP     Right     Social  History: Social History   Tobacco Use  . Smoking status: Never Smoker  . Smokeless tobacco: Never Used  Substance Use Topics  . Alcohol use: No  . Drug use: No   Additional social history:  Lives alone, family lives in [place] and checks in on patient daily, husband passed away 2 months ago Please also refer to relevant sections of EMR.  Family History: Family History  Problem Relation Age of Onset  . COPD Father   . Hypertension Father     Allergies and Medications: Allergies  Allergen Reactions  . Fexofenadine Shortness Of Breath  . Cephalosporins Rash  . Diltiazem Other (See Comments)    Dizziness   . Penicillins Nausea And Vomiting, Swelling and Rash  . Amlodipine Other (See Comments)  . Anaprox [Naproxen Sodium] Other (See Comments)  . Benzonatate Other (See Comments)  . Cephalexin Other (See Comments)  . Clindamycin/Lincomycin Other (See Comments)  . Clorazepate Other (See Comments)  . Codeine Other (See Comments)  . Doxycycline Other (See Comments)  . Hydralazine Other (See Comments)  . Labetalol Other (See Comments)  . Lisinopril Other (See Comments)  . Phenylephrine Other (See Comments)  . Phenytoin Other (See Comments)  . Zolpidem Other (See Comments)   No current facility-administered medications on file prior to encounter.    Current Outpatient Medications on File Prior to Encounter  Medication Sig Dispense Refill  . carvedilol (COREG) 6.25 MG tablet Take 1 tablet (6.25 mg total) by mouth 2 (two) times daily with a meal. Okay to take one additional tablet daily as needed SBP > 150. (Patient not taking: Reported on 03/12/2018) 180 tablet 3  . furosemide (LASIX) 20 MG tablet Take 20 mg by mouth daily.    Marland Kitchen losartan (COZAAR) 50 MG tablet Take 50 mg by mouth daily.    . Lutein 20 MG TABS Take 20 mg by mouth daily.    . magnesium gluconate (MAGONATE) 500 MG tablet Take 500 mg by mouth 2 (two) times daily.      . multivitamin (THERAGRAN) per tablet Take 1  tablet by mouth daily.      . nitroGLYCERIN (NITROSTAT) 0.4 MG SL tablet Place 1 tablet (0.4 mg total) under the tongue every 5 (five) minutes as needed for chest pain. 25 tablet 12  . Potassium 99 MG TABS Take 1 tablet by mouth daily.      Marland Kitchen VITAMIN D, CHOLECALCIFEROL, PO Take 1,000 mg by mouth daily.  Objective: BP (!) 175/99   Pulse 64   Temp (!) 97.5 F (36.4 C) (Oral)   Resp (!) 22   SpO2 100%  Exam: General: Alert and oriented no acute distress.  Cachectic Eyes: EOMI, PERRLA.  Slightly pale palpebral conjunctiva. ENTM: Moist oral mucosa.  No erythema or bleeding of the oropharynx. Neck: Soft, nontender.  Thyroid midline.  No thyromegaly.  No lymphadenopathy. Cardiovascular: Systolic murmur grade 3/6.  Regularly irregular heartbeat.  2+ radial pulses bilaterally. Respiratory: Lungs clear to auscultation bilaterally.  No wheezes or crackles.  No increased work of breathing. Gastrointestinal: Soft nontender to palpation.  Normal bowel sounds. MSK: 5/5 strength in upper and lower extremities bilaterally. Derm: Some areas of bruising on the extremities.  No rashes.  Gland cyst on right wrist GU: external hemorrhoids with small amount of oozing blood Neuro: Cranial nerves II through XII grossly intact.  Sensation to light touch intact. Psych: Pleasant affect.  Makes eye contact.  Able to have coherent conversation.  Labs and Imaging: CBC BMET  Recent Labs  Lab 10/22/18 2054  WBC 8.5  HGB 11.6*  HCT 36.4  PLT 153   Recent Labs  Lab 10/22/18 2054  NA 142  K 3.1*  CL 103  CO2 27  BUN 33*  CREATININE 1.16*  GLUCOSE 107*  CALCIUM 9.1       Benay Pike, MD 10/22/2018, 11:23 PM PGY-1, [facility] Intern pager: [phone], text pages welcome   FPTS Upper-Level Resident Addendum   I have independently interviewed and examined the patient. I have discussed the above with the original author and agree with their documentation. My edits for  correction/addition/clarification are in pink. Please see also any attending notes.    Lucila Maine, DO PGY-3, [place] Service pager: [phone] (text pages welcome through [place])

## 2018-10-23 ENCOUNTER — Encounter (HOSPITAL_COMMUNITY): Payer: Self-pay | Admitting: *Deleted

## 2018-10-23 DIAGNOSIS — K625 Hemorrhage of anus and rectum: Secondary | ICD-10-CM

## 2018-10-23 DIAGNOSIS — K5641 Fecal impaction: Secondary | ICD-10-CM | POA: Diagnosis not present

## 2018-10-23 DIAGNOSIS — L8993 Pressure ulcer of unspecified site, stage 3: Secondary | ICD-10-CM

## 2018-10-23 DIAGNOSIS — K922 Gastrointestinal hemorrhage, unspecified: Secondary | ICD-10-CM | POA: Diagnosis present

## 2018-10-23 DIAGNOSIS — L8990 Pressure ulcer of unspecified site, unspecified stage: Secondary | ICD-10-CM

## 2018-10-23 LAB — BASIC METABOLIC PANEL
Anion gap: 8 (ref 5–15)
BUN: 32 mg/dL — ABNORMAL HIGH (ref 8–23)
CO2: 28 mmol/L (ref 22–32)
Calcium: 8.5 mg/dL — ABNORMAL LOW (ref 8.9–10.3)
Chloride: 106 mmol/L (ref 98–111)
Creatinine, Ser: 0.94 mg/dL (ref 0.44–1.00)
GFR calc Af Amer: >60 mL/min (ref >60)
GFR calc non Af Amer: 54 mL/min — ABNORMAL LOW (ref >60)
Glucose, Bld: 106 mg/dL — ABNORMAL HIGH (ref 70–99)
Potassium: 3.6 mmol/L (ref 3.5–5.1)
Sodium: 142 mmol/L (ref 135–145)

## 2018-10-23 LAB — CBC
HCT: 24.3 % — ABNORMAL LOW (ref 36.0–46.0)
HCT: 23.8 % — ABNORMAL LOW (ref 36.0–46.0)
HCT: 24.5 % — ABNORMAL LOW (ref 36.0–46.0)
Hemoglobin: 8 g/dL — ABNORMAL LOW (ref 12.0–15.0)
Hemoglobin: 7.7 g/dL — ABNORMAL LOW (ref 12.0–15.0)
Hemoglobin: 7.9 g/dL — ABNORMAL LOW (ref 12.0–15.0)
MCH: 32.8 pg (ref 26.0–34.0)
MCH: 32.4 pg (ref 26.0–34.0)
MCH: 32.4 pg (ref 26.0–34.0)
MCHC: 32.9 g/dL (ref 30.0–36.0)
MCHC: 32.4 g/dL (ref 30.0–36.0)
MCHC: 32.2 g/dL (ref 30.0–36.0)
MCV: 99.6 fL (ref 80.0–100.0)
MCV: 100 fL (ref 80.0–100.0)
MCV: 100.4 fL — ABNORMAL HIGH (ref 80.0–100.0)
Platelets: 122 10*3/uL — ABNORMAL LOW (ref 150–400)
Platelets: 132 10*3/uL — ABNORMAL LOW (ref 150–400)
Platelets: 138 10*3/uL — ABNORMAL LOW (ref 150–400)
RBC: 2.44 MIL/uL — ABNORMAL LOW (ref 3.87–5.11)
RBC: 2.38 MIL/uL — ABNORMAL LOW (ref 3.87–5.11)
RBC: 2.44 MIL/uL — ABNORMAL LOW (ref 3.87–5.11)
RDW: 14.1 % (ref 11.5–15.5)
RDW: 14.2 % (ref 11.5–15.5)
RDW: 14.3 % (ref 11.5–15.5)
WBC: 9.1 10*3/uL (ref 4.0–10.5)
WBC: 9.7 10*3/uL (ref 4.0–10.5)
WBC: 14.9 10*3/uL — ABNORMAL HIGH (ref 4.0–10.5)
nRBC: 0 % (ref 0.0–0.2)
nRBC: 0 % (ref 0.0–0.2)
nRBC: 0 % (ref 0.0–0.2)

## 2018-10-23 LAB — APTT: aPTT: 35 seconds (ref 24–36)

## 2018-10-23 LAB — PROTIME-INR
INR: 1.3 — ABNORMAL HIGH (ref 0.8–1.2)
Prothrombin Time: 16.4 seconds — ABNORMAL HIGH (ref 11.4–15.2)

## 2018-10-23 LAB — MAGNESIUM: Magnesium: 2 mg/dL (ref 1.7–2.4)

## 2018-10-23 MED ORDER — ACETAMINOPHEN 650 MG RE SUPP: 650.0000 mg | Freq: Four times a day (QID) | RECTAL | Status: DC | PRN

## 2018-10-23 MED ORDER — SODIUM CHLORIDE 0.9 % IV SOLN: 250.0000 mL | INTRAVENOUS | Status: DC | PRN

## 2018-10-23 MED ORDER — SODIUM CHLORIDE 0.9 % IV SOLN: INTRAVENOUS | Status: DC

## 2018-10-23 MED ORDER — POLYETHYLENE GLYCOL 3350 17 G PO PACK
17.0000 g | PACK | Freq: Every day | ORAL | Status: DC
  Administered 2018-10-23: 17 g via ORAL
  Filled 2018-10-23: qty 1

## 2018-10-23 MED ORDER — POTASSIUM CHLORIDE CRYS ER 20 MEQ PO TBCR
40.0000 meq | EXTENDED_RELEASE_TABLET | Freq: Once | ORAL | Status: AC
  Administered 2018-10-23: 40 meq via ORAL
  Filled 2018-10-23: qty 2

## 2018-10-23 MED ORDER — ADULT MULTIVITAMIN W/MINERALS CH
1.0000 | ORAL_TABLET | Freq: Every day | ORAL | Status: DC
  Administered 2018-10-23 – 2018-10-28 (×6): 1 via ORAL
  Filled 2018-10-23 (×6): qty 1

## 2018-10-23 MED ORDER — SODIUM CHLORIDE 0.9% FLUSH
3.0000 mL | Freq: Two times a day (BID) | INTRAVENOUS | Status: DC
  Administered 2018-10-23 – 2018-10-28 (×8): 3 mL via INTRAVENOUS

## 2018-10-23 MED ORDER — POLYETHYLENE GLYCOL 3350 17 GM/SCOOP PO POWD
1.0000 | Freq: Once | ORAL | Status: DC
  Filled 2018-10-23 (×2): qty 255

## 2018-10-23 MED ORDER — LOSARTAN POTASSIUM 50 MG PO TABS
50.0000 mg | ORAL_TABLET | Freq: Every day | ORAL | Status: DC
  Administered 2018-10-23 – 2018-10-28 (×6): 50 mg via ORAL
  Filled 2018-10-23 (×6): qty 1

## 2018-10-23 MED ORDER — SODIUM CHLORIDE 0.9 % IV SOLN
INTRAVENOUS | Status: AC
  Administered 2018-10-23: 11:00:00 via INTRAVENOUS

## 2018-10-23 MED ORDER — ENSURE ENLIVE PO LIQD
237.0000 mL | Freq: Two times a day (BID) | ORAL | Status: DC
  Administered 2018-10-23 – 2018-10-24 (×2): 237 mL via ORAL

## 2018-10-23 MED ORDER — VITAMIN D 25 MCG (1000 UNIT) PO TABS
1000.0000 [IU] | ORAL_TABLET | Freq: Every day | ORAL | Status: DC
  Administered 2018-10-23 – 2018-10-28 (×6): 1000 [IU] via ORAL
  Filled 2018-10-23 (×6): qty 1

## 2018-10-23 MED ORDER — ACETAMINOPHEN 325 MG PO TABS
650.0000 mg | ORAL_TABLET | Freq: Four times a day (QID) | ORAL | Status: DC | PRN
  Administered 2018-10-23 – 2018-10-24 (×3): 650 mg via ORAL
  Filled 2018-10-23 (×3): qty 2

## 2018-10-23 MED ORDER — DOCUSATE SODIUM 100 MG PO CAPS
100.0000 mg | ORAL_CAPSULE | Freq: Two times a day (BID) | ORAL | Status: DC
  Administered 2018-10-23 (×2): 100 mg via ORAL
  Filled 2018-10-23 (×2): qty 1

## 2018-10-23 MED ORDER — SODIUM CHLORIDE 0.9% FLUSH
3.0000 mL | INTRAVENOUS | Status: DC | PRN
  Administered 2018-10-24: 3 mL via INTRAVENOUS
  Filled 2018-10-23: qty 3

## 2018-10-23 NOTE — Progress Notes (Signed)
Family Medicine Teaching Service Daily Progress Note Intern Pager: [phone]  Patient name: Michelle Gaines record number: [id number] Date of birth: 1929-04-28 Age: 85 y.o. Gender: female  Primary Care Provider: Levin Erp, MD Consultants: GI Code Status: DNR  Pt Overview and Major Events to Date:  4/30 - admitted to [facility], Abdominal x-ray: Large amount of stool in rectosigmoid colon. No evidence of bowel obstruction or free air.  Assessment and Plan: Michelle Gaines is a 85 y.o. female presenting with a 1 day history of GI bleeding likely secondary to internal hemorrhoids or diverticulosis.  Patient is hemodynamically stable with a hemoglobin over 11 patient also has chronic left leg pain with no swelling.Marland Kitchen PMH is significant for atrial flutter, hypertension, PVD, falls  Lower GI Bleed  Anemia d/t Acute Bleed  Constipation  Differential include internal Hemorid vs bleeding polyp vs diverticulosis vs AV malformation vs low suspicion for neoplasm given stable weight. Abdominal x-ray significant for large stool burden in rectosigmoid colon without evidence of bowel obstruction. Last colonoscopy in 2013 that showed internal and external hemorrhoids and a small anal polyp. Initial exam this AM, no active bleed per rectum, abdomen nontender, visible hemorrhoids on inspection, and only minor spotting on pad. However later paged by nurse given large bowel movement with bright red blood. Stat CBC obtained with decrease in Hgb from 11>8>7.7. GI consulted. IV fluids started. Vital signs have remained stable throughout. On repeat exam, patient appears pail and unwell, however stable.  - Frequent vital check  - Follow up PM CBC - Type and screen - Transfuse if <7 - Per GI, likely lower GI bleed from fecal impaction / possible stercoral ulcer from impaction. Recommend aggressive laxatives to clear impaction and monitor bleed. Tap water enemas if intolerant to Miralax (1/2 gallon). Do not  recommend endoscopic procedures given age and comorbidities. Recommend flexible sigmoidoscopy if bleeding persists.  - Regular diet - 78mL NS mIVF x 6 hours - monitor for CP - obtain EKG, trend trops, call cards   Constipation: Appears patient has baseline constipation with bowel movements 1-2 times per week. Large stool burden on x-ray. - S/p Enema in ED - See plan above  Left leg pain  History of Falls: This is a chronic issue for this patient, however notes worsening left calf pain that extend to left hip x 5 days. Unable to specificly if worse if walking or constant. History of falls with surgical intervention for left hip fracture and right toe fracture in 2016 and 2019. Ambulates at baseline with a walker. Noted pain overnight that was relieved with tylenol. Mild tenderness to left calf when squeezed. However, no swelling appreciated and calf diameter symmetric bilaterally. DVT on differential given pain, however low suspicion given lack of swelling.  - continue to moniitor - consider LE ultrasound if continues - tylenol for pain - PT/OT consulted, will follow up recs  Hypertension:  Elevated BP on admission. Improved and stable this AM 137/89. Home meds: Losartan 50mg  and Lasix 20mg . - continue home Losartan 50mg  QD, stop if BP drops  - hold lasix   Peripheral Vascular Disease: History of peripheral vascular disease with external iliac to profunda bypass in 2009. Appointment with vascular surgery on  01/14/2018 showed symmetric normal ABI on the left. Also had work up for DVT in 12/13/2017 which was negative bilaterally. Home meds; Aspirin  - hold home aspirin due to active bleed  History of atrial flutter s/p Ablation: Followed by cardiology. Had atrial flutter ablation  in 2014. Patient has been refusing systemic anticoagulation per cards note. Patient is elderly and a fall risk as well. Patient also not on BB's due to intolerance. EKG this AM with sinus arrhythmia. Some concern for  heart block with atypical/changing p-waves in addition to t-wave inversion that appear to be chronic. Currently asymptomatic.  - continue to monitor for chest pain - obtain repeat EKG, troponins, and call cardiology if occurs given acute bleed - recommend follow up with cardiology outpatient  Prolonged QTc: EKG significant for QTc of 527. - Avoid QT prolonging meds   Hypokalemia: improved K 3.1>3.6 s/p 63mEq K-dur. Mag 2.0. - continue to monitor   Protein-Calorie Malnutrition: Patient cachectic and frail.  - Nutrition consulted, will follow up  FEN/GI: Regular diet PPx: SCDs  Disposition: Pending improvement  Subjective:  Patient notes feeling weak. Denies any abdominal pain. Notes some new left leg pain that hurts along her calf and extends to her hip. It was on her overnight but was given Tylenol and that helped.  Notes rectal bleeding for 1 day. Denies any weight loss.   Objective: Temp:  [97.5 F (36.4 C)-98.2 F (36.8 C)] 97.9 F (36.6 C) (04/30 1239) Pulse Rate:  [61-84] 71 (04/30 1239) Resp:  [14-23] 16 (04/30 1239) BP: (137-177)/(71-99) 144/73 (04/30 1239) SpO2:  [97 %-100 %] 98 % (04/30 1239) Physical Exam: General: thin elderly woman, appears pail, in no acute distress, non-toxic appearance, lying comfortably in hospital bed HEENT: normocephalic, atraumatic Neck: supple, normal ROM CV: systolic murmur grade 3/6, no lower extremity edema, 2+ radial and pedal pulses b/l  Lungs: clear to auscultation bilaterally with normal work of breathing on RA Abdomen: soft, non-tender, non-distended, normoactive bowel sounds Skin: warm, dry, no rashes or lesions Extremities: normal tone, some tenderness to left calf when squeezed, symmetric calf diameter bilaterally, warm and well perfused bilaterally, no LE swelling or warmth appreciated. Larger superficial veins appreciated bilaterally - nontender to palpation Neuro: Alert and oriented, speech normal  Laboratory: Recent Labs   Lab 10/22/18 2054 10/23/18 0710 10/23/18 1041  WBC 8.5 9.1 9.7  HGB 11.6* 8.0* 7.7*  HCT 36.4 24.3* 23.8*  PLT 153 122* 132*   Recent Labs  Lab 10/22/18 2054 10/23/18 0710  NA 142 142  K 3.1* 3.6  CL 103 106  CO2 27 28  BUN 33* 32*  CREATININE 1.16* 0.94  CALCIUM 9.1 8.5*  PROT 6.3*  --   BILITOT 0.7  --   ALKPHOS 118  --   ALT 23  --   AST 37  --   GLUCOSE 107* 106*    Mag: 2.0 FOBT: positive  Imaging/Diagnostic Tests: Dg Abd Acute W/chest  Result Date: 10/22/2018 CLINICAL DATA:  Hematochezia beginning today. EXAM: DG ABDOMEN ACUTE W/ 1V CHEST COMPARISON:  05/20/2013 FINDINGS: Cardiomegaly is increased since previous study. Aortic atherosclerosis. Elevation of left hemidiaphragm is seen. Mild left pleural thickening versus small effusion. No evidence of pulmonary infiltrate or edema. Pulmonary hyperinflation is consistent with COPD. Large amount of stool is seen in the rectosigmoid colon. No evidence of dilated bowel loops or free intraperitoneal air. Extensive aortoiliac atherosclerotic calcification noted. IMPRESSION: 1. Large amount of stool in rectosigmoid colon. No evidence of bowel obstruction or free air. 2. Cardiomegaly and COPD. 3. Elevated left hemidiaphragm. Mild left pleural thickening versus small effusion. Electronically Signed   By: Earle Gell M.D.   On: 10/22/2018 22:34    Danna Hefty, DO 10/23/2018, 1:10 PM PGY-1, [place]  [place] Intern pager: [phone], text pages welcome

## 2018-10-23 NOTE — Consult Note (Addendum)
Referring Provider: Dr. Mingo Amber Primary Care Physician:  Levin Erp, MD Primary Gastroenterologist:  Dr. Oletta Lamas  Reason for Consultation:  Rectal bleeding  HPI: Michelle Gaines is a 85 y.o. female with multiple medical problems as stated below presents with painless hematochezia X 1 day and constipation. She has a history of chronic constipation. Reports that she normally moves her bowels daily but has been constipated (unable to tell me the last time she had a BM prior to admit). Rectal bleeding bright red. Denies abdominal pain. Complaining of feeling severely constipated. Having large amount of maroon-colored stool in bed with nurse present during my evaluation. Last colonoscopy in 2013 that showed internal and external hemorrhoids and a small anal polyp.  Past Medical History:  Diagnosis Date  . Ankle fracture, left 09/2015  . Arthritis   . Atrial flutter ([place])   . Cancer ([place])    OVARIAN    1967  . Coronary artery disease   . Dysrhythmia    ATRIAL FLUTTER  . GERD (gastroesophageal reflux disease)   . Hypertension   . Peripheral arterial disease ([place])   . PVD (peripheral vascular disease) ([place])     Past Surgical History:  Procedure Laterality Date  . ABDOMINAL HYSTERECTOMY    . ATRIAL FLUTTER ABLATION N/A 05/22/2013   Procedure: ATRIAL FLUTTER ABLATION;  Surgeon: Evans Lance, MD;  Location: [facility] CATH LAB;  Service: Cardiovascular;  Laterality: N/A;  . BPG    . CAROTID STENT  11/30/09   LAD S/P Right CA DES  . FRACTURE SURGERY Left Feb. 2, 2016   Left Hip Fx   . GLIAYTE CATHERTER INSERTION  06/27/10  . INTRAMEDULLARY (IM) NAIL INTERTROCHANTERIC Left 07/28/2014   Procedure: Left Affixus Nail;  Surgeon: Marybelle Killings, MD;  Location: [facility];  Service: Orthopedics;  Laterality: Left;  . PR VEIN BYPASS GRAFT,AORTO-FEM-POP     Right     Prior to Admission medications   Medication Sig Start Date End Date Taking? Authorizing Provider  cholecalciferol (VITAMIN D3) 25 MCG (1000 UT)  tablet Take 1,000 Units by mouth daily.   Yes [provider]  furosemide (LASIX) 20 MG tablet Take 20 mg by mouth daily. 01/21/18  Yes [provider]  losartan (COZAAR) 50 MG tablet Take 50 mg by mouth daily. 01/21/18  Yes [provider]  Lutein 20 MG TABS Take 20 mg by mouth daily.   Yes [provider]  magnesium gluconate (MAGONATE) 500 MG tablet Take 500 mg by mouth 2 (two) times daily.     Yes [provider]  multivitamin [facility] per tablet Take 1 tablet by mouth daily.     Yes [provider]  nitroGLYCERIN (NITROSTAT) 0.4 MG SL tablet Place 1 tablet (0.4 mg total) under the tongue every 5 (five) minutes as needed for chest pain. 05/23/13  Yes Barrett, Evelene Croon, PA-C  Potassium 99 MG TABS Take 1 tablet by mouth daily.     Yes [provider]    Scheduled Meds: . cholecalciferol  1,000 Units Oral Daily  . losartan  50 mg Oral Daily  . multivitamin with minerals  1 tablet Oral Daily  . sodium chloride flush  3 mL Intravenous Q12H   Continuous Infusions: . sodium chloride    . sodium chloride     PRN Meds:.sodium chloride, acetaminophen **OR** acetaminophen, sodium chloride flush  Allergies as of 10/22/2018 - Review Complete 10/22/2018  Allergen Reaction Noted  . Fexofenadine Shortness Of Breath 03/15/2011  .  Cephalosporins Rash 12/25/2011  . Diltiazem Other (See Comments) 05/20/2013  . Penicillins Nausea And Vomiting, Swelling, and Rash 07/21/2010  . Amlodipine Other (See Comments)   . Anaprox [naproxen sodium] Other (See Comments) 12/25/2011  . Benzonatate Other (See Comments) 12/25/2011  . Cephalexin Other (See Comments)   . Clindamycin/lincomycin Other (See Comments) 03/15/2011  . Clorazepate Other (See Comments) 03/15/2011  . Codeine Other (See Comments)   . Doxycycline Other (See Comments)   . Hydralazine Other (See Comments) 03/15/2011  . Labetalol Other (See Comments) 03/15/2011  . Lisinopril Other  (See Comments) 03/15/2011  . Phenylephrine Other (See Comments) 03/15/2011  . Phenytoin Other (See Comments) 12/25/2011  . Zolpidem Other (See Comments) 03/15/2011    Family History  Problem Relation Age of Onset  . COPD Father   . Hypertension Father     Social History   Socioeconomic History  . Marital status: Widowed    Spouse name: Not on file  . Number of children: Not on file  . Years of education: Not on file  . Highest education level: Not on file  Occupational History  . Not on file  Social Needs  . Financial resource strain: Not on file  . Food insecurity:    Worry: Not on file    Inability: Not on file  . Transportation needs:    Medical: Not on file    Non-medical: Not on file  Tobacco Use  . Smoking status: Never Smoker  . Smokeless tobacco: Never Used  Substance and Sexual Activity  . Alcohol use: No  . Drug use: No  . Sexual activity: Not on file  Lifestyle  . Physical activity:    Days per week: Not on file    Minutes per session: Not on file  . Stress: Not on file  Relationships  . Social connections:    Talks on phone: Not on file    Gets together: Not on file    Attends religious service: Not on file    Active member of club or organization: Not on file    Attends meetings of clubs or organizations: Not on file    Relationship status: Not on file  . Intimate partner violence:    Fear of current or ex partner: Not on file    Emotionally abused: Not on file    Physically abused: Not on file    Forced sexual activity: Not on file  Other Topics Concern  . Not on file  Social History Narrative  . Not on file    Review of Systems: All negative except as stated above in HPI.  Physical Exam: Vital signs: Vitals:   10/23/18 0542 10/23/18 1025  BP: 137/89 (!) 160/76  Pulse: 70 84  Resp: 16 19  Temp: 98.1 F (36.7 C) 98.2 F (36.8 C)  SpO2: 99% 97%     General:  Lethargic, elderly, cachetic, oriented X 3, mild acute distress Head:  normocephalic, atraumatic Eyes: anicteric sclera ENT: oropharynx clear Neck: supple, nontender Lungs:  Clear throughout to auscultation.   No wheezes, crackles, or rhonchi. No acute distress. Heart:  Regular rate and rhythm; no murmurs, clicks, rubs,  or gallops. Abdomen: distended, LLQ tenderness with guarding, flat, +BS  Rectal:  Small external hemorrhoids, tender DRE (limited due to large amount of stool in vault), large amount of maroon-colored stool noted Ext: no edema  GI:  Lab Results: Recent Labs    10/22/18 2054 10/23/18 0710  WBC 8.5 9.1  HGB 11.6* 8.0*  HCT 36.4 24.3*  PLT 153 122*   BMET Recent Labs    10/22/18 2054 10/23/18 0710  NA 142 142  K 3.1* 3.6  CL 103 106  CO2 27 28  GLUCOSE 107* 106*  BUN 33* 32*  CREATININE 1.16* 0.94  CALCIUM 9.1 8.5*   LFT Recent Labs    10/22/18 2054  PROT 6.3*  ALBUMIN 3.4*  AST 37  ALT 23  ALKPHOS 118  BILITOT 0.7   PT/INR Recent Labs    10/23/18 0710  LABPROT 16.4*  INR 1.3*     Studies/Results: Dg Abd Acute W/chest  Result Date: 10/22/2018 CLINICAL DATA:  Hematochezia beginning today. EXAM: DG ABDOMEN ACUTE W/ 1V CHEST COMPARISON:  05/20/2013 FINDINGS: Cardiomegaly is increased since previous study. Aortic atherosclerosis. Elevation of left hemidiaphragm is seen. Mild left pleural thickening versus small effusion. No evidence of pulmonary infiltrate or edema. Pulmonary hyperinflation is consistent with COPD. Large amount of stool is seen in the rectosigmoid colon. No evidence of dilated bowel loops or free intraperitoneal air. Extensive aortoiliac atherosclerotic calcification noted. IMPRESSION: 1. Large amount of stool in rectosigmoid colon. No evidence of bowel obstruction or free air. 2. Cardiomegaly and COPD. 3. Elevated left hemidiaphragm. Mild left pleural thickening versus small effusion. Electronically Signed   By: Earle Gell M.D.   On: 10/22/2018 22:34    Impression/Plan: Lower GI bleed likely due  to a fecal impaction and may have a stercoral ulcer from the impaction. Aggressive laxatives to clear impaction and see if bleeding continues. Tap water enemas may be needed if unable to tolerate increase in dose of Miralax (will give a half gallon of Miralax due to amount of stool present). Follow H/Hs. Would NOT recommend any endoscopic procedures due to advanced age and comorbidities unless bleeding persists after resolution of the impaction and then may need a flexible sigmoidoscopy to evaluate for a stercoral ulcer or other source for the bleeding. For now would manage conservatively with aggressive laxatives. Will sign off. Call us back if bleeding persists after clearance of the impaction.     LOS: 0 days   Lear Ng  10/23/2018, 11:00 AM  Questions please call [phone]

## 2018-10-23 NOTE — Progress Notes (Signed)
Tap water enema was done. Will continue to monitor patient.

## 2018-10-23 NOTE — Progress Notes (Signed)
Occupational Therapy Evaluation Patient Details Name: Michelle Gaines MRN: [id number] DOB: 01/02/1929 Today's Date: 10/23/2018    History of Present Illness Pt is 85 y.o. female presenting with 1 day history of GI bleeding. PMH includes chronic constipation, atrial flutter, hypertension, PVD, falls.    Clinical Impression   Pt admitted secondary to problem listed above.  Pt reports feeling very weak and tired today and also c/o left LE pain with movement.  PTA, pt is independent with RW with BADLs and receives intermittent assist from family at home.  Pt performed bed mobility with min assist but immediately upon sitting up requested to lay back down due to left LE pain.  Per RN report, pt with bloody stools all morning, so is likely fatigued from that.  At this time, recommending SNF for discharge planning. Pending pt progress and if family is available for 24/7 supervision/assist, may change plan to [facility].  Will continue to follow acutely in order to maximize safety and independence with ADLs.    Follow Up Recommendations  SNF;Supervision/Assistance - 24 hour    Equipment Recommendations  Other (comment)(defer to next venue)    Recommendations for Other Services PT consult     Precautions / Restrictions Precautions Precautions: Fall      Mobility Bed Mobility Overal bed mobility: Needs Assistance Bed Mobility: Supine to Sit;Sit to Supine     Supine to sit: Min assist;HOB elevated Sit to supine: Min assist;HOB elevated   General bed mobility comments: Pt using bed rail to pull herself toward EOB. Therapist assisting to scoot hips EOB.   Transfers                      Balance                                           ADL either performed or assessed with clinical judgement   ADL Overall ADL's : Needs assistance/impaired     Grooming: Set up;Wash/dry hands;Wash/dry face;Brushing hair;Bed level   Upper Body Bathing: Minimal assistance;Bed  level   Lower Body Bathing: Moderate assistance;Bed level   Upper Body Dressing : Minimal assistance;Bed level   Lower Body Dressing: Maximal assistance;Bed level                 General ADL Comments: Pt refusing OOB mobility.      Vision         Perception     Praxis      Pertinent Vitals/Pain Pain Assessment: Faces Faces Pain Scale: Hurts even more Pain Location: left leg Pain Descriptors / Indicators: Aching;Discomfort Pain Intervention(s): Limited activity within patient's tolerance;Monitored during session;Repositioned     Hand Dominance Right   Extremity/Trunk Assessment Upper Extremity Assessment Upper Extremity Assessment: Generalized weakness   Lower Extremity Assessment Lower Extremity Assessment: Defer to PT evaluation       Communication Communication Communication: No difficulties   Cognition Arousal/Alertness: Awake/alert Behavior During Therapy: WFL for tasks assessed/performed Overall Cognitive Status: No family/caregiver present to determine baseline cognitive functioning Area of Impairment: Problem solving;Following commands                       Following Commands: Follows one step commands with increased time;Follows one step commands consistently     Problem Solving: Slow processing     General Comments  Exercises     Shoulder Instructions      Home Living Family/patient expects to be discharged to:: Private residence Living Arrangements: Alone Available Help at Discharge: Family;Friend(s);Available PRN/intermittently Type of Home: House Home Access: Stairs to enter [employer] of Steps: 1 Entrance Stairs-Rails: None Home Layout: One level     Bathroom Shower/Tub: Occupational psychologist: Standard     Home Equipment: Cane - single point;Walker - 2 wheels;Shower seat          Prior Functioning/Environment Level of Independence: Independent with assistive device(s)         Comments: Pt reports daughter assists with errands and house work.         OT Problem List: Decreased strength;Decreased activity tolerance;Impaired balance (sitting and/or standing);Decreased knowledge of use of DME or AE;Pain      OT Treatment/Interventions: Self-care/ADL training;DME and/or AE instruction;Therapeutic activities;Patient/family education;Balance training    OT Goals(Current goals can be found in the care plan section) Acute Rehab OT Goals Patient Stated Goal: to go home OT Goal Formulation: With patient Time For Goal Achievement: 11/06/18 Potential to Achieve Goals: Fair  OT Frequency: Min 2X/week   Barriers to D/C:            Co-evaluation              AM-PAC OT "6 Clicks" Daily Activity     Outcome Measure Help from another person eating meals?: None Help from another person taking care of personal grooming?: A Little Help from another person toileting, which includes using toliet, bedpan, or urinal?: A Lot Help from another person bathing (including washing, rinsing, drying)?: A Lot Help from another person to put on and taking off regular upper body clothing?: A Little Help from another person to put on and taking off regular lower body clothing?: A Lot 6 Click Score: 16   End of Session Nurse Communication: Mobility status  Activity Tolerance: Patient limited by pain;Patient limited by fatigue Patient left: in bed;with call bell/phone within reach;with bed alarm set  OT Visit Diagnosis: Unsteadiness on feet (R26.81);Muscle weakness (generalized) (M62.81);History of falling (Z91.81);Pain Pain - Right/Left: Left Pain - part of body: Leg                Time: 1455-1515 OT Time Calculation (min): 20 min Charges:  OT General Charges $OT Visit: 1 Visit OT Evaluation $OT Eval Moderate Complexity: 1 Mod    Darrol Jump OTR/L [place] [phone] 10/23/2018, 4:21 PM

## 2018-10-23 NOTE — Progress Notes (Signed)
Initial Nutrition Assessment  DOCUMENTATION CODES:   Not applicable  INTERVENTION:   RD to perform NFPE 5/1.    Ensure Enlive po BID, each supplement provides 350 kcal and 20 grams of protein  MVI daily  Obtain recent weight   NUTRITION DIAGNOSIS:   Increased nutrient needs related to acute illness as evidenced by estimated needs.  GOAL:   Patient will meet greater than or equal to 90% of their needs  MONITOR:   PO intake, Supplement acceptance, Diet advancement, Weight trends, Skin, Labs, I & O's  REASON FOR ASSESSMENT:   Consult Assessment of nutrition requirement/status  ASSESSMENT:   Patient with PMH significant for GERD, severe protein calorie malnutrition, essential HTN, PVD, and coronary arthrosclerosis. Presents this admission with lower GI bleed.    RD working remotely.  Per GI, no plans for endoscopic procedures due to advanced age and commodities. GI bleed likely from fecal impaction.   No wt recorded since 01/14/18. Will need to obtain recent wt to assess for wt loss.   RD to see in person 5/1 to complete physical exam and obtain nutrition history.   No urine output recorded.   Drips: NS @ 75 ml/hr Medications: Vit D, MVI with minerals, miralax Labs: CBG 107  Diet Order:   Diet Order            Diet regular Room service appropriate? Yes; Fluid consistency: Thin  Diet effective now              EDUCATION NEEDS:   Education needs have been addressed  Skin:  Skin Assessment: Skin Integrity Issues: Skin Integrity Issues:: Other (Comment), Stage II Stage II: sacrum Other: non-pressure right pretibial  Last BM:  4/30- bright red stool  Height:   Ht Readings from Last 1 Encounters:  10/23/18 5\' 2"  (1.575 m)    Weight:   Wt Readings from Last 1 Encounters:  01/14/18 38.1 kg    Ideal Body Weight:  50 kg  BMI:  Body mass index is 15.36 kg/m.  Estimated Nutritional Needs:   Kcal:  1250-1450 kcal  Protein:  65-80  grams  Fluid:  >/= 1.3 L/day   Mariana Single RD, LDN Clinical Nutrition Pager # - [phone]

## 2018-10-23 NOTE — Evaluation (Signed)
Physical Therapy Evaluation Patient Details Name: Michelle Gaines MRN: [id number] DOB: Oct 10, 1928 Today's Date: 10/23/2018   History of Present Illness  Pt is 85 y.o. female presenting with 1 day history of GI bleeding. PMH includes chronic constipation, atrial flutter, hypertension, PVD, falls.     Clinical Impression  Pt admitted with above diagnosis. Pt currently with functional limitations due to the deficits listed below (see PT Problem List). PTA, pt living alone at home, family nearby. [place], reports too weak to get out of bed, unable to lift LLE against gravity. Max A for bed mobility at this time.  Pt will benefit from skilled PT to increase their independence and safety with mobility to allow discharge to the venue listed below.       Follow Up Recommendations SNF    Equipment Recommendations  (TBD)    Recommendations for Other Services       Precautions / Restrictions Precautions Precautions: Fall      Mobility  Bed Mobility Overal bed mobility: Needs Assistance Bed Mobility: Supine to Sit;Sit to Supine     Supine to sit: Min assist;HOB elevated Sit to supine: Min assist;HOB elevated   General bed mobility comments: Pt using bed rail to pull herself toward EOB. Therapist assisting to scoot hips EOB.   Transfers                    Ambulation/Gait                Stairs            Wheelchair Mobility    Modified Rankin (Stroke Patients Only)       Balance Overall balance assessment: Needs assistance   Sitting balance-Leahy Scale: Fair       Standing balance-Leahy Scale: Poor                               Pertinent Vitals/Pain Pain Assessment: Faces Faces Pain Scale: Hurts even more Pain Location: left leg Pain Descriptors / Indicators: Aching;Discomfort Pain Intervention(s): Limited activity within patient's tolerance;Monitored during session;Repositioned    Home Living Family/patient expects to be  discharged to:: Private residence Living Arrangements: Alone Available Help at Discharge: Family;Friend(s);Available PRN/intermittently Type of Home: House Home Access: Stairs to enter Entrance Stairs-Rails: None Entrance Stairs-Number of Steps: 1 Home Layout: One level Home Equipment: Cane - single point;Walker - 2 wheels;Shower seat      Prior Function Level of Independence: Independent with assistive device(s)         Comments: Pt reports daughter assists with errands and house work.      Hand Dominance   Dominant Hand: Right    Extremity/Trunk Assessment   Upper Extremity Assessment Upper Extremity Assessment: Generalized weakness    Lower Extremity Assessment Lower Extremity Assessment: Generalized weakness       Communication   Communication: No difficulties  Cognition Arousal/Alertness: Awake/alert Behavior During Therapy: WFL for tasks assessed/performed Overall Cognitive Status: No family/caregiver present to determine baseline cognitive functioning Area of Impairment: Problem solving;Following commands                       Following Commands: Follows one step commands with increased time;Follows one step commands consistently     Problem Solving: Slow processing        General Comments      Exercises     Assessment/Plan    PT Assessment Patient  needs continued PT services  PT Problem List Decreased strength       PT Treatment Interventions DME instruction;Gait training;Functional mobility training    PT Goals (Current goals can be found in the Care Plan section)  Acute Rehab PT Goals Patient Stated Goal: to go home PT Goal Formulation: With patient Potential to Achieve Goals: Fair    Frequency Min 3X/week   Barriers to discharge        Co-evaluation               AM-PAC PT "6 Clicks" Mobility  Outcome Measure Help needed turning from your back to your side while in a flat bed without using bedrails?: A  Lot Help needed moving from lying on your back to sitting on the side of a flat bed without using bedrails?: A Lot Help needed moving to and from a bed to a chair (including a wheelchair)?: Total Help needed standing up from a chair using your arms (e.g., wheelchair or bedside chair)?: Total Help needed to walk in hospital room?: Total Help needed climbing 3-5 steps with a railing? : Total 6 Click Score: 8    End of Session   Activity Tolerance: Patient limited by fatigue Patient left: in bed   PT Visit Diagnosis: Unsteadiness on feet (R26.81)    Time: 1710-1730 PT Time Calculation (min) (ACUTE ONLY): 20 min   Charges:   PT Evaluation $PT Eval Low Complexity: 1 Low          Reinaldo Berber, PT, DPT Acute Rehabilitation Services Pager: [phone] Office: [phone]    Reinaldo Berber 10/23/2018, 5:45 PM

## 2018-10-23 NOTE — Progress Notes (Signed)
OT Cancellation Note  Patient Details Name: Michelle Gaines MRN: [id number] DOB: 1929/04/29   Cancelled Treatment:    Reason Eval/Treat Not Completed: Other (comment). RN reports that pt is having bloody stools this morning and is beginning to feel weak.  Therapist will check back later today if pt is able to participate in OT evaluation.   Darrol Jump OTR/L [place] [phone] 10/23/2018, 10:14 AM

## 2018-10-24 ENCOUNTER — Other Ambulatory Visit: Payer: Self-pay

## 2018-10-24 DIAGNOSIS — K625 Hemorrhage of anus and rectum: Secondary | ICD-10-CM | POA: Diagnosis not present

## 2018-10-24 DIAGNOSIS — K5641 Fecal impaction: Secondary | ICD-10-CM | POA: Diagnosis not present

## 2018-10-24 LAB — BASIC METABOLIC PANEL
Anion gap: 11 (ref 5–15)
BUN: 38 mg/dL — ABNORMAL HIGH (ref 8–23)
CO2: 25 mmol/L (ref 22–32)
Calcium: 9 mg/dL (ref 8.9–10.3)
Chloride: 106 mmol/L (ref 98–111)
Creatinine, Ser: 1.09 mg/dL — ABNORMAL HIGH (ref 0.44–1.00)
GFR calc Af Amer: 52 mL/min — ABNORMAL LOW (ref >60)
GFR calc non Af Amer: 45 mL/min — ABNORMAL LOW (ref >60)
Glucose, Bld: 133 mg/dL — ABNORMAL HIGH (ref 70–99)
Potassium: 3.9 mmol/L (ref 3.5–5.1)
Sodium: 142 mmol/L (ref 135–145)

## 2018-10-24 LAB — CBC
HCT: 22.7 % — ABNORMAL LOW (ref 36.0–46.0)
HCT: 23.2 % — ABNORMAL LOW (ref 36.0–46.0)
Hemoglobin: 7.2 g/dL — ABNORMAL LOW (ref 12.0–15.0)
Hemoglobin: 7.5 g/dL — ABNORMAL LOW (ref 12.0–15.0)
MCH: 32.3 pg (ref 26.0–34.0)
MCH: 32.5 pg (ref 26.0–34.0)
MCHC: 31.7 g/dL (ref 30.0–36.0)
MCHC: 32.3 g/dL (ref 30.0–36.0)
MCV: 101.8 fL — ABNORMAL HIGH (ref 80.0–100.0)
MCV: 100.4 fL — ABNORMAL HIGH (ref 80.0–100.0)
Platelets: 120 10*3/uL — ABNORMAL LOW (ref 150–400)
Platelets: 124 10*3/uL — ABNORMAL LOW (ref 150–400)
RBC: 2.23 MIL/uL — ABNORMAL LOW (ref 3.87–5.11)
RBC: 2.31 MIL/uL — ABNORMAL LOW (ref 3.87–5.11)
RDW: 14.9 % (ref 11.5–15.5)
RDW: 14.8 % (ref 11.5–15.5)
WBC: 22.5 10*3/uL — ABNORMAL HIGH (ref 4.0–10.5)
WBC: 25.7 10*3/uL — ABNORMAL HIGH (ref 4.0–10.5)
nRBC: 0 % (ref 0.0–0.2)
nRBC: 0 % (ref 0.0–0.2)

## 2018-10-24 MED ORDER — POLYETHYLENE GLYCOL 3350 17 G PO PACK
17.0000 g | PACK | Freq: Two times a day (BID) | ORAL | Status: DC
  Administered 2018-10-24 – 2018-10-28 (×7): 17 g via ORAL
  Filled 2018-10-24 (×7): qty 1

## 2018-10-24 MED ORDER — TRAMADOL HCL 50 MG PO TABS: 50.0000 mg | ORAL_TABLET | Freq: Four times a day (QID) | ORAL | Status: DC | PRN

## 2018-10-24 MED ORDER — TRAMADOL HCL 50 MG PO TABS
50.0000 mg | ORAL_TABLET | Freq: Two times a day (BID) | ORAL | Status: DC | PRN
  Administered 2018-10-24 – 2018-10-26 (×3): 50 mg via ORAL
  Filled 2018-10-24 (×3): qty 1

## 2018-10-24 MED ORDER — SENNA 8.6 MG PO TABS
1.0000 | ORAL_TABLET | Freq: Every day | ORAL | Status: DC
  Administered 2018-10-24 – 2018-10-28 (×5): 8.6 mg via ORAL
  Filled 2018-10-24 (×5): qty 1

## 2018-10-24 MED ORDER — DICLOFENAC SODIUM 1 % TD GEL
2.0000 g | Freq: Four times a day (QID) | TRANSDERMAL | Status: DC
  Administered 2018-10-24 – 2018-10-28 (×11): 2 g via TOPICAL
  Filled 2018-10-24: qty 100

## 2018-10-24 MED ORDER — ENSURE ENLIVE PO LIQD
237.0000 mL | Freq: Three times a day (TID) | ORAL | Status: DC
  Administered 2018-10-24 – 2018-10-28 (×11): 237 mL via ORAL

## 2018-10-24 NOTE — Care Management Important Message (Signed)
Important Message  Patient Details  Name: Michelle Gaines MRN: [id number] Date of Birth: 27-Jul-1928   Medicare Important Message Given:  Yes    Bartholomew Crews, RN 10/24/2018, 10:58 AM

## 2018-10-24 NOTE — Progress Notes (Signed)
Patient continues to complain of pain despite Tylenol and K Pad. MD notified. MD stated that she believed that it was mostly D/T patients dementia, but that she would place an order for Voltaren gel.

## 2018-10-24 NOTE — TOC Initial Note (Signed)
Transition of Care [facility]) - Initial/Assessment Note    Patient Details  Name: Michelle Gaines MRN: [id number] Date of Birth: Nov 09, 1928  Transition of Care [facility]) CM/SW Contact:    Alberteen Sam, [place] Phone Number: [phone] 10/24/2018, 10:03 AM  Clinical Narrative:                  CSW consulted with patient's son Richardson Landry regarding recommendation of [facility] for short term rehab at time of patient's discharge. Richardson Landry reports being in agreement with this recommendation as family have been worried about patient living alone the past few years, as patient has had increased medical workup and difficulty moving. He reports preference for [facility] for SNF as family has been there in the past. Richardson Landry reports his mother may need words of encouragement in going to SNF as she has been resistant in the past. Richardson Landry asked for CSW to send referral to [facility], CSW explained insurance authorization process as well as no visitor policy currently, will keep Richardson Landry updated on SNF process and when patient medically nears discharge.   Expected Discharge Plan: Skilled Nursing Facility Barriers to Discharge: Continued Medical Work up   Patient Goals and CMS Choice Patient states their goals for this hospitalization and ongoing recovery are:: Patient's son reports wanting patient to go to rehab and home, patient disoriented X2 CMS Medicare.gov Compare Post Acute Care list provided to:: Patient Represenative (must comment)(Steven (son) ) Choice offered to / list presented to : Adult Children(Steven (son) )  Expected Discharge Plan and Services Expected Discharge Plan: [place]   Discharge Planning Services: NA Post Acute Care Choice: [place] Living arrangements for the past 2 months: Single Family Home Expected Discharge Date: 10/24/18               DME Arranged: N/A DME Agency: NA       HH Arranged: NA HH Agency: NA        Prior Living  Arrangements/Services Living arrangements for the past 2 months: Single Family Home Lives with:: Self Patient language and need for interpreter reviewed:: Yes Do you feel safe going back to the place where you live?: No   requires short to rehab before returning home alone  Need for Family Participation in Patient Care: Yes (Comment) Care giver support system in place?: Yes (comment)   Criminal Activity/Legal Involvement Pertinent to Current Situation/Hospitalization: No - Comment as needed  Activities of Daily Living Home Assistive Devices/Equipment: Blood pressure cuff, Shower chair without back ADL Screening (condition at time of admission) Patient's cognitive ability adequate to safely complete daily activities?: Yes Is the patient deaf or have difficulty hearing?: No Does the patient have difficulty seeing, even when wearing glasses/contacts?: No Does the patient have difficulty concentrating, remembering, or making decisions?: No Patient able to express need for assistance with ADLs?: No Does the patient have difficulty dressing or bathing?: No Independently performs ADLs?: Yes (appropriate for developmental age) Does the patient have difficulty walking or climbing stairs?: Yes Weakness of Legs: Left Weakness of Arms/Hands: None  Permission Sought/Granted Permission sought to share information with : Case Manager, Customer service manager, Family Supports Permission granted to share information with : Yes, Verbal Permission Granted  Share Information with NAME: Remo Lipps  Permission granted to share info w AGENCY: SNFs  Permission granted to share info w Relationship: son  Permission granted to share info w Contact Information: [phone]  Emotional Assessment Appearance:: Appears stated age Attitude/Demeanor/Rapport: Unable to Assess  Affect (typically observed): Unable to Assess Orientation: : Oriented to Self, Oriented to Situation Alcohol / Substance Use: Not  Applicable Psych Involvement: No (comment)  Admission diagnosis:  Fecal impaction ([place]) [K56.41] Rectal bleeding [K62.5] Left leg pain [M79.605] Patient Active Problem List   Diagnosis Date Noted  . GI bleed 10/23/2018  . Pressure injury of skin 10/23/2018  . Fecal impaction ([place])   . Rectal bleeding   . Ganglion cyst of dorsum of right wrist 07/18/2017  . Closed nondisplaced fracture of proximal phalanx of right great toe 07/18/2017  . Ankle fracture 10/13/2015  . Closed left ankle fracture 10/12/2015  . GERD (gastroesophageal reflux disease) 10/12/2015  . Anxiety 10/12/2015  . Fall   . Hip fracture ([place]) 07/28/2014  . Protein-calorie malnutrition, severe ([place]) 07/28/2014  . Dizziness 04/24/2012  . Restless leg 03/15/2011  . Atrial flutter ([place]) 08/17/2010  . Essential hypertension 08/16/2010  . Coronary atherosclerosis 08/16/2010  . PVD 08/16/2010   PCP:  Levin Erp, MD Pharmacy:   [facility], [place] - [address] [place] [address] Phone: [phone] Fax: [phone]     Social Determinants of Health (SDOH) Interventions    Readmission Risk Interventions No flowsheet data found.

## 2018-10-24 NOTE — Discharge Summary (Signed)
[facility] Discharge Summary  Patient name: Michelle Gaines record number: [id number] Date of birth: [birth year] Age: 85 y.o. Gender: female Date of Admission: 10/22/2018  Date of Discharge: 10/28/18 Admitting Physician: Alveda Reasons, MD  Primary Care Provider: Levin Erp, MD Consultants: GI  Indication for Hospitalization: Anemia secondary to lower GI bleed  Discharge Diagnoses/Problem List:  Lower GI bleed stercoral ulcer from chronic constipation Constipation Acute urinary retention Chronic left leg pain Hypertension Peripheral vascular disease History of atrial flutter s/p ablation Prolonged QTc Protein-calorie Malnutrition  Disposition: SNF  Discharge Condition: Stable  Discharge Exam: (Per Dr. Tarry Kos on day of discharge) General: Thin elderly Caucasian woman, well nourished, well developed, in no acute distress with non-toxic appearance, sitting up comfortably with meal tray in front of her HEENT: normocephalic, atraumatic, moist mucous membranes Neck: supple, normal ROM CV: regular rate, irreegular rhythm with loud systolic murmur, bilateral UE swelling (improved from day prior), no LE edema, 2+ radial and pedal pulses bilaterally   Lungs: clear to auscultation bilaterally with normal work of breathing Abdomen: soft, non-tender, mildly distended, normoactive bowel sounds Skin: warm, dry Extremities: warm and well perfused Neuro: Alert and orientedx2, more linear speech, answered all questions appropriately this AM  Brief Hospital Course:  Michelle Gaines is a 85 y.o. female with past medical history significant for atrial flutter s/p ablation, chronic left leg pain, hypertension, PVD, and falls, who presented with one day history of bright red blood per rectum likely lower GI bleed secondary to stercoral ulcer from chronic constipation. Abdominal x-ray significant for large stool burden in rectosigmoid colon without evidence  of bowel obstruction. GI was consulted and recommend aggressive laxatives to clear impaction. Patient tolerated this well and her bleeding improved. She had no further rectal bleeding for >72 hours.   On hospital day 3, patient was noted to have spike in WBC to 25.3 with no signs of infection. Smear was obtained with showed leukocytosis with neutrophil predominance.  Leukocytosis slowly began to normalize prior to discharge and was believed to be more reactive rather than infectious.    On 10/27/2018 patient developed acute urinary retention and had an in and out cath with 934 mL's removed.  Patient was monitored with bladder scans throughout the day and had second in and out cath removing 394 MLS from bladder.  Patient then was noted to have urine in her bedside commode and subsequent bladder scans revealed less than 300 cc.  Recommend maximum urine volume of bladder be <400cc.  Patient also developed mild symptoms of hospital delirium which waxed and waned during stay.  By day of discharge patient's symptoms were improved and she was tolerating PO. PT/OT were consulted and recommended SNF.  Issues for Follow Up:  1. Recommend daily bowel regimen to prevent further constipation episodes  2. Noted to have prolonged QTc of 527 on Admission. Consider repeat EKG for resolution. Avoid QT prolonging agents.  3. Recommend strict intake and output.  Recommend maximum urine volume of bladder be around <400cc.  May require in and out cath periodically if not recording good intake and outputs. 4. Recommend delirium precautions be in place, as she is redirectable.  Significant Procedures: none  Significant Labs and Imaging:  Recent Labs  Lab 10/25/18 0346 10/25/18 2230 10/26/18 0357 10/27/18 0306  WBC 23.6*  --  19.4* 14.7*  HGB 7.2* 9.7* 9.3* 8.3*  HCT 21.3* 28.4* 27.1* 24.6*  PLT 108*  --  100* 106*   Recent  Labs  Lab 10/22/18 2054 10/23/18 0710 10/24/18 0254 10/25/18 0346 10/26/18 0357  10/27/18 0306 10/28/18 0930  NA 142 142 142 139 134* 136 138  K 3.1* 3.6 3.9 3.9 4.2 3.8 3.6  CL 103 106 106 104 96* 104 106  CO2 27 28 25 23 26 24 25   GLUCOSE 107* 106* 133* 94 89 95 120*  BUN 33* 32* 38* 47* 49* 42* 27*  CREATININE 1.16* 0.94 1.09* 1.22* 1.28* 1.03* 0.93  CALCIUM 9.1 8.5* 9.0 9.0 9.3 8.3* 8.4*  MG  --  2.0  --   --   --   --   --   ALKPHOS 118  --   --   --   --   --   --   AST 37  --   --   --   --   --   --   ALT 23  --   --   --   --   --   --   ALBUMIN 3.4*  --   --   --   --   --   --     FOBT: positive Mag: 2.0  Dg Abd Acute W/chest  Result Date: 10/22/2018 CLINICAL DATA:  Hematochezia beginning today. EXAM: DG ABDOMEN ACUTE W/ 1V CHEST COMPARISON:  05/20/2013 FINDINGS: Cardiomegaly is increased since previous study. Aortic atherosclerosis. Elevation of left hemidiaphragm is seen. Mild left pleural thickening versus small effusion. No evidence of pulmonary infiltrate or edema. Pulmonary hyperinflation is consistent with COPD. Large amount of stool is seen in the rectosigmoid colon. No evidence of dilated bowel loops or free intraperitoneal air. Extensive aortoiliac atherosclerotic calcification noted. IMPRESSION: 1. Large amount of stool in rectosigmoid colon. No evidence of bowel obstruction or free air. 2. Cardiomegaly and COPD. 3. Elevated left hemidiaphragm. Mild left pleural thickening versus small effusion. Electronically Signed   By: Earle Gell M.D.   On: 10/22/2018 22:34   Results/Tests Pending at Time of Discharge: none  Discharge Medications:  Allergies as of 10/28/2018      Reactions   Fexofenadine Shortness Of Breath   Cephalosporins Rash   Diltiazem Other (See Comments)   Dizziness   Penicillins Nausea And Vomiting, Swelling, Rash   Amlodipine Other (See Comments)   Anaprox [naproxen Sodium] Other (See Comments)   Benzonatate Other (See Comments)   Cephalexin Other (See Comments)   Clindamycin/lincomycin Other (See Comments)   Clorazepate  Other (See Comments)   Codeine Other (See Comments)   Doxycycline Other (See Comments)   Hydralazine Other (See Comments)   Labetalol Other (See Comments)   Lisinopril Other (See Comments)   Phenylephrine Other (See Comments)   Phenytoin Other (See Comments)   Zolpidem Other (See Comments)      Medication List    STOP taking these medications   furosemide 20 MG tablet Commonly known as:  LASIX   Lutein 20 MG Tabs   magnesium gluconate 500 MG tablet Commonly known as:  MAGONATE   Potassium 99 MG Tabs     TAKE these medications   cholecalciferol 25 MCG (1000 UT) tablet Commonly known as:  VITAMIN D3 Take 1,000 Units by mouth daily.   diclofenac sodium 1 % Gel Commonly known as:  VOLTAREN Apply 2 g topically 4 (four) times daily.   losartan 50 MG tablet Commonly known as:  COZAAR Take 50 mg by mouth daily.   multivitamin per tablet Take 1 tablet by mouth daily.   nitroGLYCERIN 0.4 MG SL  tablet Commonly known as:  NITROSTAT Place 1 tablet (0.4 mg total) under the tongue every 5 (five) minutes as needed for chest pain.   polyethylene glycol 17 g packet Commonly known as:  MIRALAX / GLYCOLAX Take 17 g by mouth 2 (two) times daily.   senna 8.6 MG Tabs tablet Commonly known as:  SENOKOT Take 1 tablet (8.6 mg total) by mouth daily. Start taking on:  Oct 29, 2018       Discharge Instructions: Please refer to Patient Instructions section of EMR for full details.  Patient was counseled important signs and symptoms that should prompt return to medical care, changes in medications, dietary instructions, activity restrictions, and follow up appointments.   Follow-Up Appointments:   Kamarion Zagami, [place], DO 10/28/2018, 12:46 PM PGY-2, [place]

## 2018-10-24 NOTE — Progress Notes (Addendum)
Family Medicine Teaching Service Daily Progress Note Intern Pager: [phone]  Patient name: Michelle Gaines record number: [id number] Date of birth: [birth year] Age: 85 y.o. Gender: female  Primary Care Provider: Levin Erp, MD Consultants: GI Code Status: DNR  Pt Overview and Major Events to Date:  4/30 - admitted to [facility], Abdominal x-ray: Large amount of stool in rectosigmoid colon. No evidence of bowel obstruction or free air.  Assessment and Plan: Michelle Gaines is a 85 y.o. female presenting with a 1 day history of GI bleeding likely secondary to internal hemorrhoids or diverticulosis.  Patient is hemodynamically stable with a hemoglobin over 11 patient also has chronic left leg pain with no swelling.Marland Kitchen PMH is significant for atrial flutter, hypertension, PVD, falls  Lower GI Bleed  Anemia d/t Acute Bleed  Constipation  Most likely ddx includes stercoral ulcer from chronic and severe impaction. Hgb improved overnight 8>7.7>7.9>7.2.  Aggressive bowel regimen overnight including tap water enema. ~465mL of stool output. No blood per rectum since last night per nurse. Stable vitals overnight. Patient appears better this morning. Eating breakfast this AM and tolerating PO well. Denies any chest pain or SOB. - Transfuse if <7 - Continue bowel regimen until less stool output and no further bleeding - monitor Hgb (PM and AM CBC) - Regular diet - monitor for Chest pain - obtain EKG, trend trops, call cards  - Consult GI if bleeding recurs/persists - Per GI: Do not recommend endoscopic procedures given age and comorbidities. Recommend flexible sigmoidoscopy if bleeding persists.  - consider IV iron if Hgb remains >7 but patient still symptomatic given acute drop in Hgb  Constipation: improving - See plan above  Left leg pain  History of Falls: This is a chronic issue for this patient. Unclear etiology, patient difficult historian when it comes to describing pain. Likely 2/2 to  dementia. Did not complain of pain this AM however paged by nurse endorsing severe pain without improvement with tylenol. On exam, nontender to palpation, no erythema or edema bilaterally. Neurovascularly intact. - Tylenol for pain - Voltaren gel  - heating pad - PT/OT consulted, recommend SN. CSW working on SNF placement to [employer] Son Remo Lipps) aware and agrees to plan.  - continue to moniitor  Hypertension:  Stable BP overnight, 156/76 this AM. Home meds: Losartan 50mg  and Lasix 20mg . - continue home Losartan 50mg  QD, stop if BP drops  - hold lasix   Peripheral Vascular Disease: History of peripheral vascular disease with external iliac to profunda bypass in 2009. Appointment with vascular surgery on  01/14/2018 showed symmetric normal ABI on the left. Also had work up for DVT in 12/13/2017 which was negative bilaterally. Home meds; Aspirin  - hold home aspirin due to active bleed  History of atrial flutter s/p Ablation: Followed by cardiology. Had atrial flutter ablation in 2014. Patient has been refusing systemic anticoagulation per cards note. Patient is elderly and a fall risk as well. Patient also not on BB's due to intolerance. Remains asymptomatic. Denies any CP or SOB. - continue to monitor for chest pain  - obtain repeat EKG, troponins, and call cardiology if occurs given acute bleed - recommend follow up with cardiology outpatient  Prolonged QTc: EKG significant for QTc of 527. - Avoid QT prolonging meds   Hypokalemia: resolved K 3.9 this AM - continue to monitor   Protein-Calorie Malnutrition: Patient cachectic and frail.  - Nutrition consulted, recommend Ensure Enlive BID, MVI daily  FEN/GI: Regular diet, Ensure Jeanne Ivan  BID PPx: SCDs  Disposition: Pending improvement  Subjective:  Patient doing a little bit better this morning, less weak.  Endorses some mild abdominal pain when palpated otherwise denies any pain.  Denies any lower extremity pain morning.   Notes she has been "messing on herself all night".  Eating breakfast during exam and tolerating well.  Objective: Temp:  [97.9 F (36.6 C)-99.1 F (37.3 C)] 98 F (36.7 C) (05/01 1029) Pulse Rate:  [72-102] 93 (05/01 1029) Resp:  [16-20] 18 (05/01 1029) BP: (135-158)/(76-81) 147/81 (05/01 1029) SpO2:  [97 %-100 %] 99 % (05/01 1029) Weight:  [38.1 kg] 38.1 kg (05/01 0858) Physical Exam: General: An elderly woman, well nourished, well developed, in no acute distress with non-toxic appearance, lying comfortably in hospital bed HEENT: normocephalic, atraumatic Neck: supple, normal ROM CV: Systolic murmur, no lower extremity edema, 2+ radial and pedal pulses bilaterally Lungs: clear to auscultation bilaterally with normal work of breathing Abdomen: soft, mildly tender to deep palpation, non-distended, normoactive bowel sounds Skin: warm, dry, no rashes or lesions Extremities: warm and well perfused, calf is nontender laterally, no edema Neuro: Alert, answers only some questions appropriately, follows simple commands, speech normal, demonstrating short term memory  Laboratory: Recent Labs  Lab 10/23/18 1041 10/23/18 1656 10/24/18 0906  WBC 9.7 14.9* 22.5*  HGB 7.7* 7.9* 7.2*  HCT 23.8* 24.5* 22.7*  PLT 132* 138* 120*   Recent Labs  Lab 10/22/18 2054 10/23/18 0710 10/24/18 0254  NA 142 142 142  K 3.1* 3.6 3.9  CL 103 106 106  CO2 27 28 25   BUN 33* 32* 38*  CREATININE 1.16* 0.94 1.09*  CALCIUM 9.1 8.5* 9.0  PROT 6.3*  --   --   BILITOT 0.7  --   --   ALKPHOS 118  --   --   ALT 23  --   --   AST 37  --   --   GLUCOSE 107* 106* 133*    Mag: 2.0 FOBT: positive  Imaging/Diagnostic Tests: Dg Abd Acute W/chest  Result Date: 10/22/2018 CLINICAL DATA:  Hematochezia beginning today. EXAM: DG ABDOMEN ACUTE W/ 1V CHEST COMPARISON:  05/20/2013 FINDINGS: Cardiomegaly is increased since previous study. Aortic atherosclerosis. Elevation of left hemidiaphragm is seen. Mild left  pleural thickening versus small effusion. No evidence of pulmonary infiltrate or edema. Pulmonary hyperinflation is consistent with COPD. Large amount of stool is seen in the rectosigmoid colon. No evidence of dilated bowel loops or free intraperitoneal air. Extensive aortoiliac atherosclerotic calcification noted. IMPRESSION: 1. Large amount of stool in rectosigmoid colon. No evidence of bowel obstruction or free air. 2. Cardiomegaly and COPD. 3. Elevated left hemidiaphragm. Mild left pleural thickening versus small effusion. Electronically Signed   By: Earle Gell M.D.   On: 10/22/2018 22:34    Danna Hefty, DO 10/24/2018, 2:10 PM PGY-1, [place] Intern pager: [phone], text pages welcome

## 2018-10-24 NOTE — Progress Notes (Signed)
Physical Therapy Treatment Patient Details Name: Michelle Gaines MRN: [id number] DOB: 02-17-1929 Today's Date: 10/24/2018    History of Present Illness Pt is 85 y.o. female presenting with 1 day history of GI bleeding. PMH includes chronic constipation, atrial flutter, hypertension, PVD, falls.     PT Comments    Patient progressing with therapy today, now standing EOB several times with hand held assist, min A. Too weak to side step and ambulate, returns to bed. Cont to rec SNF although patient states she wants to go home.    Follow Up Recommendations  SNF     Equipment Recommendations  (TBD)    Recommendations for Other Services       Precautions / Restrictions Precautions Precautions: Fall Restrictions Weight Bearing Restrictions: No    Mobility  Bed Mobility Overal bed mobility: Needs Assistance Bed Mobility: Supine to Sit;Sit to Supine     Supine to sit: Min assist;HOB elevated Sit to supine: Min assist;HOB elevated   General bed mobility comments: Pt using bed rail to pull herself toward EOB. Therapist assisting to scoot hips EOB.   Transfers                    Ambulation/Gait                 Stairs             Wheelchair Mobility    Modified Rankin (Stroke Patients Only)       Balance Overall balance assessment: Needs assistance   Sitting balance-Leahy Scale: Fair       Standing balance-Leahy Scale: Poor                              Cognition Arousal/Alertness: Awake/alert Behavior During Therapy: WFL for tasks assessed/performed Overall Cognitive Status: No family/caregiver present to determine baseline cognitive functioning Area of Impairment: Problem solving;Following commands                       Following Commands: Follows one step commands with increased time;Follows one step commands consistently     Problem Solving: Slow processing        Exercises      General Comments        Pertinent Vitals/Pain Faces Pain Scale: Hurts even more Pain Location: left leg Pain Descriptors / Indicators: Aching;Discomfort    Home Living                      Prior Function            PT Goals (current goals can now be found in the care plan section) Acute Rehab PT Goals Patient Stated Goal: to go home PT Goal Formulation: With patient Potential to Achieve Goals: Fair    Frequency    Min 3X/week      PT Plan      Co-evaluation              AM-PAC PT "6 Clicks" Mobility   Outcome Measure  Help needed turning from your back to your side while in a flat bed without using bedrails?: A Lot Help needed moving from lying on your back to sitting on the side of a flat bed without using bedrails?: A Lot Help needed moving to and from a bed to a chair (including a wheelchair)?: Total Help needed standing up from a chair using your arms (e.g.,  wheelchair or bedside chair)?: Total Help needed to walk in hospital room?: Total Help needed climbing 3-5 steps with a railing? : Total 6 Click Score: 8    End of Session   Activity Tolerance: Patient limited by fatigue Patient left: in bed   PT Visit Diagnosis: Unsteadiness on feet (R26.81)     Time: 0950-1008 PT Time Calculation (min) (ACUTE ONLY): 18 min  Charges:  $Therapeutic Activity: 8-22 mins                     Reinaldo Berber, PT, DPT Acute Rehabilitation Services Pager: [phone] Office: [place] 10/24/2018, 10:26 AM

## 2018-10-24 NOTE — Progress Notes (Signed)
Patient crying out in pain of L leg despite giving Tylenol. MD notified and made aware. Awaiting orders. Will continue to monitor.

## 2018-10-24 NOTE — Progress Notes (Signed)
Nutrition Follow-up  DOCUMENTATION CODES:   Underweight, Severe malnutrition in context of chronic illness  INTERVENTION:    Increase Ensure Enlive po TID, each supplement provides 350 kcal and 20 grams of protein  Continue MVI daily  NUTRITION DIAGNOSIS:   Severe Malnutrition related to chronic illness(recurrent constipation) as evidenced by severe muscle depletion, severe fat depletion.  Ongoing  GOAL:   Patient will meet greater than or equal to 90% of their needs  Progressing  MONITOR:   PO intake, Supplement acceptance, Labs, Skin, I & O's, Weight trends  REASON FOR ASSESSMENT:   Consult Assessment of nutrition requirement/status  ASSESSMENT:   Patient with PMH significant for GERD, severe protein calorie malnutrition, essential HTN, PVD, and coronary arthrosclerosis. Presents this admission with lower GI bleed.    Attempted to speak with pt at bedside. Pt seemed confused and was unable to provide nutrition history. RD observed Ensure 75% completed. Spoke with RN who reports pt ate 100% of breakfast this am. Plan to increase Ensure as pt looks to be malnourished.   Unsure if admission weight of 84 lb is stated or an actual recording. Will monitor trends.   Nutrition-Focused physical exam completed.   Medications: Vit D, MVI with minerals, miralax Labs: CBG 106-133   NUTRITION - FOCUSED PHYSICAL EXAM:    Most Recent Value  Orbital Region  Severe depletion  Upper Arm Region  Severe depletion  Thoracic and Lumbar Region  Unable to assess  Buccal Region  Severe depletion  Temple Region  Severe depletion  Clavicle Bone Region  Severe depletion  Clavicle and Acromion Bone Region  Severe depletion  Scapular Bone Region  Unable to assess  Dorsal Hand  Severe depletion  Patellar Region  Severe depletion  Anterior Thigh Region  Severe depletion  Posterior Calf Region  Severe depletion  Edema (RD Assessment)  None  Hair  Reviewed  Eyes  Reviewed  Mouth   Reviewed  Skin  Reviewed  Nails  Reviewed       Diet Order:   Diet Order            Diet regular Room service appropriate? Yes; Fluid consistency: Thin  Diet effective now              EDUCATION NEEDS:   Not appropriate for education at this time  Skin:  Skin Assessment: Skin Integrity Issues: Skin Integrity Issues:: Other (Comment), Stage II Stage II: sacrum Other: non-pressure right pretibial  Last BM:  5/1  Height:   Ht Readings from Last 1 Encounters:  10/24/18 5\' 2"  (1.575 m)    Weight:   Wt Readings from Last 1 Encounters:  10/24/18 38.1 kg    Ideal Body Weight:  50 kg  BMI:  Body mass index is 15.36 kg/m.  Estimated Nutritional Needs:   Kcal:  1250-1450 kcal  Protein:  65-80 grams  Fluid:  >/= 1.3 L/day    Mariana Single RD, LDN Clinical Nutrition Pager # - [phone]

## 2018-10-24 NOTE — NC FL2 (Signed)
[place] LEVEL OF CARE SCREENING TOOL     IDENTIFICATION  Patient Name: Michelle Gaines Birthdate: 01-03-1929 Sex: female Admission Date (Current Location): 10/22/2018  [facility] and [place] Number:  Herbalist and Address:  [facility], [place] [address], [place], [place] [address]      Provider Number: [id number]  Attending Physician Name and Address:  Alveda Reasons, MD  Relative Name and Phone Number:  Richardson Landry (son) [phone]    Current Level of Care: Hospital Recommended Level of Care: [place] Prior Approval Number:    Date Approved/Denied:   PASRR Number: [id number]  Discharge Plan: SNF    Current Diagnoses: Patient Active Problem List   Diagnosis Date Noted  . GI bleed 10/23/2018  . Pressure injury of skin 10/23/2018  . Fecal impaction ([place])   . Rectal bleeding   . Ganglion cyst of dorsum of right wrist 07/18/2017  . Closed nondisplaced fracture of proximal phalanx of right great toe 07/18/2017  . Ankle fracture 10/13/2015  . Closed left ankle fracture 10/12/2015  . GERD (gastroesophageal reflux disease) 10/12/2015  . Anxiety 10/12/2015  . Fall   . Hip fracture ([place]) 07/28/2014  . Protein-calorie malnutrition, severe ([place]) 07/28/2014  . Dizziness 04/24/2012  . Restless leg 03/15/2011  . Atrial flutter ([place]) 08/17/2010  . Essential hypertension 08/16/2010  . Coronary atherosclerosis 08/16/2010  . PVD 08/16/2010    Orientation RESPIRATION BLADDER Height & Weight     Self, Situation  Normal Incontinent, External catheter Weight: 83 lb 15.9 oz (38.1 kg) Height:  5\' 2"  (157.5 cm)  BEHAVIORAL SYMPTOMS/MOOD NEUROLOGICAL BOWEL NUTRITION STATUS      Continent Diet(see discharge summary)  AMBULATORY STATUS COMMUNICATION OF NEEDS Skin   Extensive Assist Verbally PU Stage and Appropriate Care, Other (Comment)(pressure injury stage 2 on sacrum, right lower pretibial open wound/incision)                        Personal Care Assistance Level of Assistance  Bathing, Feeding, Dressing, Total care Bathing Assistance: Maximum assistance Feeding assistance: Independent Dressing Assistance: Maximum assistance Total Care Assistance: Maximum assistance   Functional Limitations Info  Sight, Hearing, Speech Sight Info: Adequate Hearing Info: Adequate Speech Info: Adequate    SPECIAL CARE FACTORS FREQUENCY  PT (By licensed PT), OT (By licensed OT)     PT Frequency: min 5x weekly OT Frequency: min 5x weekly            Contractures Contractures Info: Not present    Additional Factors Info  Code Status, Allergies Code Status Info: DNR Allergies Info: Fexofenadine, Cephalosporins, Diltiazem, Penicillins, Amlodipine, Anaprox (naproxen Sodium), Benzonatate, Cephalexin, Clindamycin/lincomycin, Clorzepate, Codeine, Doxycycline, Hydralazine, Labetalol, Lisinopril, Phenylephrine, Phenytoin, Zolpidem           Current Medications (10/24/2018):  This is the current hospital active medication list Current Facility-Administered Medications  Medication Dose Route Frequency Provider Last Rate Last Dose  . 0.9 %  sodium chloride infusion  250 mL Intravenous PRN Benay Pike, MD      . acetaminophen (TYLENOL) tablet 650 mg  650 mg Oral Q6H PRN Benay Pike, MD   650 mg at 10/24/18 8101   Or  . acetaminophen (TYLENOL) suppository 650 mg  650 mg Rectal Q6H PRN Benay Pike, MD      . cholecalciferol (VITAMIN D3) tablet 1,000 Units  1,000 Units Oral Daily Benay Pike, MD   1,000 Units at  10/24/18 0825  . feeding supplement (ENSURE ENLIVE) (ENSURE ENLIVE) liquid 237 mL  237 mL Oral BID BM Alveda Reasons, MD   237 mL at 10/24/18 0825  . losartan (COZAAR) tablet 50 mg  50 mg Oral Daily Benay Pike, MD   50 mg at 10/24/18 0825  . multivitamin with minerals tablet 1 tablet  1 tablet Oral Daily Benay Pike, MD   1 tablet at 10/24/18 0825  . polyethylene glycol powder  (GLYCOLAX/MIRALAX) container 255 g  1 Container Oral Once Wilford Corner, MD      . sodium chloride flush (NS) 0.9 % injection 3 mL  3 mL Intravenous Q12H Benay Pike, MD   3 mL at 10/23/18 2235  . sodium chloride flush (NS) 0.9 % injection 3 mL  3 mL Intravenous PRN Benay Pike, MD   3 mL at 10/24/18 9977     Discharge Medications: Please see discharge summary for a list of discharge medications.  Relevant Imaging Results:  Relevant Lab Results:   Additional Information SSN: [id number]  Alberteen Sam, LCSW

## 2018-10-24 NOTE — Progress Notes (Signed)
Occupational Therapy Treatment Patient Details Name: Michelle Gaines MRN: [id number] DOB: [birth year] Today's Date: 10/24/2018    History of present illness Pt is 85 y.o. female presenting with 1 day history of GI bleeding. PMH includes chronic constipation, atrial flutter, hypertension, PVD, falls.    OT comments  Pt performing ADL functional transfers with maxA +1 for stand pivot transfer and pt anxious about any movement OOB. LLE pain and RN in room to give pain meds. Pt performing light ADL at bed level. Pt maxA for toilet hygiene. Pt progressing and would benefit from continued OT skilled services for ADL, mobility and safety in SNF setting. Pt refused to transfer to recliner as she said "that chair hurts my legs." OT to follow acutely.       Follow Up Recommendations  SNF;Supervision/Assistance - 24 hour    Equipment Recommendations  Other (comment)(to be determined)    Recommendations for Other Services PT consult    Precautions / Restrictions Precautions Precautions: Fall Restrictions Weight Bearing Restrictions: No       Mobility Bed Mobility Overal bed mobility: Needs Assistance Bed Mobility: Supine to Sit;Sit to Supine     Supine to sit: Min assist;HOB elevated Sit to supine: Min assist;HOB elevated   General bed mobility comments: Pt using bed rail to pull herself toward EOB. Therapist assisting to scoot hips EOB.   Transfers Overall transfer level: Needs assistance Equipment used: Rolling walker (2 wheeled) Transfers: Sit to/from [facility] Sit to Stand: Max assist Stand pivot transfers: Max assist;From elevated surface       General transfer comment: maxA +1 for transfers    Balance Overall balance assessment: Needs assistance Sitting-balance support: Bilateral upper extremity supported Sitting balance-Leahy Scale: Fair     Standing balance support: Bilateral upper extremity supported Standing balance-Leahy Scale: Poor                             ADL either performed or assessed with clinical judgement   ADL Overall ADL's : Needs assistance/impaired Eating/Feeding: Supervision/ safety;Set up;Sitting                                   Functional mobility during ADLs: Min guard;Rolling walker;Cueing for safety General ADL Comments: Pt transfered to [facility] and very anxious about movement OOB     Vision   Vision Assessment?: No apparent visual deficits   Perception     Praxis      Cognition Arousal/Alertness: Awake/alert Behavior During Therapy: WFL for tasks assessed/performed Overall Cognitive Status: No family/caregiver present to determine baseline cognitive functioning Area of Impairment: Problem solving;Following commands                       Following Commands: Follows one step commands with increased time;Follows one step commands consistently     Problem Solving: Slow processing          Exercises     Shoulder Instructions       General Comments      Pertinent Vitals/ Pain       Pain Assessment: Faces Faces Pain Scale: Hurts even more Pain Location: left leg Pain Descriptors / Indicators: Aching;Discomfort  Home Living  Prior Functioning/Environment              Frequency  Min 2X/week        Progress Toward Goals  OT Goals(current goals can now be found in the care plan section)  Progress towards OT goals: Progressing toward goals  Acute Rehab OT Goals Patient Stated Goal: to go home OT Goal Formulation: With patient Time For Goal Achievement: 11/06/18 Potential to Achieve Goals: Fair ADL Goals Pt Will Perform Upper Body Bathing: with supervision;sitting Pt Will Perform Lower Body Bathing: with supervision;sit to/from stand Pt Will Perform Upper Body Dressing: with supervision;sitting Pt Will Perform Lower Body Dressing: with supervision;sit to/from stand Pt Will  Transfer to Toilet: with supervision;bedside commode Pt Will Perform Toileting - Clothing Manipulation and hygiene: with supervision;sit to/from stand  Plan Discharge plan remains appropriate    Co-evaluation                 AM-PAC OT "6 Clicks" Daily Activity     Outcome Measure   Help from another person eating meals?: None Help from another person taking care of personal grooming?: A Little Help from another person toileting, which includes using toliet, bedpan, or urinal?: A Lot Help from another person bathing (including washing, rinsing, drying)?: A Lot Help from another person to put on and taking off regular upper body clothing?: A Little Help from another person to put on and taking off regular lower body clothing?: A Lot 6 Click Score: 16    End of Session Equipment Utilized During Treatment: Gait belt;Rolling walker  OT Visit Diagnosis: Unsteadiness on feet (R26.81);Muscle weakness (generalized) (M62.81);History of falling (Z91.81);Pain Pain - Right/Left: Left Pain - part of body: Leg   Activity Tolerance Patient limited by pain;Patient limited by fatigue   Patient Left in bed;with call bell/phone within reach;with bed alarm set   Nurse Communication Mobility status        Time: 9450-3888 OT Time Calculation (min): 45 min  Charges: OT General Charges $OT Visit: 1 Visit OT Treatments $Self Care/Home Management : 23-37 mins $Therapeutic Activity: 8-22 mins  Darryl Nestle) Marsa Aris OTR/L Acute Rehabilitation Services Pager: [phone] Office: [phone]    Michelle Gaines 10/24/2018, 12:41 PM

## 2018-10-24 NOTE — Progress Notes (Addendum)
FPTS Interim Progress Note  S: Wanted to evaluate patient for continued leg pain.  Patient was sitting comfortably in bed eating her meal.  When asked how she was doing states that she was doing fine.  Patient only complaining of leg pain when prompted and states that it is her normal pain that she has at home.  States that it hurts on the bottom of her foot and goes all the way up to her hip.  She has a heating pad on and said that her heating pad should be turned up or larger in order to help with her pain.  States that she uses BenGay at home for her pain.  O: BP (!) 147/81 (BP Location: Left Arm)   Pulse 93   Temp 98 F (36.7 C) (Oral)   Resp 18   Ht 5\' 2"  (1.575 m)   Wt 38.1 kg   SpO2 99%   BMI 15.36 kg/m   Left lower Extremity: Barrier gauze on heel.  2+ pedal pulses.  No cyanosis.  No lower extremity edema.  Left foot warm and well-perfused.  Strength 4/5 with normal range of motion.  A/P: Asked if nursing could obtain larger heating pad for patient as she requested.  We will also try to get her BenGay cream but if unable will use Voltaren gel.  Per patient appears to be consistent with her chronic pain she experiences at home in the same leg.  No red flag symptoms.  Riggs Dineen, [place], DO 10/24/2018, 12:33PM PGY-2, [place] Medicine Service pager [phone]

## 2018-10-24 NOTE — Progress Notes (Signed)
CSW lvm with patient's son Michelle Gaines to consult regarding SNF recommendation and discharge planning.   Awaiting call back. CSW will continue to follow.   [place], [place]

## 2018-10-25 DIAGNOSIS — K921 Melena: Secondary | ICD-10-CM | POA: Diagnosis present

## 2018-10-25 DIAGNOSIS — E876 Hypokalemia: Secondary | ICD-10-CM | POA: Diagnosis present

## 2018-10-25 DIAGNOSIS — Z681 Body mass index (BMI) 19 or less, adult: Secondary | ICD-10-CM | POA: Diagnosis not present

## 2018-10-25 DIAGNOSIS — K625 Hemorrhage of anus and rectum: Secondary | ICD-10-CM | POA: Diagnosis not present

## 2018-10-25 DIAGNOSIS — R330 Drug induced retention of urine: Secondary | ICD-10-CM | POA: Diagnosis not present

## 2018-10-25 DIAGNOSIS — Z66 Do not resuscitate: Secondary | ICD-10-CM | POA: Diagnosis present

## 2018-10-25 DIAGNOSIS — R338 Other retention of urine: Secondary | ICD-10-CM | POA: Diagnosis not present

## 2018-10-25 DIAGNOSIS — L89152 Pressure ulcer of sacral region, stage 2: Secondary | ICD-10-CM | POA: Diagnosis present

## 2018-10-25 DIAGNOSIS — L8990 Pressure ulcer of unspecified site, unspecified stage: Secondary | ICD-10-CM | POA: Diagnosis not present

## 2018-10-25 DIAGNOSIS — K284 Chronic or unspecified gastrojejunal ulcer with hemorrhage: Secondary | ICD-10-CM | POA: Diagnosis present

## 2018-10-25 DIAGNOSIS — K5641 Fecal impaction: Secondary | ICD-10-CM | POA: Diagnosis present

## 2018-10-25 DIAGNOSIS — E43 Unspecified severe protein-calorie malnutrition: Secondary | ICD-10-CM | POA: Diagnosis present

## 2018-10-25 DIAGNOSIS — G8929 Other chronic pain: Secondary | ICD-10-CM | POA: Diagnosis present

## 2018-10-25 DIAGNOSIS — Z1159 Encounter for screening for other viral diseases: Secondary | ICD-10-CM | POA: Diagnosis not present

## 2018-10-25 DIAGNOSIS — D539 Nutritional anemia, unspecified: Secondary | ICD-10-CM | POA: Diagnosis not present

## 2018-10-25 DIAGNOSIS — Y92239 Unspecified place in hospital as the place of occurrence of the external cause: Secondary | ICD-10-CM | POA: Diagnosis present

## 2018-10-25 DIAGNOSIS — I2510 Atherosclerotic heart disease of native coronary artery without angina pectoris: Secondary | ICD-10-CM | POA: Diagnosis present

## 2018-10-25 DIAGNOSIS — F05 Delirium due to known physiological condition: Secondary | ICD-10-CM | POA: Diagnosis not present

## 2018-10-25 DIAGNOSIS — I10 Essential (primary) hypertension: Secondary | ICD-10-CM | POA: Diagnosis present

## 2018-10-25 DIAGNOSIS — K922 Gastrointestinal hemorrhage, unspecified: Secondary | ICD-10-CM | POA: Diagnosis present

## 2018-10-25 DIAGNOSIS — K644 Residual hemorrhoidal skin tags: Secondary | ICD-10-CM | POA: Diagnosis present

## 2018-10-25 DIAGNOSIS — F0390 Unspecified dementia without behavioral disturbance: Secondary | ICD-10-CM | POA: Diagnosis present

## 2018-10-25 DIAGNOSIS — Z95820 Peripheral vascular angioplasty status with implants and grafts: Secondary | ICD-10-CM | POA: Diagnosis not present

## 2018-10-25 DIAGNOSIS — F0391 Unspecified dementia with behavioral disturbance: Secondary | ICD-10-CM | POA: Diagnosis not present

## 2018-10-25 DIAGNOSIS — I739 Peripheral vascular disease, unspecified: Secondary | ICD-10-CM | POA: Diagnosis present

## 2018-10-25 DIAGNOSIS — R64 Cachexia: Secondary | ICD-10-CM | POA: Diagnosis present

## 2018-10-25 DIAGNOSIS — M79605 Pain in left leg: Secondary | ICD-10-CM | POA: Diagnosis present

## 2018-10-25 DIAGNOSIS — R9431 Abnormal electrocardiogram [ECG] [EKG]: Secondary | ICD-10-CM | POA: Diagnosis present

## 2018-10-25 DIAGNOSIS — D72829 Elevated white blood cell count, unspecified: Secondary | ICD-10-CM | POA: Diagnosis not present

## 2018-10-25 DIAGNOSIS — D62 Acute posthemorrhagic anemia: Secondary | ICD-10-CM | POA: Diagnosis present

## 2018-10-25 DIAGNOSIS — D696 Thrombocytopenia, unspecified: Secondary | ICD-10-CM | POA: Diagnosis not present

## 2018-10-25 LAB — CBC
HCT: 21.3 % — ABNORMAL LOW (ref 36.0–46.0)
Hemoglobin: 7.2 g/dL — ABNORMAL LOW (ref 12.0–15.0)
MCH: 33.2 pg (ref 26.0–34.0)
MCHC: 33.8 g/dL (ref 30.0–36.0)
MCV: 98.2 fL (ref 80.0–100.0)
Platelets: 108 10*3/uL — ABNORMAL LOW (ref 150–400)
RBC: 2.17 MIL/uL — ABNORMAL LOW (ref 3.87–5.11)
RDW: 14.7 % (ref 11.5–15.5)
WBC: 23.6 10*3/uL — ABNORMAL HIGH (ref 4.0–10.5)
nRBC: 0 % (ref 0.0–0.2)

## 2018-10-25 LAB — BASIC METABOLIC PANEL
Anion gap: 12 (ref 5–15)
BUN: 47 mg/dL — ABNORMAL HIGH (ref 8–23)
CO2: 23 mmol/L (ref 22–32)
Calcium: 9 mg/dL (ref 8.9–10.3)
Chloride: 104 mmol/L (ref 98–111)
Creatinine, Ser: 1.22 mg/dL — ABNORMAL HIGH (ref 0.44–1.00)
GFR calc Af Amer: 46 mL/min — ABNORMAL LOW (ref >60)
GFR calc non Af Amer: 40 mL/min — ABNORMAL LOW (ref >60)
Glucose, Bld: 94 mg/dL (ref 70–99)
Potassium: 3.9 mmol/L (ref 3.5–5.1)
Sodium: 139 mmol/L (ref 135–145)

## 2018-10-25 LAB — DIFFERENTIAL
Abs Immature Granulocytes: 0.17 10*3/uL — ABNORMAL HIGH (ref 0.00–0.07)
Basophils Absolute: 0 10*3/uL (ref 0.0–0.1)
Basophils Relative: 0 %
Eosinophils Absolute: 0.1 10*3/uL (ref 0.0–0.5)
Eosinophils Relative: 0 %
Immature Granulocytes: 1 %
Lymphocytes Relative: 2 %
Lymphs Abs: 0.4 10*3/uL — ABNORMAL LOW (ref 0.7–4.0)
Monocytes Absolute: 1 10*3/uL (ref 0.1–1.0)
Monocytes Relative: 5 %
Neutro Abs: 18.8 10*3/uL — ABNORMAL HIGH (ref 1.7–7.7)
Neutrophils Relative %: 92 %

## 2018-10-25 LAB — SARS CORONAVIRUS 2 BY RT PCR (HOSPITAL ORDER, PERFORMED IN ~~LOC~~ HOSPITAL LAB): SARS Coronavirus 2: NEGATIVE

## 2018-10-25 LAB — PREPARE RBC (CROSSMATCH)

## 2018-10-25 LAB — HEMOGLOBIN AND HEMATOCRIT, BLOOD
HCT: 28.4 % — ABNORMAL LOW (ref 36.0–46.0)
Hemoglobin: 9.7 g/dL — ABNORMAL LOW (ref 12.0–15.0)

## 2018-10-25 LAB — TROPONIN I: Troponin I: 0.03 ng/mL (ref <0.03)

## 2018-10-25 MED ORDER — SODIUM CHLORIDE 0.9% IV SOLUTION
Freq: Once | INTRAVENOUS | Status: AC
  Administered 2018-10-25: 18:00:00 via INTRAVENOUS

## 2018-10-25 NOTE — TOC Progression Note (Signed)
Transition of Care [facility]) - Progression Note    Patient Details  Name: Michelle Gaines MRN: [id number] Date of Birth: 01/09/1929  Transition of Care [facility]) CM/SW [place], [place] Phone Number: 10/25/2018, 9:17 AM  Clinical Narrative:     CSW called and spoke with the patient's son, Michelle Gaines. CSW explained that [facility] was not able to take his mother at this time. CSW asked if there were any other facilities that he would like his mother to go to. He stated that his grandmother had been at [facility]. CSW explained that [facility] is taking new admits but the patient must have a negative COVID screen. Mr. West Pugh gave permission for his mother to have a COVID screen. CSW asked if there were any other facilities that she could fax the patient out too, he stated that he would like to see if his mother could get into [place]. CSW obtained Mr. West Pugh email address to send the CMS list too. He or his wife would call back with other options.   CSW emailed the CMS list to the patient's son and placed a hard copy on the patient's chart.   Expected Discharge Plan: [place] Barriers to Discharge: Continued Medical Work up  Expected Discharge Plan and Services Expected Discharge Plan: [place]   Discharge Planning Services: NA Post Acute Care Choice: [place] Living arrangements for the past 2 months: Single Family Home Expected Discharge Date: 10/24/18               DME Arranged: N/A DME Agency: NA       HH Arranged: NA HH Agency: NA         Social Determinants of Health (SDOH) Interventions    Readmission Risk Interventions No flowsheet data found.

## 2018-10-25 NOTE — Progress Notes (Signed)
Physical Therapy Treatment Patient Details Name: Michelle Gaines MRN: [id number] DOB: 1928/12/17 Today's Date: 10/25/2018    History of Present Illness Pt is 85 y.o. female presenting with 1 day history of GI bleeding. PMH includes chronic constipation, atrial flutter, hypertension, PVD, falls.     PT Comments    patient progressing today with therapy, now tolerating transfer to bedside chair with mod A. Although patient wishes to return home, do not feel she is there yet from mobility standpoint. Will cont to follow. Cont to rec SNF at this time.    Follow Up Recommendations  SNF     Equipment Recommendations  (TBD)    Recommendations for Other Services       Precautions / Restrictions Precautions Precautions: Fall Restrictions Weight Bearing Restrictions: No    Mobility  Bed Mobility Overal bed mobility: Needs Assistance Bed Mobility: Supine to Sit;Sit to Supine     Supine to sit: Min assist;HOB elevated Sit to supine: Min assist;HOB elevated   General bed mobility comments: Pt using bed rail to pull herself toward EOB. Therapist assisting to scoot hips EOB.   Transfers Overall transfer level: Needs assistance Equipment used: 1 person hand held assist Transfers: Sit to/from [facility] Sit to Stand: Mod assist Stand pivot transfers: From elevated surface;Min assist       General transfer comment: modA +1 for transfers  Ambulation/Gait                 Stairs             Wheelchair Mobility    Modified Rankin (Stroke Patients Only)       Balance Overall balance assessment: Needs assistance Sitting-balance support: Bilateral upper extremity supported Sitting balance-Leahy Scale: Fair     Standing balance support: Bilateral upper extremity supported Standing balance-Leahy Scale: Poor                              Cognition Arousal/Alertness: Awake/alert Behavior During Therapy: WFL for tasks  assessed/performed Overall Cognitive Status: No family/caregiver present to determine baseline cognitive functioning Area of Impairment: Problem solving;Following commands                       Following Commands: Follows one step commands with increased time;Follows one step commands consistently     Problem Solving: Slow processing        Exercises      General Comments        Pertinent Vitals/Pain Faces Pain Scale: Hurts even more Pain Location: left leg Pain Descriptors / Indicators: Aching;Discomfort    Home Living                      Prior Function            PT Goals (current goals can now be found in the care plan section) Acute Rehab PT Goals Patient Stated Goal: to go home PT Goal Formulation: With patient Potential to Achieve Goals: Fair Progress towards PT goals: Progressing toward goals    Frequency    Min 3X/week      PT Plan Current plan remains appropriate    Co-evaluation              AM-PAC PT "6 Clicks" Mobility   Outcome Measure  Help needed turning from your back to your side while in a flat bed without using bedrails?: A Lot Help  needed moving from lying on your back to sitting on the side of a flat bed without using bedrails?: A Lot Help needed moving to and from a bed to a chair (including a wheelchair)?: Total Help needed standing up from a chair using your arms (e.g., wheelchair or bedside chair)?: Total Help needed to walk in hospital room?: Total Help needed climbing 3-5 steps with a railing? : Total 6 Click Score: 8    End of Session Equipment Utilized During Treatment: Gait belt Activity Tolerance: Patient limited by fatigue   Nurse Communication: Mobility status PT Visit Diagnosis: Unsteadiness on feet (R26.81)     Time: 8485-9276 PT Time Calculation (min) (ACUTE ONLY): 15 min  Charges:  $Therapeutic Exercise: 8-22 mins                     Reinaldo Berber, PT, DPT Acute Rehabilitation  Services Pager: [phone] Office: [place] 10/25/2018, 12:20 PM

## 2018-10-25 NOTE — Progress Notes (Signed)
Family Medicine Teaching Service Daily Progress Note Intern Pager: [phone]  Patient name: Michelle Gaines record number: [id number] Date of birth: December 09, 1928 Age: 85 y.o. Gender: female  Primary Care Provider: Levin Erp, MD Consultants: GI Code Status: DNR  Pt Overview and Major Events to Date:  4/30 - admitted. Abdominal x-ray: Large amount of stool in rectosigmoid colon. No evidence of bowel obstruction or free air.  Assessment and Plan: Michelle H Matthewsis a 85 y.o.femalepresenting with a 1 day history of GI bleeding likely from stercoral ulcer from chronic and severe impaction. PMH is significant foratrial flutter, hypertension, PVD, falls  Anemia 2/2 lower GI bleed  Constipation Most likely 2/2 stercoral ulcer from chronic, severe impaction.  Hemoglobin stable overnight.  8>7.7>7.5>7.2. Aggressive bowel regimen including 2/2 tap water enema.  Nursing reports no bleeding.  Last bowel movement 10/24/2018.  Patient was stable vitals overnight.  Appears better this morning and states that she feels as if she could go.  Patient is amenable to SNF.  Patient has been tolerating p.o. well and drinking plenty of water.  She denies any chest pain or shortness of breath but does endorse some dizziness upon standing. - Will transfuse patient 1 U pRBC's given that patient is symptomatic upon standing.  - Continue bowel regimen until less stool output and no further bleeding - monitor Hgb (am and pm CBC) -GI consulted and has signed off: Do not recommend endoscopic procedures given age and comorbidities. Recommend flexible sigmoidoscopy if bleeding persists.  - consider IV iron if Hgb remains >7 but patient still symptomatic given acute drop in Hgb  Leukocytosis: Patient with WBC to 23.6 this am and peak at 25.7. Unclear etiology, given no signs of infection as patient remains afebrile, has no SOB, lungs clear, no tenderness on abdominal exam, and no wounds on exam. -Obtain  differential, may obtain smear -monitor closely for signs of infection  Left leg pain  History of Falls: Chronic left leg pain. Patient also complaining of left heel pain this morning. Corn present on patient's heel. Likely cause of pain. There is to offload weight from her heel by using pillow. Patient also given tramadol, Tylenol and Voltaren gel. On exam, no erythema, edema, and good pulses. - Tylenol prn and tramadol 50mg  q12 prn for pain - Voltaren gel  - heating pad - PT/OT consulted, recommend SNF. CSW working on SNF placement to [place]. COVID-19 rapid test ordered for SNF placement. - continue to monitor  Hypertension:  Stable BP overnight, 165/60 this a.m. Home meds: Losartan 50mg  and Lasix 20mg . - continue home Losartan 50mg  QD, stop if BP drops  - hold lasix   Peripheral Vascular Disease: History of peripheral vascular disease with external iliac to profunda bypass in 2009. Appointment with vascular surgery on  01/14/2018 showed symmetric normal ABI on the left. Also had work up for DVT in 12/13/2017 which was negative bilaterally. Home meds; Aspirin  - hold home aspirin due to active bleed  History of atrial flutter s/p Ablation: Followed by cardiology. Had atrial flutter ablation in 2014. Patient has been refusing systemic anticoagulation per cards note. Patient is elderly and a fall risk as well. Patient also not on BB's due to intolerance.   Remains asymptomatic.  Denies any chest pain or shortness of breath. - continue to monitor for chest pain  - obtain repeat EKG, troponins, and call cardiology if occurs given acute bleed - recommend follow up with cardiology outpatient  Prolonged QTc: EKG significant for QTc of  527. - Avoid QT prolonging meds   Protein-Calorie Malnutrition: Elderly patient who is cachectic and frail.  - Nutrition consulted, recommend Ensure Enlive BID, MVI daily  FEN/GI: Regular diet, Ensure Enlive  BID PPx: SCDs  Disposition: SNF when  medically cleared  Subjective:  Patient drinking water this am and states that when she gets up to try to use the restroom, she is unable to go easily.   Objective: Temp:  [97.7 F (36.5 C)-98.4 F (36.9 C)] 97.7 F (36.5 C) (05/02 0603) Pulse Rate:  [52-93] 70 (05/02 0603) Resp:  [18-20] 19 (05/02 0603) BP: (116-156)/(67-103) 137/67 (05/02 0603) SpO2:  [93 %-100 %] 98 % (05/02 0603) Weight:  [38.1 kg] 38.1 kg (05/01 0858) Physical Exam: General: NAD, pleasant, elderly, frail Cardiovascular: RRR, no LE edema Respiratory: CTA BL, normal work of breathing Gastrointestinal: soft, nontender, nondistended MSK: corn on left foot with normal range of motion Neuro: CN II-XII grossly intact Psych: AOx3, appropriate affect  Laboratory: Recent Labs  Lab 10/24/18 0906 10/24/18 1534 10/25/18 0346  WBC 22.5* 25.7* 23.6*  HGB 7.2* 7.5* 7.2*  HCT 22.7* 23.2* 21.3*  PLT 120* 124* PENDING   Recent Labs  Lab 10/22/18 2054 10/23/18 0710 10/24/18 0254 10/25/18 0346  NA 142 142 142 139  K 3.1* 3.6 3.9 3.9  CL 103 106 106 104  CO2 27 28 25 23   BUN 33* 32* 38* 47*  CREATININE 1.16* 0.94 1.09* 1.22*  CALCIUM 9.1 8.5* 9.0 9.0  PROT 6.3*  --   --   --   BILITOT 0.7  --   --   --   ALKPHOS 118  --   --   --   ALT 23  --   --   --   AST 37  --   --   --   GLUCOSE 107* 106* 133* 94    Imaging/Diagnostic Tests: Dg Abd Acute W/chest  Result Date: 10/22/2018 CLINICAL DATA:  Hematochezia beginning today. EXAM: DG ABDOMEN ACUTE W/ 1V CHEST COMPARISON:  05/20/2013 FINDINGS: Cardiomegaly is increased since previous study. Aortic atherosclerosis. Elevation of left hemidiaphragm is seen. Mild left pleural thickening versus small effusion. No evidence of pulmonary infiltrate or edema. Pulmonary hyperinflation is consistent with COPD. Large amount of stool is seen in the rectosigmoid colon. No evidence of dilated bowel loops or free intraperitoneal air. Extensive aortoiliac atherosclerotic  calcification noted. IMPRESSION: 1. Large amount of stool in rectosigmoid colon. No evidence of bowel obstruction or free air. 2. Cardiomegaly and COPD. 3. Elevated left hemidiaphragm. Mild left pleural thickening versus small effusion. Electronically Signed   By: Earle Gell M.D.   On: 10/22/2018 22:34    Shan Padgett, [place], DO 10/25/2018, 8:28 AM PGY-2, [place] Intern pager: [phone], text pages welcome

## 2018-10-25 NOTE — Progress Notes (Signed)
CRITICAL VALUE ALERT  Critical Value:  Troponin 0.03  Date & Time Notied:  10/25/2018  Provider Notified:[place], Dellis Filbert

## 2018-10-25 NOTE — Progress Notes (Signed)
Pt took tele off, and is refusing to put it back on. Pt states " I want to die and ya'll just keep prolonging it." Messaged MD on call to make aware.   Eleanora Neighbor, RN

## 2018-10-26 DIAGNOSIS — K922 Gastrointestinal hemorrhage, unspecified: Secondary | ICD-10-CM

## 2018-10-26 DIAGNOSIS — L8990 Pressure ulcer of unspecified site, unspecified stage: Secondary | ICD-10-CM

## 2018-10-26 LAB — TYPE AND SCREEN
ABO/RH(D): O POS
Antibody Screen: NEGATIVE
Unit division: 0

## 2018-10-26 LAB — RETICULOCYTES
Immature Retic Fract: 24.2 % — ABNORMAL HIGH (ref 2.3–15.9)
RBC.: 2.87 MIL/uL — ABNORMAL LOW (ref 3.87–5.11)
Retic Count, Absolute: 73.8 10*3/uL (ref 19.0–186.0)
Retic Ct Pct: 2.6 % (ref 0.4–3.1)

## 2018-10-26 LAB — BASIC METABOLIC PANEL
Anion gap: 12 (ref 5–15)
BUN: 49 mg/dL — ABNORMAL HIGH (ref 8–23)
CO2: 26 mmol/L (ref 22–32)
Calcium: 9.3 mg/dL (ref 8.9–10.3)
Chloride: 96 mmol/L — ABNORMAL LOW (ref 98–111)
Creatinine, Ser: 1.28 mg/dL — ABNORMAL HIGH (ref 0.44–1.00)
GFR calc Af Amer: 43 mL/min — ABNORMAL LOW (ref >60)
GFR calc non Af Amer: 37 mL/min — ABNORMAL LOW (ref >60)
Glucose, Bld: 89 mg/dL (ref 70–99)
Potassium: 4.2 mmol/L (ref 3.5–5.1)
Sodium: 134 mmol/L — ABNORMAL LOW (ref 135–145)

## 2018-10-26 LAB — FOLATE: Folate: 8.8 ng/mL (ref >5.9)

## 2018-10-26 LAB — VITAMIN B12: Vitamin B-12: 1551 pg/mL — ABNORMAL HIGH (ref 180–914)

## 2018-10-26 LAB — BPAM RBC
Blood Product Expiration Date: 202005042359
ISSUE DATE / TIME: 202005021622
Unit Type and Rh: 9500

## 2018-10-26 LAB — CBC WITH DIFFERENTIAL/PLATELET
Abs Immature Granulocytes: 0.14 10*3/uL — ABNORMAL HIGH (ref 0.00–0.07)
Basophils Absolute: 0 10*3/uL (ref 0.0–0.1)
Basophils Relative: 0 %
Eosinophils Absolute: 0 10*3/uL (ref 0.0–0.5)
Eosinophils Relative: 0 %
HCT: 27.1 % — ABNORMAL LOW (ref 36.0–46.0)
Hemoglobin: 9.3 g/dL — ABNORMAL LOW (ref 12.0–15.0)
Immature Granulocytes: 1 %
Lymphocytes Relative: 3 %
Lymphs Abs: 0.5 10*3/uL — ABNORMAL LOW (ref 0.7–4.0)
MCH: 32.4 pg (ref 26.0–34.0)
MCHC: 34.3 g/dL (ref 30.0–36.0)
MCV: 94.4 fL (ref 80.0–100.0)
Monocytes Absolute: 0.9 10*3/uL (ref 0.1–1.0)
Monocytes Relative: 5 %
Neutro Abs: 17.9 10*3/uL — ABNORMAL HIGH (ref 1.7–7.7)
Neutrophils Relative %: 91 %
Platelets: 100 10*3/uL — ABNORMAL LOW (ref 150–400)
RBC: 2.87 MIL/uL — ABNORMAL LOW (ref 3.87–5.11)
RDW: 16.6 % — ABNORMAL HIGH (ref 11.5–15.5)
WBC: 19.4 10*3/uL — ABNORMAL HIGH (ref 4.0–10.5)
nRBC: 0.2 % (ref 0.0–0.2)

## 2018-10-26 LAB — SAVE SMEAR(SSMR), FOR PROVIDER SLIDE REVIEW

## 2018-10-26 LAB — LACTATE DEHYDROGENASE: LDH: 208 U/L — ABNORMAL HIGH (ref 98–192)

## 2018-10-26 MED ORDER — SODIUM CHLORIDE 0.9 % IV SOLN
INTRAVENOUS | Status: AC
  Administered 2018-10-26: 11:00:00 via INTRAVENOUS

## 2018-10-26 NOTE — Progress Notes (Addendum)
Family Medicine Teaching Service Daily Progress Note Intern Pager: [phone]  Patient name: Michelle Gaines record number: [id number] Date of birth: 1928-07-26 Age: 85 y.o. Gender: female  Primary Care Provider: Levin Erp, MD Consultants: GI Code Status: DNR  Pt Overview and Major Events to Date:  4/30 - admitted. Abdominal x-ray: Large amount of stool in rectosigmoid colon. No evidence of bowel obstruction or free air.  Assessment and Plan: Michelle Gaines a 85 y.o.femalepresenting with a 1 day history of GI bleeding likely from stercoral ulcer from chronic and severe impaction. PMH is significant foratrial flutter, hypertension, PVD, falls  Anemia 2/2 lower GI bleed  Constipation Most likely 2/2 stercoral ulcer from chronic, severe impaction undergoing aggressive bowel regimen.  Hemoglobin improved s/p 1U pRBC 7.2>9.7>9.3. MCV 94.4. LDH slightly elevated to 208 but retic count 2.6.  Folate and B12 WNL. Haptoglobin pending. Nursing reports no further bleeding.  Last bowel movement 5/2.  Patient was stable vitals overnight. Patient appears improved, tolerating PO and drinking plenty of water. Denies any CP or SOB.  - S/p 1U pRBC  - monitor Hgb with AM CBC - continue Miralax and Senna. Plan to discharge home with daily Miralax - GI consulted and has signed off: Do not recommend endoscopic procedures given age and comorbidities. Recommend flexible sigmoidoscopy if bleeding persists.   Leukocytosis: improving WBC down trending 25.7>23.6>19.4. ANC of 18.8>17.9. Platelets also down-trending daily (153 on admission, 100 today). Unclear etiology, given no signs of infection as patient remains afebrile, has no SOB, lungs clear, no tenderness on abdominal exam, and no wounds on exam. Likely secondary to inflammation from stercoral ulcer. Troponin stable since admission at 0.03.  - Follow up smear and haptoglobin -monitor closely for signs of infection  - monitor AM CBC  Left  leg pain  History of Falls: Chronic left leg pain. Denies any pain this morning. On exam, no erythem, edema, and good pulses. No signs of infection. Patient also given tramadol, Tylenol and Voltaren gel.  - Tylenol prn and tramadol 50mg  q12 prn for pain - Voltaren gel  - heating pad - PT/OT consulted, recommend SNF. CSW working on SNF placement to [place]. COVID-19 rapid test negative for SNF placement. - continue to monitor  Hypertension:  Stable BP overnight, 148/64 this a.m. One elevated BP to 171/84 overnight. Home meds: Losartan 50mg  and Lasix 20mg . Denies any HA or vision changes. - continue home Losartan 50mg  QD, stop if BP drops  - hold lasix   Dementia: Patient appears more demented this AM, although A&Ox3, PERRL. Tangential speech but answers most question appropriately once repeated several times. Appears some hospital delirium in addition to her baseline dementia given her mental status waxes and wanes each day.  - Delirium precautions  Peripheral Vascular Disease: History of peripheral vascular disease with external iliac to profunda bypass in 2009. Appointment with vascular surgery on  01/14/2018 showed symmetric normal ABI on the left. Also had work up for DVT in 12/13/2017 which was negative bilaterally. Home meds; Aspirin  - hold home aspirin due to active bleed  History of atrial flutter s/p Ablation: Followed by cardiology. Had atrial flutter ablation in 2014. Patient has been refusing systemic anticoagulation per cards note. Patient is elderly and a fall risk as well. Patient also not on BB's due to intolerance.   Remains asymptomatic.  Denies any chest pain or shortness of breath. - continue to monitor for chest pain  - obtain repeat EKG, troponins, and call cardiology if occurs -  recommend follow up with cardiology outpatient  Prolonged QTc: EKG significant for QTc of 527. - Avoid QT prolonging meds   Protein-Calorie Malnutrition: Elderly patient who is  cachectic and frail.  - Nutrition consulted, recommend Ensure Enlive BID, MVI daily  FEN/GI: Regular diet, Ensure Enlive  BID PPx: SCDs  Disposition: SNF when medically cleared  Subjective:  Patient repeatedly states " I am hungry, if I can only have some food. I need to go home so I can eat", although her breakfast was sitting right in front of her uneaten.  When given Ensure she did drink this without complication.  Denies any chest pain, SOB, leg pain, headache, or vision changes.  Objective: Temp:  [97 F (36.1 C)-98.3 F (36.8 C)] 98.3 F (36.8 C) (05/03 0454) Pulse Rate:  [58-78] 78 (05/03 0454) Resp:  [16-18] 16 (05/03 0454) BP: (138-171)/(60-84) 148/64 (05/03 0454) SpO2:  [96 %-100 %] 98 % (05/03 0454) Weight:  [38.1 kg] 38.1 kg (05/03 0500) Physical Exam: General: Elderly frail Caucasian woman, in no acute distress with non-toxic appearance, eating up comfortably with meal tray in front of her HEENT: normocephalic, atraumatic Neck: supple, normal ROM CV: regular rate and rhythm with systolic murmur, no lower extremity edema, 2+ radial and pedal pulses Lungs: clear to auscultation bilaterally with normal work of breathing, speaking in full sentences Abdomen: soft, non-tender, non-distended, normoactive bowel sounds Skin: warm, dry, no rashes or lesions anterior inspection of skin Extremities: warm and well perfused, no cyanosis Neuro: Alert and orientedx3, answers most questions appropriately once repeated several times, PERRL, tangential speech  Laboratory: Recent Labs  Lab 10/24/18 1534 10/25/18 0346 10/25/18 2230 10/26/18 0357  WBC 25.7* 23.6*  --  19.4*  HGB 7.5* 7.2* 9.7* 9.3*  HCT 23.2* 21.3* 28.4* 27.1*  PLT 124* 108*  --  100*   Recent Labs  Lab 10/22/18 2054  10/24/18 0254 10/25/18 0346 10/26/18 0357  NA 142   < > 142 139 134*  K 3.1*   < > 3.9 3.9 4.2  CL 103   < > 106 104 96*  CO2 27   < > 25 23 26   BUN 33*   < > 38* 47* 49*  CREATININE 1.16*    < > 1.09* 1.22* 1.28*  CALCIUM 9.1   < > 9.0 9.0 9.3  PROT 6.3*  --   --   --   --   BILITOT 0.7  --   --   --   --   ALKPHOS 118  --   --   --   --   ALT 23  --   --   --   --   AST 37  --   --   --   --   GLUCOSE 107*   < > 133* 94 89   < > = values in this interval not displayed.   COVID-19: negative LDH: 208 B12: 1551 Folate 8.8 Blood smear: pending Reticulocytes: 2.6  Imaging/Diagnostic Tests: Dg Abd Acute W/chest  Result Date: 10/22/2018 CLINICAL DATA:  Hematochezia beginning today. EXAM: DG ABDOMEN ACUTE W/ 1V CHEST COMPARISON:  05/20/2013 FINDINGS: Cardiomegaly is increased since previous study. Aortic atherosclerosis. Elevation of left hemidiaphragm is seen. Mild left pleural thickening versus small effusion. No evidence of pulmonary infiltrate or edema. Pulmonary hyperinflation is consistent with COPD. Large amount of stool is seen in the rectosigmoid colon. No evidence of dilated bowel loops or free intraperitoneal air. Extensive aortoiliac atherosclerotic calcification noted. IMPRESSION: 1. Large  amount of stool in rectosigmoid colon. No evidence of bowel obstruction or free air. 2. Cardiomegaly and COPD. 3. Elevated left hemidiaphragm. Mild left pleural thickening versus small effusion. Electronically Signed   By: Earle Gell M.D.   On: 10/22/2018 22:34    Michelle Hefty, DO 10/26/2018, 7:23 AM PGY-1, [place] Intern pager: [phone], text pages welcome

## 2018-10-26 NOTE — Progress Notes (Addendum)
Family Medicine Teaching Service Daily Progress Note Intern Pager: [phone]  Patient name: Michelle Gaines record number: [id number] Date of birth: 19-Jul-1928 Age: 85 y.o. Gender: female  Primary Care Provider: Levin Erp, MD Consultants: GI Code Status: DNR Emergency Contact: Michelle Gaines (son) [phone]  Pt Overview and Major Events to Date:  4/30 - admitted. Abdominal x-ray: Large amount of stool in rectosigmoid colon. No evidence of bowel obstruction or free air 5/2: transfusion of 1U pRBC  Assessment and Plan: Michelle H Matthewsis a 85 y.o.femalepresenting with a 1 day history of GI bleeding likely from stercoral ulcer from chronic and severe impaction. PMH is significant foratrial flutter, hypertension, PVD, falls  Social:  Spoke to son Michelle Gaines) concerning SNF placement. He would NOT like to place his mother at [facility]. If possible, he would like to try [facility] in [place], [facility] in [place], or [facility]. - Will inform CSW of son's SNF preferences  Anemia 2/2 lower GI bleed  Constipation: resolved Most likely 2/2 stercoral ulcer from chronic, severe impaction undergoing aggressive bowel regimen. Hemoglobin 9.3>8.7. Likely better representation of actual hemoglobin. No further bleeding reported. Stable vitals overnight.Tolerating p.o.  Denies any chest pain or SOB. Differential noted polychromasia consistent with immature RBC's and smudge cells. Lab consulted and noted this was a mistake and likely mechanically induced by machine. Physical smear without signs of smudge cells. - Follow up haptoglobin and blood smear - continue Miralax and Senna. Plan to discharge home with daily Miralax - GI consulted and has signed off: Do not recommend endoscopic procedures given age and comorbidities. Recommend flexible sigmoidoscopy if bleeding persists.   Leukocytosis: improving WBC continuing to down trend 23.6>19.4>14.7. ANC of 18.8>17.9>13.3. Platelets stable  (153>>100>108). Official blood smear pending. Haptoglobin pending. Unclear etiology, but reassuring given improvement daily and no signs of infection. Expect secondary to inflammation from stercoral ulcer. - Follow up smear and haptoglobin - monitor closely for signs of infection   Acute Urinary Retention: Patient endorsed feeling of needing to urinate, but unable to. UOP overnight only 329mL with one unmeasured. Bladder scan significant for 926mL. In and out cath significant for 1L. No signs of obstruction. - strict I/O's - PM bladder scan if further urinary retention - Consider urology consult if rentention continues  Peripheral Edema: Some bilateral UE edema. No pulmonary crackles on exam. No LE edema.  Left leg pain  History of Falls: Chronic left leg pain. Denies any pain this morning. Neurovascularly intact without erythema or edema. No signs of infection. Patient also given tramadol, Tylenol and Voltaren gel.  - Tylenol prn and tramadol 50mg  q12 prn for pain - Voltaren gel  - heating pad - PT/OT consulted, recommend SNF. CSW working on SNF placement to [facility]. COVID-19 rapid test negative for SNF placement. - continue to monitor  - ambulate patient  Hypertension:  Stable BP overnight, BP 157/84 this a.m. Home meds: Losartan 50mg . Denies any HA or vision changes. - continue home Losartan 50mg  QD, stop if BP drops   Dementia: Mental status improved this morning. A&Ox3. Some concern for hospital delirium in addition to baseline dementia plus possible uremia given increasing BUN 49. S/p mIVF x 6 hours. BUN 41 this AM. Can consider Tramadol affecting mental status as well since this is a new medicine since admission.  - delirium precautions - Given improvement in mental status today, will continue Tramadol q12 PRN since this is the only medicine that has been helpful for leg pain. Consider discontinuing if mental status wanes again.  Peripheral  Vascular Disease: History of  peripheral vascular disease with external iliac to profunda bypass in 2009. Appointment with vascular surgery on  01/14/2018 showed symmetric normal ABI on the left. Also had work up for DVT in 12/13/2017 which was negative bilaterally. Home meds; Aspirin  - hold home aspirin due to active bleed  History of atrial flutter s/p Ablation: Followed by cardiology. Had atrial flutter ablation in 2014. Patient has been refusing systemic anticoagulation per cards note. Patient is elderly and a fall risk as well. Patient also not on BB's due to intolerance.   Remains asymptomatic.  Denies any chest pain or shortness of breath. - continue to monitor for chest pain  - obtain repeat EKG, troponins, and call cardiology if occurs - recommend follow up with cardiology outpatient  Prolonged QTc: EKG significant for QTc of 527. - Avoid QT prolonging meds   Protein-Calorie Malnutrition: Elderly patient who is cachectic and frail.  - Nutrition consulted, recommend Ensure Enlive BID, MVI daily  FEN/GI: Regular diet, Ensure Enlive  BID PPx: SCDs  Disposition: SNF when medically cleared  Subjective:  Patient notes unable to pee. Feels like she needs to go but cant. Denies any CP, SOB, leg pain, headache, vision changes. A&Ox3.   Objective: Temp:  [98.1 F (36.7 C)-98.8 F (37.1 C)] 98.1 F (36.7 C) (05/04 0833) Pulse Rate:  [75-91] 80 (05/04 0833) Resp:  [15-21] 18 (05/04 0833) BP: (140-167)/(68-84) 150/76 (05/04 0833) SpO2:  [96 %-100 %] 100 % (05/04 0833) Weight:  [40.4 kg] 40.4 kg (05/03 2159) Physical Exam: General: thin elderly Caucasian woman, well nourished, well developed, in no acute distress with non-toxic appearance, sitting up comfortably with meal tray in front of her HEENT: normocephalic, atraumatic Neck: supple, normal ROM CV: irregular rhythm with loud systolic murmur, bilateral UE swelling, no LE edema, 2+ radial and pedal pulses bilaterally Lungs: clear to auscultation  bilaterally with normal work of breathing Abdomen: soft, mildly tender, moderately distended, normoactive bowel sounds Skin: warm, dry Extremities: warm and well perfused Neuro: Alert and orientedx3, speech normal, tangential speech, answering most questions appropriately with improved mentation today  Laboratory: Recent Labs  Lab 10/25/18 0346 10/25/18 2230 10/26/18 0357 10/27/18 0306  WBC 23.6*  --  19.4* 14.7*  HGB 7.2* 9.7* 9.3* 8.3*  HCT 21.3* 28.4* 27.1* 24.6*  PLT 108*  --  100* 106*   Recent Labs  Lab 10/22/18 2054  10/25/18 0346 10/26/18 0357 10/27/18 0306  NA 142   < > 139 134* 136  K 3.1*   < > 3.9 4.2 3.8  CL 103   < > 104 96* 104  CO2 27   < > 23 26 24   BUN 33*   < > 47* 49* 42*  CREATININE 1.16*   < > 1.22* 1.28* 1.03*  CALCIUM 9.1   < > 9.0 9.3 8.3*  PROT 6.3*  --   --   --   --   BILITOT 0.7  --   --   --   --   ALKPHOS 118  --   --   --   --   ALT 23  --   --   --   --   AST 37  --   --   --   --   GLUCOSE 107*   < > 94 89 95   < > = values in this interval not displayed.   COVID-19: negative LDH: 208 B12: 1551 Folate 8.8 Blood smear: pending Haptoglobin: pending  Reticulocytes: 2.6  Imaging/Diagnostic Tests: Dg Abd Acute W/chest  Result Date: 10/22/2018 CLINICAL DATA:  Hematochezia beginning today. EXAM: DG ABDOMEN ACUTE W/ 1V CHEST COMPARISON:  05/20/2013 FINDINGS: Cardiomegaly is increased since previous study. Aortic atherosclerosis. Elevation of left hemidiaphragm is seen. Mild left pleural thickening versus small effusion. No evidence of pulmonary infiltrate or edema. Pulmonary hyperinflation is consistent with COPD. Large amount of stool is seen in the rectosigmoid colon. No evidence of dilated bowel loops or free intraperitoneal air. Extensive aortoiliac atherosclerotic calcification noted. IMPRESSION: 1. Large amount of stool in rectosigmoid colon. No evidence of bowel obstruction or free air. 2. Cardiomegaly and COPD. 3. Elevated left  hemidiaphragm. Mild left pleural thickening versus small effusion. Electronically Signed   By: Earle Gell M.D.   On: 10/22/2018 22:34    Danna Hefty, DO 10/27/2018, 8:52 AM PGY-1, [place] Intern pager: [phone], text pages welcome

## 2018-10-26 NOTE — Progress Notes (Signed)
Pt crying and c/o pain at 10/10 in back. Staff repositioned several times and applied K pad without success. RN admin. Tramadol at this time. Will continue to monitor.

## 2018-10-27 DIAGNOSIS — M79605 Pain in left leg: Secondary | ICD-10-CM

## 2018-10-27 LAB — BASIC METABOLIC PANEL
Anion gap: 8 (ref 5–15)
BUN: 42 mg/dL — ABNORMAL HIGH (ref 8–23)
CO2: 24 mmol/L (ref 22–32)
Calcium: 8.3 mg/dL — ABNORMAL LOW (ref 8.9–10.3)
Chloride: 104 mmol/L (ref 98–111)
Creatinine, Ser: 1.03 mg/dL — ABNORMAL HIGH (ref 0.44–1.00)
GFR calc Af Amer: 56 mL/min — ABNORMAL LOW (ref >60)
GFR calc non Af Amer: 48 mL/min — ABNORMAL LOW (ref >60)
Glucose, Bld: 95 mg/dL (ref 70–99)
Potassium: 3.8 mmol/L (ref 3.5–5.1)
Sodium: 136 mmol/L (ref 135–145)

## 2018-10-27 LAB — URINALYSIS, ROUTINE W REFLEX MICROSCOPIC
Bilirubin Urine: NEGATIVE
Glucose, UA: NEGATIVE mg/dL
Hgb urine dipstick: NEGATIVE
Ketones, ur: 5 mg/dL — AB
Leukocytes,Ua: NEGATIVE
Nitrite: NEGATIVE
Protein, ur: NEGATIVE mg/dL
Specific Gravity, Urine: 1.016 (ref 1.005–1.030)
pH: 6 (ref 5.0–8.0)

## 2018-10-27 LAB — PATHOLOGIST SMEAR REVIEW

## 2018-10-27 LAB — HAPTOGLOBIN: Haptoglobin: 67 mg/dL (ref 41–333)

## 2018-10-27 LAB — CBC WITH DIFFERENTIAL/PLATELET
Abs Immature Granulocytes: 0.12 10*3/uL — ABNORMAL HIGH (ref 0.00–0.07)
Basophils Absolute: 0 10*3/uL (ref 0.0–0.1)
Basophils Relative: 0 %
Eosinophils Absolute: 0 10*3/uL (ref 0.0–0.5)
Eosinophils Relative: 0 %
HCT: 24.6 % — ABNORMAL LOW (ref 36.0–46.0)
Hemoglobin: 8.3 g/dL — ABNORMAL LOW (ref 12.0–15.0)
Immature Granulocytes: 1 %
Lymphocytes Relative: 3 %
Lymphs Abs: 0.4 10*3/uL — ABNORMAL LOW (ref 0.7–4.0)
MCH: 32 pg (ref 26.0–34.0)
MCHC: 33.7 g/dL (ref 30.0–36.0)
MCV: 95 fL (ref 80.0–100.0)
Monocytes Absolute: 0.9 10*3/uL (ref 0.1–1.0)
Monocytes Relative: 6 %
Neutro Abs: 13.3 10*3/uL — ABNORMAL HIGH (ref 1.7–7.7)
Neutrophils Relative %: 90 %
Platelets: 106 10*3/uL — ABNORMAL LOW (ref 150–400)
RBC: 2.59 MIL/uL — ABNORMAL LOW (ref 3.87–5.11)
RDW: 16.5 % — ABNORMAL HIGH (ref 11.5–15.5)
WBC: 14.7 10*3/uL — ABNORMAL HIGH (ref 4.0–10.5)
nRBC: 0 % (ref 0.0–0.2)

## 2018-10-27 MED ORDER — TAMSULOSIN HCL 0.4 MG PO CAPS: 0.4000 mg | ORAL_CAPSULE | Freq: Every day | ORAL | Status: DC

## 2018-10-27 NOTE — Progress Notes (Signed)
Occupational Therapy Treatment Patient Details Name: Michelle Gaines MRN: [id number] DOB: [birth year] Today's Date: 10/27/2018    History of present illness Pt is 85 y.o. female presenting with 1 day history of GI bleeding. PMH includes chronic constipation, atrial flutter, hypertension, PVD, falls.    OT comments  Pt very tearful today and perseverating on asking about son (worried he doesn't know where she is, worried no one has called him, etc). Therapist attempted to calm her and informed her that her son does know where she is and the social worker has talked to him.  She had a difficult time following directions and did not answer questions, always responding with a question about her son. She participated briefly in grooming while sitting EOB but became increasingly tearful. Therapist assisted her with returning to supine in bed.  Continue to recommend SNF for discharge planning. Will continue to follow acutely.   Follow Up Recommendations  SNF;Supervision/Assistance - 24 hour    Equipment Recommendations  Other (comment)(defer to next venue)    Recommendations for Other Services      Precautions / Restrictions Precautions Precautions: Fall       Mobility Bed Mobility Overal bed mobility: Needs Assistance Bed Mobility: Supine to Sit;Sit to Supine     Supine to sit: HOB elevated;Mod assist Sit to supine: Min assist;HOB elevated   General bed mobility comments: Assist to scoot hips toward EOB and to assist with elevating trunk.   Transfers                      Balance Overall balance assessment: Needs assistance Sitting-balance support: Feet supported;No upper extremity supported Sitting balance-Leahy Scale: Fair                                     ADL either performed or assessed with clinical judgement   ADL Overall ADL's : Needs assistance/impaired     Grooming: Wash/dry face;Wash/dry hands;Minimal assistance;Sitting                                 General ADL Comments: Pt sat EOB approximately 5 minutes for grooming.      Vision       Perception     Praxis      Cognition Arousal/Alertness: Awake/alert Behavior During Therapy: Anxious   Area of Impairment: Following commands;Memory;Problem solving                     Memory: Decreased short-term memory Following Commands: Follows one step commands inconsistently;Follows one step commands with increased time     Problem Solving: Slow processing General Comments: Pt very tearful and perseverative on having someone call her son to let him know where she is.         Exercises     Shoulder Instructions       General Comments      Pertinent Vitals/ Pain       Pain Assessment: Faces Faces Pain Scale: Hurts little more Pain Location: left leg Pain Descriptors / Indicators: Aching;Discomfort Pain Intervention(s): Limited activity within patient's tolerance;Monitored during session  Home Living  Prior Functioning/Environment              Frequency  Min 2X/week        Progress Toward Goals  OT Goals(current goals can now be found in the care plan section)  Progress towards OT goals: Progressing toward goals  Acute Rehab OT Goals Patient Stated Goal: to go home OT Goal Formulation: With patient Time For Goal Achievement: 11/06/18 Potential to Achieve Goals: Fair ADL Goals Pt Will Perform Upper Body Bathing: with supervision;sitting Pt Will Perform Lower Body Bathing: with supervision;sit to/from stand Pt Will Perform Upper Body Dressing: with supervision;sitting Pt Will Perform Lower Body Dressing: with supervision;sit to/from stand Pt Will Transfer to Toilet: with supervision;bedside commode Pt Will Perform Toileting - Clothing Manipulation and hygiene: with supervision;sit to/from stand  Plan Discharge plan remains appropriate;Frequency remains  appropriate    Co-evaluation                 AM-PAC OT "6 Clicks" Daily Activity     Outcome Measure   Help from another person eating meals?: None Help from another person taking care of personal grooming?: A Little Help from another person toileting, which includes using toliet, bedpan, or urinal?: A Lot Help from another person bathing (including washing, rinsing, drying)?: A Lot Help from another person to put on and taking off regular upper body clothing?: A Little Help from another person to put on and taking off regular lower body clothing?: A Lot 6 Click Score: 16    End of Session    OT Visit Diagnosis: Unsteadiness on feet (R26.81);Muscle weakness (generalized) (M62.81);History of falling (Z91.81);Pain Pain - Right/Left: Left Pain - part of body: Leg   Activity Tolerance (limited due to pt very tearful, asking about her son)   Patient Left in bed;with bed alarm set;with call bell/phone within reach   Nurse Communication Mobility status        Time: 8372-9021 OT Time Calculation (min): 11 min  Charges: OT General Charges $OT Visit: 1 Visit OT Treatments $Therapeutic Activity: 8-22 mins     Darrol Jump OTR/L [place] [phone] 10/27/2018, 2:58 PM

## 2018-10-27 NOTE — Progress Notes (Signed)
Patients bladder scan showed 941 cc of urine in the bladder. Patient unable to void on her own. MD notified and made aware. MD gave verbal order to complete in and out cath once. Orders placed and preformed. 950 cc removed via in and out catheter. Will continue to monitor.

## 2018-10-27 NOTE — TOC Progression Note (Signed)
Transition of Care [facility]) - Progression Note    Patient Details  Name: Michelle Gaines MRN: [id number] Date of Birth: 25-Oct-1928  Transition of Care [facility]) CM/SW Contact  Sharlet Salina Mila Homer, LCSW Phone Number: 10/27/2018, 3:45 PM  Clinical Narrative:  CSW talked with son, Consuelo Suthers ([phone]) regarding SNF placement for patient and his facility choice is [facility]. Contact made with [facility], admissions director and they can accept patient and son contacted and updated. CSW also advised by MD that patient will remain in hospital today and discharge on Tuesday, 5/5.    Expected Discharge Plan: [place] Barriers to Discharge: Continued Medical Work up  Expected Discharge Plan and Services Expected Discharge Plan: [place]   Discharge Planning Services: NA Post Acute Care Choice: [facility] Living arrangements for the past 2 months: Single Family Home Expected Discharge Date: 10/24/18               DME Arranged: N/A DME Agency: NA       HH Arranged: NA HH Agency: NA         Social Determinants of Health (SDOH) Interventions No SDOH interventions needed at this time.    Readmission Risk Interventions No flowsheet data found.

## 2018-10-27 NOTE — Progress Notes (Signed)
Bladder scan preformed and patient had 300 cc of fluid in her bladder. Patient placed on BSC to void, but was unable to do so. MD notified. MD stated to need for in and out cath at this time. Will continue to monitor.

## 2018-10-28 DIAGNOSIS — F0391 Unspecified dementia with behavioral disturbance: Secondary | ICD-10-CM

## 2018-10-28 DIAGNOSIS — R338 Other retention of urine: Secondary | ICD-10-CM

## 2018-10-28 LAB — BASIC METABOLIC PANEL
Anion gap: 7 (ref 5–15)
BUN: 27 mg/dL — ABNORMAL HIGH (ref 8–23)
CO2: 25 mmol/L (ref 22–32)
Calcium: 8.4 mg/dL — ABNORMAL LOW (ref 8.9–10.3)
Chloride: 106 mmol/L (ref 98–111)
Creatinine, Ser: 0.93 mg/dL (ref 0.44–1.00)
GFR calc Af Amer: >60 mL/min (ref >60)
GFR calc non Af Amer: 55 mL/min — ABNORMAL LOW (ref >60)
Glucose, Bld: 120 mg/dL — ABNORMAL HIGH (ref 70–99)
Potassium: 3.6 mmol/L (ref 3.5–5.1)
Sodium: 138 mmol/L (ref 135–145)

## 2018-10-28 MED ORDER — DICLOFENAC SODIUM 1 % TD GEL: 2.0000 g | Freq: Four times a day (QID) | TRANSDERMAL | Status: AC

## 2018-10-28 MED ORDER — SENNA 8.6 MG PO TABS: 1.0000 | ORAL_TABLET | Freq: Every day | ORAL | 0 refills | Status: AC

## 2018-10-28 MED ORDER — POLYETHYLENE GLYCOL 3350 17 G PO PACK: 17.0000 g | PACK | Freq: Two times a day (BID) | ORAL | 0 refills | Status: AC

## 2018-10-28 NOTE — TOC Transition Note (Signed)
Transition of Care (TOC) - CM/SW Discharge Note  **10/28/18 - DISCHARGE TO [facility] SKILLED NURSING FACILITY VIA AMBULANCE.   Patient Details  Name: HAZELYNN MCKENNY MRN: [id number] Date of Birth: 1929/01/15  [facility]) CM/SW Contact:  Sable Feil, LCSW Phone Number: 10/28/2018, 1:57 PM   Clinical Narrative:  Patient medically stable for discharge to [facility] skilled nursing facility.Son, Janeli Lewison advised of patient's readiness for discharge and transport to facility via ambulance. Discharge clinicals transmitted to facility and nurse provided with information to call report.     Final next level of care: Skilled Nursing Facility([facility]) Barriers to Discharge: No Barriers Identified   Patient Goals and CMS Choice Patient states their goals for this hospitalization and ongoing recovery are:: Son desires for his mother to get stronger before going home CMS Medicare.gov Compare Post Acute Care list provided to:: Other (Comment Required)(Patient provided with facilty responses) Choice offered to / list presented to : Adult Children(Son Jamse [place])  Discharge Placement PASRR number recieved: 10/24/18            Patient chooses bed at: [facility] Patient to be transferred to facility by: Ambulance Name of family member notified: Japji, Kok - [phone] Patient and family notified of of transfer: 10/28/18  Discharge Plan and Services   Discharge Planning Services: NA Post Acute Care Choice: [facility]          DME Arranged: N/A DME Agency: NA       HH Arranged: NA HH Agency: NA        Social Determinants of Health (SDOH) Interventions  No SDOH interventions needed at this time.   Readmission Risk Interventions No flowsheet data found.

## 2018-10-28 NOTE — Progress Notes (Signed)
DISCHARGE NOTE [facility]F [facility] to be discharged [facility] per MD order. Patient verbalized understanding.  Skin clean, dry and intact without evidence of skin break down, no evidence of skin tears noted. IV catheter discontinued intact, patient claims saline lock just came out of itself. . Site has ecchymosis in the medial side of her left elbow. Patient denies discomforts. No signs and symptom of  of complications. Foam dressing and pressure applied to sacrum area.        Pt denies pain at the sacral site currently. No complaints noted.  Patient free of lines, drains, and wounds.   Discharge packet assembled. An After Visit Summary (AVS) was printed and given to the EMS personnel. Patient escorted via stretcher and discharged to [facility] via ambulance. Report called to accepting facility; all questions and concerns addressed.   Dolores Hoose, RN

## 2018-10-28 NOTE — Progress Notes (Addendum)
Family Medicine Teaching Service Daily Progress Note Intern Pager: [phone]  Patient name: Michelle Gaines record number: [id number] Date of birth: 29-Dec-1928 Age: 85 y.o. Gender: female  Primary Care Provider: Levin Erp, MD Consultants: GI Code Status: DNR Emergency Contact: Richardson Landry (son) [phone]  Pt Overview and Major Events to Date:  4/30 - admitted. Abdominal x-ray: Large amount of stool in rectosigmoid colon. No evidence of bowel obstruction or free air 5/2: transfusion of 1U pRBC  Assessment and Plan: Michelle H Matthewsis a 85 y.o.femalepresenting with a 1 day history of GI bleeding likely from stercoral ulcer from chronic and severe impaction. PMH is significant foratrial flutter, hypertension, PVD, falls  Social:  Patient accepted into [facility]. Son is aware.  - CSW following, appreciate their hard work   Anemia 2/2 lower GI bleed  Constipation: resolved Most likely 2/2 stercoral ulcer from chronic, severe impaction undergoing aggressive bowel regimen. No further bleeding reported. Stable vitals overnight.Tolerating p.o.  Denies any chest pain or SOB. Blood smear with leukocytosis with predominance of neutrophils. Haptoglobin WNL. - continue Miralax and Senna. Plan to discharge home with daily Miralax  Leukocytosis: improved Likely 2/2 to inflammation from stercoral ulcer. Patient has continued to be afebrile and hemodynamically stable. Labs have down-trended. UA negative. Blood smear consistent with CBC with leukocytosis with predominance of neutrophils. Haptoglobin WNL. Denies CP, SOB.  - monitor closely for signs of infection   Peripheral Edema: stable Some bilateral UE edema. No pulmonary crackles on exam. No LE edema.  Chronic Left leg pain  History of Falls: Denies any pain this morning. Neurovascularly intact without erythema or edema. No signs of infection.  - Tylenol prn for pain - Voltaren gel  - heating pad - PT/OT consulted,  recommend SNF. Accepted to [facility]. COVID-19 rapid test negative for SNF placement. - continue to monitor  - ambulate patient  Hypertension:  Stable BP overnight. Home meds: Losartan 50mg . Denies any HA or vision changes. - continue home Losartan 50mg  QD, stop if BP drops   Dementia: Mental status improved this morning. A&Ox3. Answering all questions appropriately. Able to maintain linear conversation, improved from day prior. - delirium precautions  Peripheral Vascular Disease: stable History of peripheral vascular disease with external iliac to profunda bypass in 2009. Appointment with vascular surgery on  01/14/2018 showed symmetric normal ABI on the left. Also had work up for DVT in 12/13/2017 which was negative bilaterally. Home meds; Aspirin  - hold home aspirin due to active bleed  History of atrial flutter s/p Ablation: Followed by cardiology. Had atrial flutter ablation in 2014. Patient has been refusing systemic anticoagulation per cards note. Patient is elderly and a fall risk as well. Patient also not on BB's due to intolerance. Remains asymptomatic. Denies any chest pain or shortness of breath.   - continue to monitor for chest pain  - obtain repeat EKG, troponins, and call cardiology if occurs - recommend follow up with cardiology outpatient  Prolonged QTc: EKG significant for QTc of 527. - Avoid QT prolonging meds   Protein-Calorie Malnutrition: Elderly patient who is cachectic and frail.  - Nutrition consulted, recommend Ensure Enlive BID, MVI daily  FEN/GI: Regular diet, Ensure Enlive  BID PPx: SCDs  Disposition: SNF when medically cleared  Subjective:  Patient improved this morning.  Notes she is feeling full after eating breakfast.  Denies any chest pain, shortness of breath, leg pain, headache, vision changes.  She is upset because her son has not come to visit her.  Explained to her that I have spoken to him every day and updated him.  And he would  visit if he could.  A&Ox3.   Objective: Temp:  [98.1 F (36.7 C)-99 F (37.2 C)] 98.1 F (36.7 C) (05/05 0809) Pulse Rate:  [64-91] 81 (05/05 0809) Resp:  [16-18] 16 (05/05 0809) BP: (138-162)/(66-80) 146/66 (05/05 0809) SpO2:  [95 %-99 %] 98 % (05/05 0809) Physical Exam: General: Thin elderly Caucasian woman, well nourished, well developed, in no acute distress with non-toxic appearance, sitting up comfortably with meal tray in front of her HEENT: normocephalic, atraumatic, moist mucous membranes Neck: supple, normal ROM CV: regular rate, irreegular rhythm with loud systolic murmur, bilateral UE swelling (improved from day prior), no LE edema, 2+ radial and pedal pulses bilaterally   Lungs: clear to auscultation bilaterally with normal work of breathing Abdomen: soft, non-tender, mildly distended, normoactive bowel sounds Skin: warm, dry Extremities: warm and well perfused Neuro: Alert and orientedx2, more linear speech, answered all questions appropriately this AM  Laboratory: Recent Labs  Lab 10/25/18 0346 10/25/18 2230 10/26/18 0357 10/27/18 0306  WBC 23.6*  --  19.4* 14.7*  HGB 7.2* 9.7* 9.3* 8.3*  HCT 21.3* 28.4* 27.1* 24.6*  PLT 108*  --  100* 106*   Recent Labs  Lab 10/22/18 2054  10/25/18 0346 10/26/18 0357 10/27/18 0306  NA 142   < > 139 134* 136  K 3.1*   < > 3.9 4.2 3.8  CL 103   < > 104 96* 104  CO2 27   < > 23 26 24   BUN 33*   < > 47* 49* 42*  CREATININE 1.16*   < > 1.22* 1.28* 1.03*  CALCIUM 9.1   < > 9.0 9.3 8.3*  PROT 6.3*  --   --   --   --   BILITOT 0.7  --   --   --   --   ALKPHOS 118  --   --   --   --   ALT 23  --   --   --   --   AST 37  --   --   --   --   GLUCOSE 107*   < > 94 89 95   < > = values in this interval not displayed.   COVID-19: negative LDH: 208 B12: 1551 Folate 8.8 Blood smear: leukocytosis with predominance of neutrophils Haptoglobin: 67 Reticulocytes: 2.6  Imaging/Diagnostic Tests: Dg Abd Acute W/chest  Result  Date: 10/22/2018 CLINICAL DATA:  Hematochezia beginning today. EXAM: DG ABDOMEN ACUTE W/ 1V CHEST COMPARISON:  05/20/2013 FINDINGS: Cardiomegaly is increased since previous study. Aortic atherosclerosis. Elevation of left hemidiaphragm is seen. Mild left pleural thickening versus small effusion. No evidence of pulmonary infiltrate or edema. Pulmonary hyperinflation is consistent with COPD. Large amount of stool is seen in the rectosigmoid colon. No evidence of dilated bowel loops or free intraperitoneal air. Extensive aortoiliac atherosclerotic calcification noted. IMPRESSION: 1. Large amount of stool in rectosigmoid colon. No evidence of bowel obstruction or free air. 2. Cardiomegaly and COPD. 3. Elevated left hemidiaphragm. Mild left pleural thickening versus small effusion. Electronically Signed   By: Earle Gell M.D.   On: 10/22/2018 22:34    Danna Hefty, DO 10/28/2018, 8:47 AM PGY-1, [place] Intern pager: [phone], text pages welcome

## 2018-10-28 NOTE — Progress Notes (Signed)
Physical Therapy Treatment Patient Details Name: Michelle Gaines MRN: [id number] DOB: March 10, 1929 Today's Date: 10/28/2018    History of Present Illness Pt is 85 y.o. female presenting with 1 day history of GI bleeding. PMH includes chronic constipation, atrial flutter, hypertension, PVD, falls.     PT Comments    Upon PT arrival, pt on the phone with her daughter-in-law stating "I don't want to go to rehab, I want to go home". Pt states she has been kept in the hospital unnecessarily, and that is why she has to go to rehab now. PT spoke with pt's son per pt request, and pt's son states pt is more confused today, and is frustrated about going to rehab but he knows she needs to go there. During PT session, pt lacks safety awareness with transfers and fatigues quickly with activity. Pt requires min-mod assist for bed mobility and transfers at this time. PT continuing to recommend SNF level of care upon d/c, pt with possible d/c today. Will continue to follow acutely.    Follow Up Recommendations  SNF     Equipment Recommendations  Other (comment)(TBD)    Recommendations for Other Services       Precautions / Restrictions Precautions Precautions: Fall Restrictions Weight Bearing Restrictions: No    Mobility  Bed Mobility Overal bed mobility: Needs Assistance Bed Mobility: Supine to Sit     Supine to sit: Min assist;HOB elevated     General bed mobility comments: Min assist for trunk elevation and scooting hips to EOB with use of bed pad. Pt stastes "let me do it myself", but unsuccessful at scooting without physical assist.   Transfers Overall transfer level: Needs assistance Equipment used: Rolling walker (2 wheeled) Transfers: Sit to/from [facility] Sit to Stand: Mod assist;From elevated surface Stand pivot transfers: From elevated surface;Mod assist       General transfer comment: Mod assist for sit to stand for power up, hand placement, and steadying  pt and RW. VC for hand placement. Pt with stand pivot transfer x2, once to [facility] due to pt being soiled with stool and needing to urinate, and once to recliner. Pt with demonstrating lack of safety awareness with transfer to recliner as pt attempting to sit down due to L knee pain prior to reaching recliner. PT guided pt hips into recliner and assisted in slow eccentric lowering.   Ambulation/Gait Ambulation/Gait assistance: (NT - pt fatigued and complaining of too much LLE pain)               Stairs             Wheelchair Mobility    Modified Rankin (Stroke Patients Only)       Balance Overall balance assessment: Needs assistance Sitting-balance support: Bilateral upper extremity supported Sitting balance-Leahy Scale: Fair     Standing balance support: Bilateral upper extremity supported Standing balance-Leahy Scale: Poor Standing balance comment: reliant on PT and UE support for steadying in standing                            Cognition Arousal/Alertness: Awake/alert Behavior During Therapy: Anxious Overall Cognitive Status: No family/caregiver present to determine baseline cognitive functioning Area of Impairment: Following commands;Memory;Problem solving;Orientation;Safety/judgement                 Orientation Level: (A&O x4, frustrated with PT questions stating she knows all the answers)   Memory: Decreased short-term memory Following Commands: Follows  one step commands inconsistently;Follows one step commands with increased time Safety/Judgement: Decreased awareness of deficits;Decreased awareness of safety   Problem Solving: Slow processing General Comments: Pt confused about going to rehab, stating she wants to d/c home. In the next breath, pt asks if I will see her tomorrow even though pt informed we are at [facility] and her rehab facility is separate from [facility]. Pt tearful in standing due to LLE pain, and attempting to sit prior to reaching  recliner.      Exercises      General Comments General comments (skin integrity, edema, etc.): Pt with sacral wound starting in 2 places, pt states RN applied sacral dressing to this but pt took it off due to "the dressing hurting me".       Pertinent Vitals/Pain Pain Assessment: Faces Faces Pain Scale: Hurts even more Pain Location: left leg, "tailbone" Pain Descriptors / Indicators: Crying;Aching Pain Intervention(s): Limited activity within patient's tolerance;Monitored during session;Repositioned    Home Living                      Prior Function            PT Goals (current goals can now be found in the care plan section) Acute Rehab PT Goals Patient Stated Goal: to go home PT Goal Formulation: With patient Potential to Achieve Goals: Fair Progress towards PT goals: Progressing toward goals    Frequency    Min 3X/week      PT Plan Current plan remains appropriate    Co-evaluation              AM-PAC PT "6 Clicks" Mobility   Outcome Measure  Help needed turning from your back to your side while in a flat bed without using bedrails?: A Lot Help needed moving from lying on your back to sitting on the side of a flat bed without using bedrails?: A Lot Help needed moving to and from a bed to a chair (including a wheelchair)?: A Lot Help needed standing up from a chair using your arms (e.g., wheelchair or bedside chair)?: A Lot Help needed to walk in hospital room?: Total Help needed climbing 3-5 steps with a railing? : Total 6 Click Score: 10    End of Session Equipment Utilized During Treatment: Gait belt Activity Tolerance: Patient limited by fatigue;Patient limited by pain Patient left: in chair;with chair alarm set;with call bell/phone within reach Nurse Communication: Mobility status PT Visit Diagnosis: Unsteadiness on feet (R26.81)     Time: 6812-7517 PT Time Calculation (min) (ACUTE ONLY): 29 min  Charges:  $Therapeutic Activity:  23-37 mins                    Julien Girt, PT Acute Rehabilitation Services Pager [phone]  Office [phone]  Elianys Conry D Rayelynn Loyal 10/28/2018, 10:30 AM

## 2018-11-05 NOTE — ED Provider Notes (Signed)
[facility] Provider Note   CSN: [id number] Arrival date & time: 10/22/18  2021    History   Chief Complaint Chief Complaint  Patient presents with  . Rectal Bleeding    HPI Michelle Gaines is a 85 y.o. female.     HPI Patient reportedly has been having some bleeding from the anal area.  She reports that she has 2 strain at stool and often has difficulty with constipation.  Several hours prior to coming to the emergency department she started having some bright red bleeding.  She denies any prior history of having bleeding with bowel movement or any known GI bleed.  She denies she is really having any abdominal pain.  She has not had a syncopal episode.  Patient reports she is also however having left leg pain.  Reports has been quite painful but she does not localize it well.  She has not perceived any swelling or redness or color change. Past Medical History:  Diagnosis Date  . Ankle fracture, left 09/2015  . Arthritis   . Atrial flutter ([place])   . Cancer ([place])    OVARIAN    1967  . Coronary artery disease   . Dysrhythmia    ATRIAL FLUTTER  . GERD (gastroesophageal reflux disease)   . Hypertension   . Peripheral arterial disease ([place])   . PVD (peripheral vascular disease) [facility])     Patient Active Problem List   Diagnosis Date Noted  . Macrocytic anemia   . GI bleed 10/23/2018  . Pressure injury of skin 10/23/2018  . Fecal impaction ([place])   . Rectal bleeding   . Ganglion cyst of dorsum of right wrist 07/18/2017  . Closed nondisplaced fracture of proximal phalanx of right great toe 07/18/2017  . Ankle fracture 10/13/2015  . Closed left ankle fracture 10/12/2015  . GERD (gastroesophageal reflux disease) 10/12/2015  . Anxiety 10/12/2015  . Fall   . Hip fracture ([place]) 07/28/2014  . Protein-calorie malnutrition, severe ([place]) 07/28/2014  . Dizziness 04/24/2012  . Restless leg 03/15/2011  . Atrial flutter ([place]) 08/17/2010  . Essential hypertension  08/16/2010  . Coronary atherosclerosis 08/16/2010  . PVD 08/16/2010    Past Surgical History:  Procedure Laterality Date  . ABDOMINAL HYSTERECTOMY    . ATRIAL FLUTTER ABLATION N/A 05/22/2013   Procedure: ATRIAL FLUTTER ABLATION;  Surgeon: Evans Lance, MD;  Location: [facility] CATH LAB;  Service: Cardiovascular;  Laterality: N/A;  . BPG    . CAROTID STENT  11/30/09   LAD S/P Right CA DES  . FRACTURE SURGERY Left Feb. 2, 2016   Left Hip Fx   . GLIAYTE CATHERTER INSERTION  06/27/10  . INTRAMEDULLARY (IM) NAIL INTERTROCHANTERIC Left 07/28/2014   Procedure: Left Affixus Nail;  Surgeon: Marybelle Killings, MD;  Location: [place];  Service: Orthopedics;  Laterality: Left;  . PR VEIN BYPASS GRAFT,AORTO-FEM-POP     Right      OB History   No obstetric history on file.      Home Medications    Prior to Admission medications   Medication Sig Start Date End Date Taking? Authorizing Provider  cholecalciferol (VITAMIN D3) 25 MCG (1000 UT) tablet Take 1,000 Units by mouth daily.   Yes [provider]  losartan (COZAAR) 50 MG tablet Take 50 mg by mouth daily. 01/21/18  Yes [provider]  multivitamin (THERAGRAN) per tablet Take 1 tablet by mouth daily.     Yes [provider]  nitroGLYCERIN (  NITROSTAT) 0.4 MG SL tablet Place 1 tablet (0.4 mg total) under the tongue every 5 (five) minutes as needed for chest pain. 05/23/13  Yes Barrett, Evelene Croon, PA-C  diclofenac sodium (VOLTAREN) 1 % GEL Apply 2 g topically 4 (four) times daily. 10/28/18   Shirley, [place], DO  polyethylene glycol (MIRALAX / GLYCOLAX) 17 g packet Take 17 g by mouth 2 (two) times daily. 10/28/18   Shirley, [place], DO  senna (SENOKOT) 8.6 MG TABS tablet Take 1 tablet (8.6 mg total) by mouth daily. 10/29/18   Shirley, [place], DO    Family History Family History  Problem Relation Age of Onset  . COPD Father   . Hypertension Father     Social History Social History   Tobacco Use  . Smoking status: Never Smoker  .  Smokeless tobacco: Never Used  Substance Use Topics  . Alcohol use: No  . Drug use: No     Allergies   Fexofenadine; Cephalosporins; Diltiazem; Penicillins; Amlodipine; Anaprox [naproxen sodium]; Benzonatate; Cephalexin; Clindamycin/lincomycin; Clorazepate; Codeine; Doxycycline; Hydralazine; Labetalol; Lisinopril; Phenylephrine; Phenytoin; and Zolpidem   Review of Systems Review of Systems 10 Systems reviewed and are negative for acute change except as noted in the HPI.   Physical Exam Updated Vital Signs BP (!) 146/66 (BP Location: Left Arm)   Pulse 81   Temp 98.1 F (36.7 C) (Oral)   Resp 16   Ht 5\' 2"  (1.575 m)   Wt 40.4 kg   SpO2 98%   BMI 16.28 kg/m   Physical Exam Constitutional:      Comments: Patient is alert and appropriate.  No respiratory distress.  Cooperative.  HENT:     Head: Normocephalic and atraumatic.     Mouth/Throat:     Mouth: Mucous membranes are moist.     Pharynx: Oropharynx is clear.  Eyes:     Extraocular Movements: Extraocular movements intact.  Neck:     Musculoskeletal: Neck supple.  Cardiovascular:     Rate and Rhythm: Normal rate and regular rhythm.  Pulmonary:     Comments: Grossly clear.  No wheeze or rhonchi.  No respiratory distress. Abdominal:     Comments: Lower abdomen is quite distended but soft and nontender.  Genitourinary:    Comments: Rectal exam has large stool impaction and also large amount of red blood. Musculoskeletal:     Comments: Legs look symmetric.  I cannot localize any focal area of pain on the left lower extremity.  Soft tissues are normal.  Distal pulses are 2+.  Range of motion is symmetric bilaterally.  No finding that localizes any neurovascular dysfunction to the left lower extremity.  Skin:    General: Skin is warm and dry.  Neurological:     General: No focal deficit present.     Mental Status: She is oriented to person, place, and time.     Coordination: Coordination normal.  Psychiatric:         Mood and Affect: Mood normal.      ED Treatments / Results  Labs (all labs ordered are listed, but only abnormal results are displayed) Labs Reviewed  COMPREHENSIVE METABOLIC PANEL - Abnormal; Notable for the following components:      Result Value   Potassium 3.1 (*)    Glucose, Bld 107 (*)    BUN 33 (*)    Creatinine, Ser 1.16 (*)    Total Protein 6.3 (*)    Albumin 3.4 (*)    GFR calc non Af Amer 42 (*)  GFR calc Af Amer 49 (*)    All other components within normal limits  CBC - Abnormal; Notable for the following components:   RBC 3.59 (*)    Hemoglobin 11.6 (*)    MCV 101.4 (*)    All other components within normal limits  BASIC METABOLIC PANEL - Abnormal; Notable for the following components:   Glucose, Bld 106 (*)    BUN 32 (*)    Calcium 8.5 (*)    GFR calc non Af Amer 54 (*)    All other components within normal limits  CBC - Abnormal; Notable for the following components:   RBC 2.44 (*)    Hemoglobin 8.0 (*)    HCT 24.3 (*)    Platelets 122 (*)    All other components within normal limits  PROTIME-INR - Abnormal; Notable for the following components:   Prothrombin Time 16.4 (*)    INR 1.3 (*)    All other components within normal limits  CBC - Abnormal; Notable for the following components:   RBC 2.38 (*)    Hemoglobin 7.7 (*)    HCT 23.8 (*)    Platelets 132 (*)    All other components within normal limits  CBC - Abnormal; Notable for the following components:   WBC 14.9 (*)    RBC 2.44 (*)    Hemoglobin 7.9 (*)    HCT 24.5 (*)    MCV 100.4 (*)    Platelets 138 (*)    All other components within normal limits  BASIC METABOLIC PANEL - Abnormal; Notable for the following components:   Glucose, Bld 133 (*)    BUN 38 (*)    Creatinine, Ser 1.09 (*)    GFR calc non Af Amer 45 (*)    GFR calc Af Amer 52 (*)    All other components within normal limits  CBC - Abnormal; Notable for the following components:   WBC 22.5 (*)    RBC 2.23 (*)     Hemoglobin 7.2 (*)    HCT 22.7 (*)    MCV 101.8 (*)    Platelets 120 (*)    All other components within normal limits  CBC - Abnormal; Notable for the following components:   WBC 25.7 (*)    RBC 2.31 (*)    Hemoglobin 7.5 (*)    HCT 23.2 (*)    MCV 100.4 (*)    Platelets 124 (*)    All other components within normal limits  CBC - Abnormal; Notable for the following components:   WBC 23.6 (*)    RBC 2.17 (*)    Hemoglobin 7.2 (*)    HCT 21.3 (*)    Platelets 108 (*)    All other components within normal limits  BASIC METABOLIC PANEL - Abnormal; Notable for the following components:   BUN 47 (*)    Creatinine, Ser 1.22 (*)    GFR calc non Af Amer 40 (*)    GFR calc Af Amer 46 (*)    All other components within normal limits  DIFFERENTIAL - Abnormal; Notable for the following components:   Neutro Abs 18.8 (*)    Lymphs Abs 0.4 (*)    Abs Immature Granulocytes 0.17 (*)    All other components within normal limits  TROPONIN I - Abnormal; Notable for the following components:   Troponin I 0.03 (*)    All other components within normal limits  LACTATE DEHYDROGENASE - Abnormal; Notable for the following components:  LDH 208 (*)    All other components within normal limits  VITAMIN B12 - Abnormal; Notable for the following components:   Vitamin B-12 1,551 (*)    All other components within normal limits  RETICULOCYTES - Abnormal; Notable for the following components:   RBC. 2.87 (*)    Immature Retic Fract 24.2 (*)    All other components within normal limits  CBC WITH DIFFERENTIAL/PLATELET - Abnormal; Notable for the following components:   WBC 19.4 (*)    RBC 2.87 (*)    Hemoglobin 9.3 (*)    HCT 27.1 (*)    RDW 16.6 (*)    Platelets 100 (*)    Neutro Abs 17.9 (*)    Lymphs Abs 0.5 (*)    Abs Immature Granulocytes 0.14 (*)    All other components within normal limits  BASIC METABOLIC PANEL - Abnormal; Notable for the following components:   Sodium 134 (*)     Chloride 96 (*)    BUN 49 (*)    Creatinine, Ser 1.28 (*)    GFR calc non Af Amer 37 (*)    GFR calc Af Amer 43 (*)    All other components within normal limits  HEMOGLOBIN AND HEMATOCRIT, BLOOD - Abnormal; Notable for the following components:   Hemoglobin 9.7 (*)    HCT 28.4 (*)    All other components within normal limits  CBC WITH DIFFERENTIAL/PLATELET - Abnormal; Notable for the following components:   WBC 14.7 (*)    RBC 2.59 (*)    Hemoglobin 8.3 (*)    HCT 24.6 (*)    RDW 16.5 (*)    Platelets 106 (*)    Neutro Abs 13.3 (*)    Lymphs Abs 0.4 (*)    Abs Immature Granulocytes 0.12 (*)    All other components within normal limits  BASIC METABOLIC PANEL - Abnormal; Notable for the following components:   BUN 42 (*)    Creatinine, Ser 1.03 (*)    Calcium 8.3 (*)    GFR calc non Af Amer 48 (*)    GFR calc Af Amer 56 (*)    All other components within normal limits  URINALYSIS, ROUTINE W REFLEX MICROSCOPIC - Abnormal; Notable for the following components:   Ketones, ur 5 (*)    All other components within normal limits  BASIC METABOLIC PANEL - Abnormal; Notable for the following components:   Glucose, Bld 120 (*)    BUN 27 (*)    Calcium 8.4 (*)    GFR calc non Af Amer 55 (*)    All other components within normal limits  POC OCCULT BLOOD, ED - Abnormal; Notable for the following components:   Fecal Occult Bld POSITIVE (*)    All other components within normal limits  SARS CORONAVIRUS 2 (HOSPITAL ORDER, [place] [facility])  APTT  MAGNESIUM  HAPTOGLOBIN  FOLATE  PATHOLOGIST SMEAR REVIEW  SAVE SMEAR (SSMR)  TYPE AND SCREEN  PREPARE RBC (CROSSMATCH)  TYPE AND SCREEN    EKG EKG Interpretation  Date/Time:  Thursday October 23 2018 10:24:21 EDT Ventricular Rate:  79 PR Interval:  84 QRS Duration: 140 QT Interval:  460 QTC Calculation: 527 R Axis:   36 Text Interpretation:  Undetermined rhythm Right bundle branch block T wave abnormality,  consider inferolateral ischemia Abnormal ECG No significant change since last tracing Confirmed by Lacretia Leigh ([id number]) on 10/24/2018 6:12:07 PM   Radiology No results found.  Procedures Fecal disimpaction  Date/Time: 11/05/2018 6:30 PM Performed by: Charlesetta Shanks, MD Authorized by: Charlesetta Shanks, MD  Consent given by: patient Patient understanding: patient states understanding of the procedure being performed Comments: Large amount of very firm stool manually removed.  Patient did have bleeding consistent with likely internal hemorrhoidal bleeding.  This was present on the outside of the stool.  Patient did not have clot or brisk hemorrhagic type bleeding.    (including critical care time)  Medications Ordered in ED Medications  0.9 %  sodium chloride infusion ( Intravenous Stopped 10/23/18 1801)  0.9 %  sodium chloride infusion ( Intravenous Stopped 10/26/18 2201)  potassium chloride SA (K-DUR) CR tablet 40 mEq (40 mEq Oral Given 10/23/18 0224)  0.9 %  sodium chloride infusion (Manually program via Guardrails IV Fluids) ( Intravenous New Bag/Given 10/25/18 1809)     Initial Impression / Assessment and Plan / ED Course  I have reviewed the triage vital signs and the nursing notes.  Pertinent labs & imaging results that were available during my care of the patient were reviewed by me and considered in my medical decision making (see chart for details).  Clinical Course as of Nov 04 1821  Wed Oct 22, 2018  2323 Consult: Reviewed with family medicine teaching service for admission.   [MP]    Clinical Course User Index [MP] Charlesetta Shanks, MD      Patient presents with GI bleed.  This appears likely lower and suspected internal hemorrhoids.  Patient was very impacted.  Disimpaction started by myself in the emergency department.  Patient did tolerate well.  Was a fair amount of bleeding associated with this but most blood was coating stool.  Stool itself was not melanotic.  Patient  also complained of left lower extremity pain.  This seems to have somewhat of a more chronic component to it.  At this time cannot identify any neurovascular injury.  May need further evaluation.  Plan for admission for lower GI bleed with severe constipation and fecal impaction.  Final Clinical Impressions(s) / ED Diagnoses   Final diagnoses:  Rectal bleeding  Fecal impaction ([place])  Left leg pain    ED Discharge Orders         Ordered    senna (SENOKOT) 8.6 MG TABS tablet  Daily     10/28/18 1240    polyethylene glycol (MIRALAX / GLYCOLAX) 17 g packet  2 times daily     10/28/18 1240    diclofenac sodium (VOLTAREN) 1 % GEL  4 times daily     10/28/18 1240    Call MD for:  temperature >100.4     10/28/18 1240    Call MD for:  persistant nausea and vomiting     10/28/18 1240    Call MD for:  persistant dizziness or light-headedness     10/28/18 1240    Call MD for:  difficulty breathing, headache or visual disturbances     10/28/18 1240           Charlesetta Shanks, MD 11/05/18 1832

## 2018-11-11 ENCOUNTER — Other Ambulatory Visit: Payer: Self-pay

## 2018-11-11 ENCOUNTER — Emergency Department (HOSPITAL_COMMUNITY): Payer: Medicare Other

## 2018-11-11 ENCOUNTER — Encounter (HOSPITAL_COMMUNITY): Payer: Self-pay

## 2018-11-11 ENCOUNTER — Inpatient Hospital Stay (HOSPITAL_COMMUNITY)
Admission: EM | Admit: 2018-11-11 | Discharge: 2018-11-24 | DRG: 871 | Disposition: E | Payer: Medicare Other | Source: Skilled Nursing Facility | Attending: Family Medicine | Admitting: Family Medicine

## 2018-11-11 DIAGNOSIS — D539 Nutritional anemia, unspecified: Secondary | ICD-10-CM | POA: Diagnosis present

## 2018-11-11 DIAGNOSIS — E43 Unspecified severe protein-calorie malnutrition: Secondary | ICD-10-CM | POA: Diagnosis present

## 2018-11-11 DIAGNOSIS — R4182 Altered mental status, unspecified: Secondary | ICD-10-CM | POA: Diagnosis not present

## 2018-11-11 DIAGNOSIS — Z90710 Acquired absence of both cervix and uterus: Secondary | ICD-10-CM

## 2018-11-11 DIAGNOSIS — H6690 Otitis media, unspecified, unspecified ear: Secondary | ICD-10-CM | POA: Diagnosis present

## 2018-11-11 DIAGNOSIS — Z880 Allergy status to penicillin: Secondary | ICD-10-CM

## 2018-11-11 DIAGNOSIS — R6520 Severe sepsis without septic shock: Secondary | ICD-10-CM | POA: Diagnosis not present

## 2018-11-11 DIAGNOSIS — G9341 Metabolic encephalopathy: Secondary | ICD-10-CM | POA: Diagnosis present

## 2018-11-11 DIAGNOSIS — Z881 Allergy status to other antibiotic agents status: Secondary | ICD-10-CM

## 2018-11-11 DIAGNOSIS — H7090 Unspecified mastoiditis, unspecified ear: Secondary | ICD-10-CM | POA: Diagnosis present

## 2018-11-11 DIAGNOSIS — J90 Pleural effusion, not elsewhere classified: Secondary | ICD-10-CM | POA: Diagnosis present

## 2018-11-11 DIAGNOSIS — N179 Acute kidney failure, unspecified: Secondary | ICD-10-CM | POA: Diagnosis present

## 2018-11-11 DIAGNOSIS — Z681 Body mass index (BMI) 19 or less, adult: Secondary | ICD-10-CM

## 2018-11-11 DIAGNOSIS — L8993 Pressure ulcer of unspecified site, stage 3: Secondary | ICD-10-CM

## 2018-11-11 DIAGNOSIS — R54 Age-related physical debility: Secondary | ICD-10-CM | POA: Diagnosis present

## 2018-11-11 DIAGNOSIS — K5641 Fecal impaction: Secondary | ICD-10-CM | POA: Diagnosis present

## 2018-11-11 DIAGNOSIS — A419 Sepsis, unspecified organism: Principal | ICD-10-CM | POA: Diagnosis present

## 2018-11-11 DIAGNOSIS — I82622 Acute embolism and thrombosis of deep veins of left upper extremity: Secondary | ICD-10-CM | POA: Diagnosis present

## 2018-11-11 DIAGNOSIS — G2581 Restless legs syndrome: Secondary | ICD-10-CM | POA: Diagnosis present

## 2018-11-11 DIAGNOSIS — I4892 Unspecified atrial flutter: Secondary | ICD-10-CM | POA: Diagnosis present

## 2018-11-11 DIAGNOSIS — B379 Candidiasis, unspecified: Secondary | ICD-10-CM | POA: Diagnosis present

## 2018-11-11 DIAGNOSIS — F419 Anxiety disorder, unspecified: Secondary | ICD-10-CM | POA: Diagnosis present

## 2018-11-11 DIAGNOSIS — K5289 Other specified noninfective gastroenteritis and colitis: Secondary | ICD-10-CM | POA: Diagnosis present

## 2018-11-11 DIAGNOSIS — Z8249 Family history of ischemic heart disease and other diseases of the circulatory system: Secondary | ICD-10-CM

## 2018-11-11 DIAGNOSIS — Z515 Encounter for palliative care: Secondary | ICD-10-CM | POA: Diagnosis not present

## 2018-11-11 DIAGNOSIS — I2510 Atherosclerotic heart disease of native coronary artery without angina pectoris: Secondary | ICD-10-CM | POA: Diagnosis present

## 2018-11-11 DIAGNOSIS — I739 Peripheral vascular disease, unspecified: Secondary | ICD-10-CM | POA: Diagnosis present

## 2018-11-11 DIAGNOSIS — K219 Gastro-esophageal reflux disease without esophagitis: Secondary | ICD-10-CM | POA: Diagnosis present

## 2018-11-11 DIAGNOSIS — R64 Cachexia: Secondary | ICD-10-CM | POA: Diagnosis present

## 2018-11-11 DIAGNOSIS — Z79899 Other long term (current) drug therapy: Secondary | ICD-10-CM

## 2018-11-11 DIAGNOSIS — E872 Acidosis: Secondary | ICD-10-CM | POA: Diagnosis present

## 2018-11-11 DIAGNOSIS — I119 Hypertensive heart disease without heart failure: Secondary | ICD-10-CM | POA: Diagnosis present

## 2018-11-11 DIAGNOSIS — Z20828 Contact with and (suspected) exposure to other viral communicable diseases: Secondary | ICD-10-CM | POA: Diagnosis present

## 2018-11-11 DIAGNOSIS — Z825 Family history of asthma and other chronic lower respiratory diseases: Secondary | ICD-10-CM

## 2018-11-11 DIAGNOSIS — Z66 Do not resuscitate: Secondary | ICD-10-CM | POA: Diagnosis present

## 2018-11-11 DIAGNOSIS — L89153 Pressure ulcer of sacral region, stage 3: Secondary | ICD-10-CM | POA: Diagnosis present

## 2018-11-11 DIAGNOSIS — K7200 Acute and subacute hepatic failure without coma: Secondary | ICD-10-CM | POA: Diagnosis present

## 2018-11-11 DIAGNOSIS — R400 Somnolence: Secondary | ICD-10-CM

## 2018-11-11 DIAGNOSIS — Z888 Allergy status to other drugs, medicaments and biological substances status: Secondary | ICD-10-CM

## 2018-11-11 LAB — COMPREHENSIVE METABOLIC PANEL
ALT: 2466 U/L — ABNORMAL HIGH (ref 0–44)
AST: 3718 U/L — ABNORMAL HIGH (ref 15–41)
Albumin: 2.5 g/dL — ABNORMAL LOW (ref 3.5–5.0)
Alkaline Phosphatase: 188 U/L — ABNORMAL HIGH (ref 38–126)
Anion gap: 19 — ABNORMAL HIGH (ref 5–15)
BUN: 54 mg/dL — ABNORMAL HIGH (ref 8–23)
CO2: 16 mmol/L — ABNORMAL LOW (ref 22–32)
Calcium: 8.3 mg/dL — ABNORMAL LOW (ref 8.9–10.3)
Chloride: 106 mmol/L (ref 98–111)
Creatinine, Ser: 2.01 mg/dL — ABNORMAL HIGH (ref 0.44–1.00)
GFR calc Af Amer: 25 mL/min — ABNORMAL LOW (ref >60)
GFR calc non Af Amer: 22 mL/min — ABNORMAL LOW (ref >60)
Glucose, Bld: 131 mg/dL — ABNORMAL HIGH (ref 70–99)
Potassium: 4.6 mmol/L (ref 3.5–5.1)
Sodium: 141 mmol/L (ref 135–145)
Total Bilirubin: 1.9 mg/dL — ABNORMAL HIGH (ref 0.3–1.2)
Total Protein: 5.8 g/dL — ABNORMAL LOW (ref 6.5–8.1)

## 2018-11-11 LAB — TYPE AND SCREEN
ABO/RH(D): O POS
Antibody Screen: NEGATIVE

## 2018-11-11 LAB — CBC WITH DIFFERENTIAL/PLATELET
Abs Immature Granulocytes: 0.76 10*3/uL — ABNORMAL HIGH (ref 0.00–0.07)
Basophils Absolute: 0.2 10*3/uL — ABNORMAL HIGH (ref 0.0–0.1)
Basophils Relative: 1 %
Eosinophils Absolute: 0.1 10*3/uL (ref 0.0–0.5)
Eosinophils Relative: 0 %
HCT: 33.7 % — ABNORMAL LOW (ref 36.0–46.0)
Hemoglobin: 10.5 g/dL — ABNORMAL LOW (ref 12.0–15.0)
Immature Granulocytes: 2 %
Lymphocytes Relative: 2 %
Lymphs Abs: 0.5 10*3/uL — ABNORMAL LOW (ref 0.7–4.0)
MCH: 30.7 pg (ref 26.0–34.0)
MCHC: 31.2 g/dL (ref 30.0–36.0)
MCV: 98.5 fL (ref 80.0–100.0)
Monocytes Absolute: 0.4 10*3/uL (ref 0.1–1.0)
Monocytes Relative: 1 %
Neutro Abs: 29.5 10*3/uL — ABNORMAL HIGH (ref 1.7–7.7)
Neutrophils Relative %: 94 %
Platelets: 104 10*3/uL — ABNORMAL LOW (ref 150–400)
RBC: 3.42 MIL/uL — ABNORMAL LOW (ref 3.87–5.11)
RDW: 15.2 % (ref 11.5–15.5)
WBC Morphology: INCREASED
WBC: 31.4 10*3/uL — ABNORMAL HIGH (ref 4.0–10.5)
nRBC: 0.7 % — ABNORMAL HIGH (ref 0.0–0.2)

## 2018-11-11 LAB — URINALYSIS, ROUTINE W REFLEX MICROSCOPIC
Bilirubin Urine: NEGATIVE
Glucose, UA: NEGATIVE mg/dL
Ketones, ur: NEGATIVE mg/dL
Nitrite: NEGATIVE
Protein, ur: 100 mg/dL — AB
Specific Gravity, Urine: 1.016 (ref 1.005–1.030)
pH: 5 (ref 5.0–8.0)

## 2018-11-11 LAB — LACTIC ACID, PLASMA
Lactic Acid, Venous: 7.1 mmol/L (ref 0.5–1.9)
Lactic Acid, Venous: 7.2 mmol/L (ref 0.5–1.9)
Lactic Acid, Venous: 7.7 mmol/L (ref 0.5–1.9)

## 2018-11-11 LAB — CBG MONITORING, ED: Glucose-Capillary: 110 mg/dL — ABNORMAL HIGH (ref 70–99)

## 2018-11-11 LAB — LIPASE, BLOOD: Lipase: 40 U/L (ref 11–51)

## 2018-11-11 LAB — SARS CORONAVIRUS 2 BY RT PCR (HOSPITAL ORDER, PERFORMED IN ~~LOC~~ HOSPITAL LAB): SARS Coronavirus 2: NEGATIVE

## 2018-11-11 MED ORDER — SODIUM CHLORIDE 0.9 % IV SOLN
2.0000 g | Freq: Once | INTRAVENOUS | Status: AC
  Administered 2018-11-11: 2 g via INTRAVENOUS
  Filled 2018-11-11: qty 2

## 2018-11-11 MED ORDER — METRONIDAZOLE IN NACL 5-0.79 MG/ML-% IV SOLN
500.0000 mg | Freq: Once | INTRAVENOUS | Status: AC
  Administered 2018-11-11: 500 mg via INTRAVENOUS
  Filled 2018-11-11: qty 100

## 2018-11-11 MED ORDER — SODIUM CHLORIDE 0.9 % IV BOLUS (SEPSIS)
250.0000 mL | Freq: Once | INTRAVENOUS | Status: AC
  Administered 2018-11-11: 250 mL via INTRAVENOUS

## 2018-11-11 MED ORDER — SODIUM CHLORIDE 0.9 % IV SOLN
1.0000 g | INTRAVENOUS | Status: DC
  Filled 2018-11-11: qty 1

## 2018-11-11 MED ORDER — VANCOMYCIN VARIABLE DOSE PER UNSTABLE RENAL FUNCTION (PHARMACIST DOSING): Status: DC

## 2018-11-11 MED ORDER — VANCOMYCIN HCL IN DEXTROSE 1-5 GM/200ML-% IV SOLN
1000.0000 mg | Freq: Once | INTRAVENOUS | Status: AC
  Administered 2018-11-11: 1000 mg via INTRAVENOUS
  Filled 2018-11-11: qty 200

## 2018-11-11 MED ORDER — SODIUM CHLORIDE 0.9 % IV SOLN
2.0000 g | Freq: Once | INTRAVENOUS | Status: DC
  Filled 2018-11-11: qty 2

## 2018-11-11 MED ORDER — SODIUM CHLORIDE 0.9 % IV BOLUS (SEPSIS)
1000.0000 mL | Freq: Once | INTRAVENOUS | Status: AC
  Administered 2018-11-11: 1000 mL via INTRAVENOUS

## 2018-11-11 MED ORDER — SODIUM CHLORIDE 0.9 % IV BOLUS
1000.0000 mL | Freq: Once | INTRAVENOUS | Status: AC
  Administered 2018-11-12: 1000 mL via INTRAVENOUS

## 2018-11-11 NOTE — Progress Notes (Signed)
Pharmacy Antibiotic Note  Michelle Gaines is a 85 y.o. female admitted on 11/03/2018 with sepsis.  Pharmacy has been consulted for aztreonam/vancomycin dosing. Patient has tolerated cefazolin in the past, will transition to cefepime. Lactic acid 7.1, WBC 31.4. Scr 2.01 (baseline 0.9-1), CrCl ~ 12 mL/min.   Vancomycin 500 mg IV Q 48 hrs. Goal AUC 400-550. Expected AUC: 589 SCr used: 2.01  Plan: Vancomycin 1000 mg IV x1. Given above projection utilizing low dose vancomycin scheduled. Will guide future doses based on renal function and levels Cefepime 1g IV q24h Monitor clinical status, cultures, renal function, and length of therapy   Height: 5\' 2"  (157.5 cm) Weight: 88 lb 13.5 oz (40.3 kg) IBW/kg (Calculated) : 50.1  Temp (24hrs), Avg:92.6 F (33.7 C), Min:92.6 F (33.7 C), Max:92.6 F (33.7 C)  Recent Labs  Lab 11/22/2018 1650  WBC 31.4*  CREATININE 2.01*  LATICACIDVEN 7.1*    Estimated Creatinine Clearance: 12.3 mL/min (A) (by C-G formula based on SCr of 2.01 mg/dL (H)).    Allergies  Allergen Reactions  . Fexofenadine Shortness Of Breath  . Cephalosporins Rash  . Diltiazem Other (See Comments)    Dizziness   . Penicillins Nausea And Vomiting, Swelling and Rash  . Amlodipine Other (See Comments)  . Anaprox [Naproxen Sodium] Other (See Comments)  . Benzonatate Other (See Comments)  . Cephalexin Other (See Comments)  . Clindamycin/Lincomycin Other (See Comments)  . Clorazepate Other (See Comments)  . Codeine Other (See Comments)  . Doxycycline Other (See Comments)  . Hydralazine Other (See Comments)  . Labetalol Other (See Comments)  . Lisinopril Other (See Comments)  . Phenylephrine Other (See Comments)  . Phenytoin Other (See Comments)  . Zolpidem Other (See Comments)    Antimicrobials this admission: Vancomycin 5/19 >> Cefepime 5/19 >> Flagyl 5/19 >>  Dose adjustments this admission:   Microbiology results: 5/19 BCx: 5/19 UCx: 5/19 COVID: negative    Thank you for allowing pharmacy to be a part of this patient's care.  Claiborne Billings, PharmD PGY2 Cardiology Pharmacy Resident Please check AMION for all Pharmacist numbers by unit 11/11/2018 6:53 PM

## 2018-11-11 NOTE — H&P (Addendum)
[facility] Admission History and Physical Service Pager: [phone]  Patient name: Michelle Gaines: [id number] Date of birth: [birth year] Age: 85 y.o. Gender: female  Primary Care Provider: Levin Erp, MD Consultants: none Code Status: DNR  Chief Complaint: hypothermia, AMS  Assessment and Plan: Michelle Gaines is a 85 y.o. female presenting with hypothermia, obtundation . PMH is significant for recent GI bleed, constipation, urinary retention, HTN, PVD, a-flutter, malnutrition.   Sepsis likely 2/2 sacral wound - pt arrived from her SNF with AMS. History via ED provider and from SNF appears to be a 1 day history of mental status change. She has a lactic acidosis of 7.2, which increased on repeat.  She was hypothermic to 92.6 degrees.  BP and HR remain stable though per SNF records had low BP 90/68 prior to ED arrival.  WBC was 31.4.  She has received 1219ml bolus NS with an additional 1L.  Blood and urine cultures drawn.  Likely source of infection is her sacral ulcer, which is malodorous and appears to be infected on exam. It is possible she has a UTI, but UA is not convincing with Leukocyte Esterase but no nitrites and few bacteria.  Abdominal infection is also possible given her recent history of GI bleed (not currently bleeding) and complaints of abdominal pain to her son with apparent abdominal tenderness on exam.  CT scan showed fecal impaction and stercoral colitis. Small pleural effusions but no signs of PNA on chest xray.  CT head negative for acute changes.  CCM has been notified of potential of transfer to ICU.  Patient is DNR.  Will give broad spec abx and continue fluids while monitoring for improvement in her condition.  Discussed poor prognosis with son, will work on getting visitation for son. - admit to inpatient, progressive, Dr. Nori Riis attending.   - continue vanc, cefepime, flagyl  - s/p 2.25 L bolus.  Continue 33ml/hr NS -  trend LA - AM CBC, CMP, PCT, PT-INR, cortisol - continuous cardiac monitor and pulse ox.  - vitals per routine - strict I/O - viral hep panel - gi panel w/ c. diff - consult to wound care - consult to palliative care - NPO - continue warming blanket and monitor temp and WBC - am cortisol - procalcitonin  Elevated LFT - AST 3718, ALT 2466.  Previously normal LFTs on last admission. T bili 1.9.  Likely 2/2 shock liver related to her sepsis.  Although currently hypertensive, her nurse at [facility] stated she had trouble reading her bp earlier today d/t being so low. Systolic was <17 on 3rd try at [facility].  Hepatic Venous obstruction such as budd-chiari could be a possibility given that she also has a clot in her L upper extremity but CT abd does not show any abnormalities of the liver.  - f/u viral hep panel - consider additional imaging if not improving w/ fluids/abx - avoid tylenol.   LUE DVT - seen on u/s at her SNF. Ordered d/t edema of her left arm  Extends up to the jugular. Patient on renally dosed lovenox for dvt prophylaxis.  Chose not to start full DVT anticoagulation d/t recent bleeding history and patient's past refusal of anticoagulation. No acute abnormalities seen on CT head.  - continue DVT prophylaxis - holding full pharmacologic anticoagulation w/ doac.   HTN - hypertensive on admission despite being severely septic.  Holding any antihypertensive medications d/t her current sepsis and AKI - monitor BP  AKI. Cr 2.01 up from baseline 1.0. Likely prerenal in the setting of sepsis and report hypotension at SNF - aggressive hydration - monitor Cr - hold home BP meds  PVD -  Has external iliac to profunda bypass in 2009.  ABI of 0.58 on right and 0.54 on left last September.  - hold ASA  Hx of a-flutter s/p ablation - following with cardiologist w/ yearly checkups.  Ablation in 2014.  Refusing systemic anticoagulation.  Intolerant of beta blockers.  - continuous  cardiac monitor  Constipation - stool impaction seen in sigmoid colon on CT scan.  Chronic problem for this patient.  She has been complaining of abdominal pain, per her son.  - holding meds for now given mental status.  - consider enema/bowel regimen as she improves. - lidoderm patch for abdominal pain. Avoiding NSAIDs, tylenol. Can consider morphine as her mental status/vital signs improve.    Protein calorie malnutrition - extremely frail. Albumin 2.5 - when patient tolerating PO can add nutritional supplements and consider nutrition consult.   FEN/GI: 49ml/hr NS, NPO Prophylaxis: lovenox  Disposition: progressive care  History of Present Illness:  Michelle Gaines is a 85 y.o. female presenting from the SNF at [facility] with somnolence and hypothermia.   Unable to obtain history from patient d/t her present condition.   Patient repeatedly only able to say "please help me"  Per the nurse at [facility], she saw the patient about 15 minutes before she was sent to the ED and stated she was very lethargic and it was difficult to get a blood pressure reading because it was so low.  She worked with the patient two days ago and said the patient was acting/mentating normally at that time.  At baseline the patient does not have much appetite and has little activity.  She is maximal assist to transfer from bed.    Spoke with the patient's son who said the patient' hasn't been in 'good spirits' lately. He talked with her the day before yesterday.  He states she has been complaining of abdominal pain/swelling and pain when she eats since her last admission.  He has not been able to see her in 3 weeks d/t covid restrictions.      Review Of Systems: Per HPI with the following additions:   Review of Systems  Unable to perform ROS: Mental status change    Patient Active Problem List   Diagnosis Date Noted  . Macrocytic anemia   . GI bleed 10/23/2018  . Pressure injury of skin 10/23/2018  .  Fecal impaction ([place])   . Rectal bleeding   . Ganglion cyst of dorsum of right wrist 07/18/2017  . Closed nondisplaced fracture of proximal phalanx of right great toe 07/18/2017  . Ankle fracture 10/13/2015  . Closed left ankle fracture 10/12/2015  . GERD (gastroesophageal reflux disease) 10/12/2015  . Anxiety 10/12/2015  . Fall   . Hip fracture ([place]) 07/28/2014  . Protein-calorie malnutrition, severe ([place]) 07/28/2014  . Dizziness 04/24/2012  . Restless leg 03/15/2011  . Atrial flutter ([place]) 08/17/2010  . Essential hypertension 08/16/2010  . Coronary atherosclerosis 08/16/2010  . PVD 08/16/2010    Past Medical History: Past Medical History:  Diagnosis Date  . Ankle fracture, left 09/2015  . Arthritis   . Atrial flutter ([place])   . Cancer ([place])    OVARIAN    1967  . Coronary artery disease   . Dysrhythmia    ATRIAL FLUTTER  . GERD (gastroesophageal reflux disease)   .  Hypertension   . Peripheral arterial disease ([place])   . PVD (peripheral vascular disease) ([place])     Past Surgical History: Past Surgical History:  Procedure Laterality Date  . ABDOMINAL HYSTERECTOMY    . ATRIAL FLUTTER ABLATION N/A 05/22/2013   Procedure: ATRIAL FLUTTER ABLATION;  Surgeon: Evans Lance, MD;  Location: [facility] CATH LAB;  Service: Cardiovascular;  Laterality: N/A;  . BPG    . CAROTID STENT  11/30/09   LAD S/P Right CA DES  . FRACTURE SURGERY Left Feb. 2, 2016   Left Hip Fx   . GLIAYTE CATHERTER INSERTION  06/27/10  . INTRAMEDULLARY (IM) NAIL INTERTROCHANTERIC Left 07/28/2014   Procedure: Left Affixus Nail;  Surgeon: Marybelle Killings, MD;  Location: [facility];  Service: Orthopedics;  Laterality: Left;  . PR VEIN BYPASS GRAFT,AORTO-FEM-POP     Right     Social History: Social History   Tobacco Use  . Smoking status: Never Smoker  . Smokeless tobacco: Never Used  Substance Use Topics  . Alcohol use: No  . Drug use: No   Additional social history: From [facility].  Please also refer to  relevant sections of EMR.  Family History: Family History  Problem Relation Age of Onset  . COPD Father   . Hypertension Father      Allergies and Medications: Allergies  Allergen Reactions  . Fexofenadine Shortness Of Breath  . Cephalosporins Rash  . Diltiazem Other (See Comments)    Dizziness   . Penicillins Nausea And Vomiting, Swelling and Rash  . Amlodipine Other (See Comments)  . Anaprox [Naproxen Sodium] Other (See Comments)  . Benzonatate Other (See Comments)  . Cephalexin Other (See Comments)  . Clindamycin/Lincomycin Other (See Comments)  . Clorazepate Other (See Comments)  . Codeine Other (See Comments)  . Doxycycline Other (See Comments)  . Hydralazine Other (See Comments)  . Labetalol Other (See Comments)  . Lisinopril Other (See Comments)  . Phenylephrine Other (See Comments)  . Phenytoin Other (See Comments)  . Zolpidem Other (See Comments)   No current facility-administered medications on file prior to encounter.    Current Outpatient Medications on File Prior to Encounter  Medication Sig Dispense Refill  . cholecalciferol (VITAMIN D3) 25 MCG (1000 UT) tablet Take 1,000 Units by mouth daily.    . diclofenac sodium (VOLTAREN) 1 % GEL Apply 2 g topically 4 (four) times daily.    Marland Kitchen losartan (COZAAR) 50 MG tablet Take 50 mg by mouth daily.    . multivitamin (THERAGRAN) per tablet Take 1 tablet by mouth daily.      . nitroGLYCERIN (NITROSTAT) 0.4 MG SL tablet Place 1 tablet (0.4 mg total) under the tongue every 5 (five) minutes as needed for chest pain. 25 tablet 12  . polyethylene glycol (MIRALAX / GLYCOLAX) 17 g packet Take 17 g by mouth 2 (two) times daily. 14 each 0  . senna (SENOKOT) 8.6 MG TABS tablet Take 1 tablet (8.6 mg total) by mouth daily. 120 each 0    Objective: BP (!) 183/82   Pulse 86   Temp (!) 93.3 F (34.1 C) (Rectal)   Resp (!) 31   Ht 5\' 2"  (1.575 m)   Wt 40.3 kg   SpO2 99%   BMI 16.25 kg/m  Exam: General: Altered and laying  in bed in mild distress. Responds minimally to voice.  Eyes: PERRL. No scleral icterus.  ENTM: oral mucosa dry  Neck: no cervical LAD Cardiovascular: irregular rhythm.  Normal rate.  2/6 Systolic murmur.  Respiratory: LCTAB, anteriorly.  No wheezes or crackles.  Gastrointestinal: bowel sounds present.  Patient reacts to palpation diffusely.   MSK: Patient frail/cachectic Extremities: left arm has 3+ pitting edema going up past the elbow.  Derm: approximate 6" shallow abrasion on lateral side of left shin, appears to be new. No sign of infection without erythema or drainage.  Has approximately 4-5cm stage 3 sacral ulcer with eschar surrounded by ring of blackened tissue.  Malodorous smell. No crepitus   Neuro: Awake. Not oriented. Moves all extremities.  Psych: patient mumbling incoherently for the most part. Only understandable words are 'please forgive me'.    Labs and Imaging: CBC  Recent Labs  Lab 11/07/2018 1650  WBC 31.4*  HGB 10.5*  HCT 33.7*  PLT 104*     CMP Latest Ref Rng & Units 11/21/2018  Glucose 70 - 99 mg/dL 131(H)  BUN 8 - 23 mg/dL 54(H)  Creatinine 0.44 - 1.00 mg/dL 2.01(H)  Sodium 135 - 145 mmol/L 141  Potassium 3.5 - 5.1 mmol/L 4.6  Chloride 98 - 111 mmol/L 106  CO2 22 - 32 mmol/L 16(L)  Calcium 8.9 - 10.3 mg/dL 8.3(L)  Total Protein 6.5 - 8.1 g/dL 5.8(L)  Total Bilirubin 0.3 - 1.2 mg/dL 1.9(H)  Alkaline Phos 38 - 126 U/L 188(H)  AST 15 - 41 U/L 3,718(H)  ALT 0 - 44 U/L 2,466(H)     Lactic Acid 7.1>7.2> 7.7  Urinalysis    Component Value Date/Time   COLORURINE AMBER (A) 11/03/2018 2030   APPEARANCEUR CLOUDY (A) 11/22/2018 2030   LABSPEC 1.016 11/19/2018 2030   PHURINE 5.0 11/05/2018 2030   [place] 11/18/2018 2030   HGBUR MODERATE (A) 10/26/2018 2030   BILIRUBINUR NEGATIVE 11/01/2018 2030   [place] 11/10/2018 2030   PROTEINUR 100 (A) 11/05/2018 2030   UROBILINOGEN 0.2 07/28/2014 0047   NITRITE NEGATIVE 11/17/2018 2030    LEUKOCYTESUR LARGE (A) 11/21/2018 2030      CT abd/pelvis: 1. Fecal impaction with large volume of stool in the sigmoid colon and rectum. Wall thickening of the rectum compatible with mild stercoral colitis. 2. Small right and moderate left pleural effusions. 3. Moderate L3 subacute compression deformity and T9 moderate to severe chronic compression deformities.  CT head: 1. No acute intracranial abnormality. Evolved left thalamic lacunar infarcts since 2019. 2. New opacification of the right tympanic cavity and mastoid suggesting otitis media with mastoid effusion.  CXR: Bilateral pleural effusions, left greater than right with probable associated basilar atelectasis.  LUE ultrasound at her SNF showing DVT extending to the jugular.    Benay Pike, MD 11/10/2018, 9:41 PM PGY-1, [place] Intern pager: [phone], text pages welcome  FPTS Upper-Level Resident Addendum  I have independently interviewed and examined the patient. I have discussed the above with the original author and agree with their documentation. My edits for correction/addition/clarification are in blue. Please see also any attending notes.   Bufford Lope, DO PGY-3, [facility] Nov 15, 2018 6:20 AM  FPTS Service pager: [phone] (text pages welcome through [facility])

## 2018-11-11 NOTE — ED Notes (Signed)
Critical Lactic Acid of 7.1.

## 2018-11-11 NOTE — ED Notes (Signed)
Rectal temp rechecked after 2 hrs on bear hugger at medium. Temp 93.3 rectally. Bear hugger turned to high. Pt is more responsive and alter, however confused and repeats "amy im so sorry please help me". Pt unable to voice what her specific needs are.

## 2018-11-11 NOTE — ED Provider Notes (Signed)
Emergency Department Provider Note   I have reviewed the triage vital signs and the nursing notes.   HISTORY  Chief Complaint Altered Mental Status   HPI Michelle Gaines is a 85 y.o. female with PMH of  HTN, PAD, GERD, and recent admit for GI bleeding presents to the emergency department for evaluation of altered mental status by EMS.  Patient comes from the [facility]. Patient arrives with DNR paperwork. No MOST form. Level 5 caveat applies as the patient is unable to provide additional history.   According to EMS the patient has been experiencing generalized weakness and somnolence which is worse than prior.  Staff last saw her well at 11 AM today.  She arrives with a report showing left upper extremity ultrasound done today showing clot in the left jugular and axillary veins.  No anticoagulation is listed on the patient's chart.   Patient tells me that she is having abdominal pain but cannot provide additional detail.   CODE STATUS: DNR/DNI confirmed by Son  Past Medical History:  Diagnosis Date   Ankle fracture, left 09/2015   Arthritis    Atrial flutter ([place])    Cancer ([place])    OVARIAN    1967   Coronary artery disease    Dysrhythmia    ATRIAL FLUTTER   GERD (gastroesophageal reflux disease)    Hypertension    Peripheral arterial disease (HCC)    PVD (peripheral vascular disease) ([place])     Patient Active Problem List   Diagnosis Date Noted   Sepsis ([place]) 11/16/2018   Macrocytic anemia    GI bleed 10/23/2018   Pressure ulcer, stage 3 ([place]) 10/23/2018   Fecal impaction ([place])    Rectal bleeding    Ganglion cyst of dorsum of right wrist 07/18/2017   Closed nondisplaced fracture of proximal phalanx of right great toe 07/18/2017   Ankle fracture 10/13/2015   Closed left ankle fracture 10/12/2015   GERD (gastroesophageal reflux disease) 10/12/2015   Anxiety 10/12/2015   Fall    Hip fracture ([place]) 07/28/2014   Protein-calorie  malnutrition, severe ([place]) 07/28/2014   Dizziness 04/24/2012   Restless leg 03/15/2011   Atrial flutter ([place]) 08/17/2010   Essential hypertension 08/16/2010   Coronary atherosclerosis 08/16/2010   PVD 08/16/2010    Past Surgical History:  Procedure Laterality Date   ABDOMINAL HYSTERECTOMY     ATRIAL FLUTTER ABLATION N/A 05/22/2013   Procedure: ATRIAL FLUTTER ABLATION;  Surgeon: Evans Lance, MD;  Location: [facility] CATH LAB;  Service: Cardiovascular;  Laterality: N/A;   BPG     CAROTID STENT  11/30/09   LAD S/P Right CA DES   FRACTURE SURGERY Left Feb. 2, 2016   Left Hip Fx    GLIAYTE CATHERTER INSERTION  06/27/10   INTRAMEDULLARY (IM) NAIL INTERTROCHANTERIC Left 07/28/2014   Procedure: Left Affixus Nail;  Surgeon: Marybelle Killings, MD;  Location: [facility];  Service: Orthopedics;  Laterality: Left;   PR VEIN BYPASS GRAFT,AORTO-FEM-POP     Right     Allergies Fexofenadine; Cephalosporins; Diltiazem; Penicillins; Amlodipine; Anaprox [naproxen sodium]; Benzonatate; Cephalexin; Clindamycin/lincomycin; Clorazepate; Codeine; Doxycycline; Hydralazine; Labetalol; Lisinopril; Phenylephrine; Phenytoin; and Zolpidem  Family History  Problem Relation Age of Onset   COPD Father    Hypertension Father     Social History Social History   Tobacco Use   Smoking status: Never Smoker   Smokeless tobacco: Never Used  Substance Use Topics   Alcohol use: No   Drug use: No  Review of Systems  Level 5 caveat: AMS  ____________________________________________   PHYSICAL EXAM:  VITAL SIGNS: ED Triage Vitals  Enc Vitals Group     BP 11/07/2018 1635 (!) 170/107     Pulse Rate 11/23/2018 1642 75     Resp 11/09/2018 1642 14     Temp 11/10/2018 1642 (!) 92.6 F (33.7 C)     Temp Source 11/23/2018 1642 Rectal     SpO2 10/24/2018 1634 94 %     Weight 11/16/2018 1637 88 lb 13.5 oz (40.3 kg)     Height 11/08/2018 1637 5\' 2"  (1.575 m)     Pain Score 11/11/18 1636 Asleep   Constitutional: Opens  eyes to voice and tells me her name.  Eyes: Conjunctivae are normal. PERRL. Head: Atraumatic. Nose: No congestion/rhinnorhea. Mouth/Throat: Mucous membranes are dry. Oropharynx non-erythematous. Neck: No stridor.   Cardiovascular: Normal rate, regular rhythm. Good peripheral circulation. Grossly normal heart sounds.   Respiratory: Normal respiratory effort.  No retractions. Lungs CTAB. Gastrointestinal: Soft and nontender. No distention.  Musculoskeletal: No lower extremity tenderness nor edema. No gross deformities of extremities. Neurologic:  Normal speech and language. No gross focal neurologic deficits are appreciated.  Skin:  Skin is warm and dry. Sacral decubitus ulcers and skin breakdown to left leg and ankle.    ____________________________________________   LABS (all labs ordered are listed, but only abnormal results are displayed)  Labs Reviewed  LACTIC ACID, PLASMA - Abnormal; Notable for the following components:      Result Value   Lactic Acid, Venous 7.1 (*)    All other components within normal limits  LACTIC ACID, PLASMA - Abnormal; Notable for the following components:   Lactic Acid, Venous 7.2 (*)    All other components within normal limits  COMPREHENSIVE METABOLIC PANEL - Abnormal; Notable for the following components:   CO2 16 (*)    Glucose, Bld 131 (*)    BUN 54 (*)    Creatinine, Ser 2.01 (*)    Calcium 8.3 (*)    Total Protein 5.8 (*)    Albumin 2.5 (*)    AST 3,718 (*)    ALT 2,466 (*)    Alkaline Phosphatase 188 (*)    Total Bilirubin 1.9 (*)    GFR calc non Af Amer 22 (*)    GFR calc Af Amer 25 (*)    Anion gap 19 (*)    All other components within normal limits  CBC WITH DIFFERENTIAL/PLATELET - Abnormal; Notable for the following components:   WBC 31.4 (*)    RBC 3.42 (*)    Hemoglobin 10.5 (*)    HCT 33.7 (*)    Platelets 104 (*)    nRBC 0.7 (*)    Neutro Abs 29.5 (*)    Lymphs Abs 0.5 (*)    Basophils Absolute 0.2 (*)    Abs Immature  Granulocytes 0.76 (*)    All other components within normal limits  URINALYSIS, ROUTINE W REFLEX MICROSCOPIC - Abnormal; Notable for the following components:   Color, Urine AMBER (*)    APPearance CLOUDY (*)    Hgb urine dipstick MODERATE (*)    Protein, ur 100 (*)    Leukocytes,Ua LARGE (*)    Bacteria, UA FEW (*)    All other components within normal limits  LACTIC ACID, PLASMA - Abnormal; Notable for the following components:   Lactic Acid, Venous 7.7 (*)    All other components within normal limits  LACTIC ACID, PLASMA -  Abnormal; Notable for the following components:   Lactic Acid, Venous 9.8 (*)    All other components within normal limits  PROTIME-INR - Abnormal; Notable for the following components:   Prothrombin Time 29.9 (*)    INR 2.9 (*)    All other components within normal limits  CORTISOL-AM, BLOOD - Abnormal; Notable for the following components:   Cortisol - AM >100.0 (*)    All other components within normal limits  COMPREHENSIVE METABOLIC PANEL - Abnormal; Notable for the following components:   CO2 11 (*)    Glucose, Bld 67 (*)    BUN 58 (*)    Creatinine, Ser 2.21 (*)    Calcium 7.3 (*)    Total Protein 4.5 (*)    Albumin 2.1 (*)    AST 2,289 (*)    ALT 2,007 (*)    Alkaline Phosphatase 177 (*)    Total Bilirubin 1.8 (*)    GFR calc non Af Amer 19 (*)    GFR calc Af Amer 22 (*)    Anion gap 19 (*)    All other components within normal limits  CBC - Abnormal; Notable for the following components:   WBC 41.1 (*)    RBC 2.80 (*)    Hemoglobin 8.5 (*)    HCT 27.5 (*)    Platelets 81 (*)    nRBC 2.1 (*)    All other components within normal limits  LACTIC ACID, PLASMA - Abnormal; Notable for the following components:   Lactic Acid, Venous >11.0 (*)    All other components within normal limits  CBG MONITORING, ED - Abnormal; Notable for the following components:   Glucose-Capillary 110 (*)    All other components within normal limits  CULTURE,  BLOOD (ROUTINE X 2)  SARS CORONAVIRUS 2 (HOSPITAL ORDER, [place] [facility])  C DIFFICILE QUICK SCREEN W PCR REFLEX  CULTURE, BLOOD (ROUTINE X 2)  URINE CULTURE  GASTROINTESTINAL PANEL BY PCR, STOOL (REPLACES STOOL CULTURE)  LIPASE, BLOOD  PROCALCITONIN  HEPATITIS PANEL, ACUTE  LACTIC ACID, PLASMA  LACTIC ACID, PLASMA  TYPE AND SCREEN   ____________________________________________  EKG   EKG Interpretation  Date/Time:  Tuesday Nov 11 2018 16:38:38 EDT Ventricular Rate:  64 PR Interval:    QRS Duration: 148 QT Interval:  513 QTC Calculation: 530 R Axis:   76 Text Interpretation:  Sinus rhythm Atrial premature complex Right bundle branch block No STEMI.  Confirmed by Nanda Quinton [phone]) on 11/09/2018 5:04:59 PM       ____________________________________________  RADIOLOGY  Ct Abdomen Pelvis Wo Contrast  Result Date: 10/28/2018 CLINICAL DATA:  85 y/o F; altered mental status, known DVT in the left jugular 2 axillary, multiple wounds to sacrum, left leg, left heel. Abdominal pain. EXAM: CT ABDOMEN AND PELVIS WITHOUT CONTRAST TECHNIQUE: Multidetector CT imaging of the abdomen and pelvis was performed following the standard protocol without IV contrast. COMPARISON:  11/29/2009 CT abdomen and pelvis. 11/10/2018 chest radiograph. 10/22/2018 abdomen radiographs. FINDINGS: Lower chest: Small right and moderate left pleural effusions with dependent lower lobe opacities, likely associated atelectasis. Cardiomegaly. Coronary artery and aortic calcific atherosclerosis. Hepatobiliary: No focal liver abnormality is seen. No gallstones, gallbladder wall thickening, or biliary dilatation. Pancreas: Unremarkable. No pancreatic ductal dilatation or surrounding inflammatory changes. Spleen: Normal in size without focal abnormality. Adrenals/Urinary Tract: Adrenal glands are unremarkable. Kidney cysts measuring up to 4.3 cm in the upper pole of right kidney. Otherwise kidneys are  normal, without renal calculi,  focal lesion, or hydronephrosis. Bladder is collapsed around a Foley catheter. Stomach/Bowel: No obstructive or inflammatory changes of the stomach or small bowel. There is a large volume of stool within the sigmoid colon and rectum. There is wall thickening of the rectum. Vascular/Lymphatic: Aortic atherosclerosis. No enlarged abdominal or pelvic lymph nodes. Reproductive: Status post hysterectomy. No adnexal masses. Other: No abdominal wall hernia or abnormality. No abdominopelvic ascites. Musculoskeletal: Multiple right anterior chronic rib fractures. Chronic fracture deformity of the right inferior pubic ramus. Partially visualized left femur intramedullary nail and proximal lag screw. Bones are diffusely demineralized. Stable moderate to severe T9 compression deformity from prior chest radiograph. Stable moderate L3 compression deformity from prior abdomen radiographs, new from 2018. There is a persistent visible fracture line indicating probable scratch subacute etiology. IMPRESSION: 1. Fecal impaction with large volume of stool in the sigmoid colon and rectum. Wall thickening of the rectum compatible with mild stercoral colitis. 2. Small right and moderate left pleural effusions. 3. Moderate L3 subacute compression deformity and T9 moderate to severe chronic compression deformities. Electronically Signed   By: Kristine Garbe M.D.   On: 11/23/2018 20:30   Ct Head Wo Contrast  Result Date: 11/10/2018 CLINICAL DATA:  85 year old female with altered mental status. EXAM: CT HEAD WITHOUT CONTRAST TECHNIQUE: Contiguous axial images were obtained from the base of the skull through the vertex without intravenous contrast. COMPARISON:  Head CT 03/12/2018. FINDINGS: Brain: Increased hypodensity in the left thalamus on series 2, image 16 was apparent on the prior study. Elsewhere Stable gray-white matter differentiation throughout the brain. No midline shift, ventriculomegaly,  mass effect, evidence of mass lesion, intracranial hemorrhage or evidence of cortically based acute infarction. Vascular: Calcified atherosclerosis at the skull base. No suspicious intracranial vascular hyperdensity. Skull: No acute osseous abnormality identified. Sinuses/Orbits: New right tympanic cavity opacification and mastoid fluid, with a mastoid fluid level visible on series 3, image 15. The contralateral left tympanic cavity and mastoids remain clear. Negative visible nasopharynx. Paranasal sinuses remain clear. Other: Calcified scalp vessel atherosclerosis. No acute orbit or scalp soft tissue findings. IMPRESSION: 1. No acute intracranial abnormality. Evolved left thalamic lacunar infarcts since 2019. 2. New opacification of the right tympanic cavity and mastoid suggesting otitis media with mastoid effusion. Electronically Signed   By: Genevie Ann M.D.   On: 11/22/2018 20:11   Dg Chest Port 1 View  Result Date: 11/18/2018 CLINICAL DATA:  Altered mental status. EXAM: PORTABLE CHEST 1 VIEW COMPARISON:  10/22/2018 FINDINGS: 1657 hours. Lungs are hyperexpanded. Moderate left pleural effusion associated with small right pleural effusion. The cardio pericardial silhouette is enlarged. Bones are diffusely demineralized. Telemetry leads overlie the chest. IMPRESSION: Bilateral pleural effusions, left greater than right with probable associated basilar atelectasis. Electronically Signed   By: Misty Stanley M.D.   On: 11/11/2018 18:18    ____________________________________________   PROCEDURES  Procedure(s) performed:   Procedures  CRITICAL CARE Performed by: Margette Fast Total critical care time: 45 minutes Critical care time was exclusive of separately billable procedures and treating other patients. Critical care was necessary to treat or prevent imminent or life-threatening deterioration. Critical care was time spent personally by me on the following activities: development of treatment plan  with patient and/or surrogate as well as nursing, discussions with consultants, evaluation of patient's response to treatment, examination of patient, obtaining history from patient or surrogate, ordering and performing treatments and interventions, ordering and review of laboratory studies, ordering and review of radiographic studies, pulse oximetry and re-evaluation  of patient's condition.  Nanda Quinton, MD Emergency Medicine  ____________________________________________   INITIAL IMPRESSION / ASSESSMENT AND PLAN / ED COURSE  Pertinent labs & imaging results that were available during my care of the patient were reviewed by me and considered in my medical decision making (see chart for details).   Patient arrives from a skilled nursing facility with altered mental status.  Apparently had left upper extremity DVT extending to the jugular diagnosed today.  I do not appreciate any focal neurologic deficits.  The patient seems more globally weak and encephalopathic.  She describes abdominal pain.  She was admitted earlier this month for GI bleeding and constipation.  She was managed with bowel regimen and monitoring.  No additional bleeding while inpatient.  She did have a spike in her WBC count but no evidence of infection at that time.  Patient does have an indwelling Foley catheter following acute urinary retention earlier this month has remained in place since discharge.  She has multiple possible sources for infection here including indwelling Foley, sacral decubitus wound, and wounds over the left leg.  Patient is hypothermic on arrival with elevated blood pressure.  No tachycardia.  06:00 PM  Patient's lactic acidosis has resulted at 7. WBC count > 30.  I have started broad-spectrum antibiotics and 30 mL/kg bolus is initiated.  I have added on a CT abdomen pelvis with IV contrast to evaluate for ischemic colitis but suspicion is lower for this. CT head also pending.   06:30 PM  Updated  patient's son by phone.  He confirms the patient's DNR/DNI status.  He was under the impression that she was having some left arm swelling which prompted the right upper extremity ultrasound.  He was also under the impression that she may have a UTI but does not believe she was being treated with antibiotics at that time.  Otherwise, he has not seen her in several weeks due to COVID-19 pandemic and visitor restrictions.   CT imaging and plain films along with labs reviewed.   Discussed patient's case with Medicine to request admission. Patient and family (if present) updated with plan. Care transferred to Medicine service.  I reviewed all nursing notes, vitals, pertinent old records, EKGs, labs, imaging (as available).  ____________________________________________  FINAL CLINICAL IMPRESSION(S) / ED DIAGNOSES  Final diagnoses:  Sepsis with acute renal failure without septic shock, due to unspecified organism, unspecified acute renal failure type ([place])  Somnolence     MEDICATIONS GIVEN DURING THIS VISIT:  Medications  ceFEPIme (MAXIPIME) 1 g in sodium chloride 0.9 % 100 mL IVPB (has no administration in time range)  vancomycin variable dose per unstable renal function (pharmacist dosing) (has no administration in time range)  enoxaparin (LOVENOX) injection 30 mg (30 mg Subcutaneous Given 11-16-18 0135)  0.9 %  sodium chloride infusion ( Intravenous New Bag/Given 11/16/18 0128)  metroNIDAZOLE (FLAGYL) IVPB 500 mg (500 mg Intravenous New Bag/Given Nov 16, 2018 0320)  lidocaine (LIDODERM) 5 % 1 patch (1 patch Transdermal Patch Applied 11-16-2018 0133)  MEDLINE mouth rinse (has no administration in time range)  metroNIDAZOLE (FLAGYL) IVPB 500 mg (0 mg Intravenous Stopped 11/08/2018 1833)  vancomycin (VANCOCIN) IVPB 1000 mg/200 mL premix (0 mg Intravenous Stopped 11/15/2018 1904)  sodium chloride 0.9 % bolus 1,000 mL (0 mLs Intravenous Stopped 10/25/2018 1904)    And  sodium chloride 0.9 % bolus 250 mL (0 mLs  Intravenous Stopped 11/08/2018 1904)  ceFEPIme (MAXIPIME) 2 g in sodium chloride 0.9 % 100 mL IVPB (0  g Intravenous Stopped 11/23/2018 1904)  sodium chloride 0.9 % bolus 1,000 mL (1,000 mLs Intravenous New Bag/Given 11-24-18 0134)  sodium chloride 0.9 % bolus 1,000 mL (1,000 mLs Intravenous New Bag/Given 24-Nov-2018 0973)     Note:  This document was prepared using Dragon voice recognition software and may include unintentional dictation errors.  Nanda Quinton, MD Emergency Medicine    Keeleigh Terris, [facility], MD 2018-11-24 [phone]

## 2018-11-11 NOTE — ED Triage Notes (Addendum)
Pt BIB [facility] from [facility] with AMS, known DVT in left jugular to axilary Multiple wounds to sacrum, left leg, left heel A/O x 3 (unknown place), very weak at baseline and nonambulatory LKN per staff at facility 11:00 am today CBG 164 18 RFA 160/80 HR 80s NSR Cap18-20 94% RA 96.8 oral temp DNR Foley present with tea colored urine

## 2018-11-12 DIAGNOSIS — R6520 Severe sepsis without septic shock: Secondary | ICD-10-CM

## 2018-11-12 DIAGNOSIS — L89153 Pressure ulcer of sacral region, stage 3: Secondary | ICD-10-CM

## 2018-11-12 DIAGNOSIS — A419 Sepsis, unspecified organism: Principal | ICD-10-CM

## 2018-11-12 DIAGNOSIS — K7200 Acute and subacute hepatic failure without coma: Secondary | ICD-10-CM

## 2018-11-12 LAB — COMPREHENSIVE METABOLIC PANEL
ALT: 2007 U/L — ABNORMAL HIGH (ref 0–44)
AST: 2289 U/L — ABNORMAL HIGH (ref 15–41)
Albumin: 2.1 g/dL — ABNORMAL LOW (ref 3.5–5.0)
Alkaline Phosphatase: 177 U/L — ABNORMAL HIGH (ref 38–126)
Anion gap: 19 — ABNORMAL HIGH (ref 5–15)
BUN: 58 mg/dL — ABNORMAL HIGH (ref 8–23)
CO2: 11 mmol/L — ABNORMAL LOW (ref 22–32)
Calcium: 7.3 mg/dL — ABNORMAL LOW (ref 8.9–10.3)
Chloride: 110 mmol/L (ref 98–111)
Creatinine, Ser: 2.21 mg/dL — ABNORMAL HIGH (ref 0.44–1.00)
GFR calc Af Amer: 22 mL/min — ABNORMAL LOW (ref >60)
GFR calc non Af Amer: 19 mL/min — ABNORMAL LOW (ref >60)
Glucose, Bld: 67 mg/dL — ABNORMAL LOW (ref 70–99)
Potassium: 4.9 mmol/L (ref 3.5–5.1)
Sodium: 140 mmol/L (ref 135–145)
Total Bilirubin: 1.8 mg/dL — ABNORMAL HIGH (ref 0.3–1.2)
Total Protein: 4.5 g/dL — ABNORMAL LOW (ref 6.5–8.1)

## 2018-11-12 LAB — CBC
HCT: 27.5 % — ABNORMAL LOW (ref 36.0–46.0)
Hemoglobin: 8.5 g/dL — ABNORMAL LOW (ref 12.0–15.0)
MCH: 30.4 pg (ref 26.0–34.0)
MCHC: 30.9 g/dL (ref 30.0–36.0)
MCV: 98.2 fL (ref 80.0–100.0)
Platelets: 81 10*3/uL — ABNORMAL LOW (ref 150–400)
RBC: 2.8 MIL/uL — ABNORMAL LOW (ref 3.87–5.11)
RDW: 15.3 % (ref 11.5–15.5)
WBC: 41.1 10*3/uL — ABNORMAL HIGH (ref 4.0–10.5)
nRBC: 2.1 % — ABNORMAL HIGH (ref 0.0–0.2)

## 2018-11-12 LAB — C DIFFICILE QUICK SCREEN W PCR REFLEX
C Diff antigen: NEGATIVE
C Diff interpretation: NOT DETECTED
C Diff toxin: NEGATIVE

## 2018-11-12 LAB — PROCALCITONIN: Procalcitonin: 4.13 ng/mL

## 2018-11-12 LAB — LACTIC ACID, PLASMA
Lactic Acid, Venous: 9.8 mmol/L (ref 0.5–1.9)
Lactic Acid, Venous: >11 mmol/L (ref 0.5–1.9)

## 2018-11-12 LAB — CORTISOL-AM, BLOOD: Cortisol - AM: >100 ug/dL — ABNORMAL HIGH (ref 6.7–22.6)

## 2018-11-12 LAB — PROTIME-INR
INR: 2.9 — ABNORMAL HIGH (ref 0.8–1.2)
Prothrombin Time: 29.9 seconds — ABNORMAL HIGH (ref 11.4–15.2)

## 2018-11-12 MED ORDER — SODIUM CHLORIDE 0.9 % IV BOLUS
1000.0000 mL | Freq: Once | INTRAVENOUS | Status: AC
  Administered 2018-11-12: 1000 mL via INTRAVENOUS

## 2018-11-12 MED ORDER — ORAL CARE MOUTH RINSE
15.0000 mL | Freq: Two times a day (BID) | OROMUCOSAL | Status: DC
  Administered 2018-11-12: 15 mL via OROMUCOSAL

## 2018-11-12 MED ORDER — SODIUM CHLORIDE 0.9 % IV SOLN: 2.0000 g | Freq: Once | INTRAVENOUS | Status: DC

## 2018-11-12 MED ORDER — ENOXAPARIN SODIUM 30 MG/0.3ML ~~LOC~~ SOLN
30.0000 mg | SUBCUTANEOUS | Status: DC
  Administered 2018-11-12: 30 mg via SUBCUTANEOUS
  Filled 2018-11-12: qty 0.3

## 2018-11-12 MED ORDER — METRONIDAZOLE IN NACL 5-0.79 MG/ML-% IV SOLN
500.0000 mg | Freq: Three times a day (TID) | INTRAVENOUS | Status: DC
  Administered 2018-11-12: 500 mg via INTRAVENOUS
  Filled 2018-11-12: qty 100

## 2018-11-12 MED ORDER — SODIUM CHLORIDE 0.9 % IV SOLN
INTRAVENOUS | Status: DC
  Administered 2018-11-12: 01:00:00 via INTRAVENOUS

## 2018-11-12 MED ORDER — VANCOMYCIN HCL IN DEXTROSE 1-5 GM/200ML-% IV SOLN: 1000.0000 mg | Freq: Once | INTRAVENOUS | Status: DC

## 2018-11-12 MED ORDER — LIDOCAINE 5 % EX PTCH
1.0000 | MEDICATED_PATCH | Freq: Every day | CUTANEOUS | Status: DC
  Administered 2018-11-12: 1 via TRANSDERMAL
  Filled 2018-11-12: qty 1

## 2018-11-12 MED ORDER — MORPHINE SULFATE (PF) 2 MG/ML IV SOLN: 1.0000 mg | INTRAVENOUS | Status: DC | PRN

## 2018-11-13 LAB — GASTROINTESTINAL PANEL BY PCR, STOOL (REPLACES STOOL CULTURE)

## 2018-11-13 LAB — HEPATITIS PANEL, ACUTE
HCV Ab: <0.1 s/co ratio (ref 0.0–0.9)
Hep A IgM: NEGATIVE
Hep B C IgM: NEGATIVE
Hepatitis B Surface Ag: NEGATIVE

## 2018-11-15 LAB — URINE CULTURE
Culture: >=100000 — AB
Special Requests: NORMAL

## 2018-11-16 LAB — CULTURE, BLOOD (ROUTINE X 2)
Culture: NO GROWTH
Culture: NO GROWTH

## 2018-11-24 NOTE — Progress Notes (Addendum)
Family Medicine Teaching Service Daily Progress Note Intern Pager: [phone]  Patient name: Michelle Gaines record number: [id number] Date of birth: 1928/12/01 Age: 85 y.o. Gender: female  Primary Care Provider: Levin Erp, MD Consultants: None Code Status: DNR  Pt Overview and Major Events to Date:  5/19: admitted to [facility] for hypothermia and AMS  Assessment and Plan: Michelle Gaines is a 85 y.o. female presenting with hypothermia, obtundation . PMH is significant for recent GI bleed 2/2 to stercoral ulcer, constipation, urinary retention, HTN, PVD, a-flutter, malnutrition.  Sepsis with lactic acidosis: acute Etiology likely multifactorial, including CT head significant for otitis media with mastoiditis, sacral wound, and possible UTI (leukocyte esterase and few bacteria without nitrites) although not convincing. Possible abdominal infection given complaint of abdominal pain however she does have a large stool burden which could explain this. Overnight patient has remained hypothermic requiring warming blankets, improved this AM to 97.5. Wound care consulted. Lactic acid continueing to worsen, 7.1>7.7>9.8>11. Procalcitonin 4.13. WBC worsening 31.4>41.4. ANC 29.5  C. Diff negative. Cortisol elevated (>100). Patient appears very ill on exam. Vitals are stable otherwise.  - Continue IV Vanc, Cefepime, and Flagyl  - s/p 3.25L NS bolus - Continue 62mL/hr NS - continue to trend LA - follow up urine and blood cultures   - Follow up GI panel  - follow up hepatitis panel - palliative consulted, follow up [place] discussion - continueous cardiac monitoring, pulse ox - vitals per routine - strict I/O's - contiue warming blankets and monitor temp - low threshold to consult CCM - Contact family to visit given current status - wound care consulted, appreciate recs, wound care per recs, signed off  AMS: One day history of mental status change. Likely 2/2 to sepsis.  Remains obtunded and  unresponsive. - continue to monitor - Family notified for visitation  AKI: worsening Cr 2.01>2.21 (baseline 1.0), BUN 58. Likely prerenal in setting of sepsis and report of hypotension at SNF. - continue aggressive hydration at this time - monitor Cr - hold home BP meds - avoid nephrotoxic agents  Elevated LFT's: improving AST 3718>2289, ALT 2466>2007. Normal LFT's at discharge on 5/3. Expect 2/2 to shock to liver from sepsis. CT abdomen negative for liver abnormality. Viral hepatitis panel pending. INR 2.9 - follow up viral hepatitis panel - consider further imaging if no improvement - Avoid tylenol - continue PPX Lovenox for DVT  LUE DVT:  Noted by ultrasound at SNF 2/2 to edema of left arm which extends up to jugular. Patient currently on DVT prophylaxis given recent GI bleed and patient's refusal of anticoagulation. No signs of respiratory distress to indicate PE at this time, stable on room air. - continue renally dosed Lovenox  - monitor repsiratry status.  - continue to hold full pharmacologic anticoagulation w/ doac  Constipation: acute on chronic Fecal impaction with large stool burden and mild stercoral colitis on CT abdomen. Likely cause of her recent abdominal pain. Home meds: Miralax BID and Senna QD. - holding meds for now given mental status - NPO - lidoderm patch for abdominal pain. Avoiding NSAIDs, tylenol. Can consider morphine as her mental status/vital signs improve  Yeast Infection: - Consider treatmentwith Diflucan once stable  Normocytic Anemia: chronic, stable Hgb 8.5 (baseline 8-9). No signs of bleeding. - continue to montior  HTN:  Hypertensive on admission, despite sepsis. BP this AM 148/61. Home meds: Losartan 50mg  QD - continue to hold home meds - monitor BP  Protein calorie malnutrition - extremely frail.  Albumin 2.5 Nutrition consulted at last admission and recommended Ensure Enlive BID, MTVI - will add back when patient less altered and more  stable  PVD -  Has external iliac to profunda bypass in 2009.  ABI of 0.58 on right and 0.54 on left last September.  - hold ASA  Hx of a-flutter s/p ablation - following with cardiologist w/ yearly checkups.  Ablation in 2014.  Refusing systemic anticoagulation.  Intolerant of beta blockers.  - continuous cardiac monitor  FEN/GI: 65mL/hr, NPO PPx: Renally adjusted DVT ppx Lovenox  Disposition: [facility] once stable  Subjective:  Patient obtunded this AM. Unresponsive to voice.   Objective: Temp:  [92.6 F (33.7 C)-97.1 F (36.2 C)] 95.1 F (35.1 C) (05/20 0320) Pulse Rate:  [49-96] 86 (05/20 0320) Resp:  [10-31] 26 (05/20 0320) BP: (146-187)/(48-134) 148/48 (05/20 0320) SpO2:  [94 %-100 %] 98 % (05/20 0320) Weight:  [40.3 kg] 40.3 kg (05/19 1637) Physical Exam: General: very ill appearing frail elderly lady,  HEENT: normocephalic, atraumatic, PERRL CV:  Mildly tachycardic, regular rhythm without murmurs, no lower extremity edema, 1-2+ radial and pedal pulses bilaterally Lungs: coarse breath sounds, some mild increased work of breathing. On room air Abdomen: soft, decreased bowel sounds Skin: limited given current clinical state Extremities: warm and well perfused Neuro: obtunded, unresponsive during exam  Laboratory: Recent Labs  Lab 11/19/2018 1650 12/10/18 0254  WBC 31.4* 41.1*  HGB 10.5* 8.5*  HCT 33.7* 27.5*  PLT 104* 81*   Recent Labs  Lab 10/25/2018 1650 12/10/18 0254  NA 141 140  K 4.6 4.9  CL 106 110  CO2 16* 11*  BUN 54* 58*  CREATININE 2.01* 2.21*  CALCIUM 8.3* 7.3*  PROT 5.8* 4.5*  BILITOT 1.9* 1.8*  ALKPHOS 188* 177*  ALT 2,466* 2,007*  AST 3,718* 2,289*  GLUCOSE 131* 67*    Lactic Acid: 7.1>7.2>7.7>9.8>11 Lipase 40 Blood culture: pending COVID negative Urine culture: pending Procalcitonin 4.13 Cortisol >100 PT/INR: 29.9/2.9 Hepatitis panel: pending C.Diff pending GI panel: pending  Urinalysis    Component Value Date/Time    COLORURINE AMBER (A) 11/13/2018 2030   APPEARANCEUR CLOUDY (A) 11/22/2018 2030   LABSPEC 1.016 10/25/2018 2030   [place] 5.0 10/25/2018 2030   GLUCOSEU NEGATIVE 11/16/2018 2030   HGBUR MODERATE (A) 11/02/2018 2030   BILIRUBINUR NEGATIVE 11/07/2018 2030   [place] 11/02/2018 2030   PROTEINUR 100 (A) 11/19/2018 2030   UROBILINOGEN 0.2 07/28/2014 0047   NITRITE NEGATIVE 11/08/2018 2030   LEUKOCYTESUR LARGE (A) 11/10/2018 2030    Imaging/Diagnostic Tests: Ct Abdomen Pelvis Wo Contrast  Result Date: 11/17/2018 CLINICAL DATA:  85 y/o F; altered mental status, known DVT in the left jugular 2 axillary, multiple wounds to sacrum, left leg, left heel. Abdominal pain. EXAM: CT ABDOMEN AND PELVIS WITHOUT CONTRAST TECHNIQUE: Multidetector CT imaging of the abdomen and pelvis was performed following the standard protocol without IV contrast. COMPARISON:  11/29/2009 CT abdomen and pelvis. 11/08/2018 chest radiograph. 10/22/2018 abdomen radiographs. FINDINGS: Lower chest: Small right and moderate left pleural effusions with dependent lower lobe opacities, likely associated atelectasis. Cardiomegaly. Coronary artery and aortic calcific atherosclerosis. Hepatobiliary: No focal liver abnormality is seen. No gallstones, gallbladder wall thickening, or biliary dilatation. Pancreas: Unremarkable. No pancreatic ductal dilatation or surrounding inflammatory changes. Spleen: Normal in size without focal abnormality. Adrenals/Urinary Tract: Adrenal glands are unremarkable. Kidney cysts measuring up to 4.3 cm in the upper pole of right kidney. Otherwise kidneys are normal, without renal calculi, focal lesion, or hydronephrosis.  Bladder is collapsed around a Foley catheter. Stomach/Bowel: No obstructive or inflammatory changes of the stomach or small bowel. There is a large volume of stool within the sigmoid colon and rectum. There is wall thickening of the rectum. Vascular/Lymphatic: Aortic atherosclerosis. No  enlarged abdominal or pelvic lymph nodes. Reproductive: Status post hysterectomy. No adnexal masses. Other: No abdominal wall hernia or abnormality. No abdominopelvic ascites. Musculoskeletal: Multiple right anterior chronic rib fractures. Chronic fracture deformity of the right inferior pubic ramus. Partially visualized left femur intramedullary nail and proximal lag screw. Bones are diffusely demineralized. Stable moderate to severe T9 compression deformity from prior chest radiograph. Stable moderate L3 compression deformity from prior abdomen radiographs, new from 2018. There is a persistent visible fracture line indicating probable scratch subacute etiology. IMPRESSION: 1. Fecal impaction with large volume of stool in the sigmoid colon and rectum. Wall thickening of the rectum compatible with mild stercoral colitis. 2. Small right and moderate left pleural effusions. 3. Moderate L3 subacute compression deformity and T9 moderate to severe chronic compression deformities. Electronically Signed   By: Kristine Garbe M.D.   On: 10/26/2018 20:30   Ct Head Wo Contrast  Result Date: 11/20/2018 CLINICAL DATA:  85 year old female with altered mental status. EXAM: CT HEAD WITHOUT CONTRAST TECHNIQUE: Contiguous axial images were obtained from the base of the skull through the vertex without intravenous contrast. COMPARISON:  Head CT 03/12/2018. FINDINGS: Brain: Increased hypodensity in the left thalamus on series 2, image 16 was apparent on the prior study. Elsewhere Stable gray-white matter differentiation throughout the brain. No midline shift, ventriculomegaly, mass effect, evidence of mass lesion, intracranial hemorrhage or evidence of cortically based acute infarction. Vascular: Calcified atherosclerosis at the skull base. No suspicious intracranial vascular hyperdensity. Skull: No acute osseous abnormality identified. Sinuses/Orbits: New right tympanic cavity opacification and mastoid fluid, with a  mastoid fluid level visible on series 3, image 15. The contralateral left tympanic cavity and mastoids remain clear. Negative visible nasopharynx. Paranasal sinuses remain clear. Other: Calcified scalp vessel atherosclerosis. No acute orbit or scalp soft tissue findings. IMPRESSION: 1. No acute intracranial abnormality. Evolved left thalamic lacunar infarcts since 2019. 2. New opacification of the right tympanic cavity and mastoid suggesting otitis media with mastoid effusion. Electronically Signed   By: Genevie Ann M.D.   On: 10/24/2018 20:11   Dg Chest Port 1 View  Result Date: 11/02/2018 CLINICAL DATA:  Altered mental status. EXAM: PORTABLE CHEST 1 VIEW COMPARISON:  10/22/2018 FINDINGS: 1657 hours. Lungs are hyperexpanded. Moderate left pleural effusion associated with small right pleural effusion. The cardio pericardial silhouette is enlarged. Bones are diffusely demineralized. Telemetry leads overlie the chest. IMPRESSION: Bilateral pleural effusions, left greater than right with probable associated basilar atelectasis. Electronically Signed   By: Misty Stanley M.D.   On: 11/05/2018 18:18     Danna Hefty, DO 11-19-18, 6:35 AM PGY-1, [place] Intern pager: [phone], text pages welcome

## 2018-11-24 NOTE — Consult Note (Signed)
[facility] Nurse wound consult note Patient receiving care in [place].  I spoke with her primary RN, Neldon Newport, via telephone in order to complete the consult.  I did not see any photographs of the areas of concern in the EMR. Reason for Consult: sacral ulcer Wound types:  The LLE lateral aspect of the leg has what Neldon Newport describes as a "fresh" skin tear.  No s/s of infection.  For this wound the plan is to place Vaseline gauze Kellie Simmering [phone]) over the area, cover with gauze, secure with kerlex.  Change daily. The left posterior heel has a partial thickness wound consistent with a stage 2 pressure injury.  The area measures 3 cm x 1 cm without measureable depth and is deep red in color.  For this wound the plan of care is prevalon boots to bilateral feet and application of a hydrocolloid Kellie Simmering [phone]) to the wound after cleansing, and to change the dressing daily. The sacral/coccyx area has a wound that is white in the middle with black and red surrounding the central white area.  The entire area measures 8 cm x 6 cm x no measurable depth.  The primary RN explained that the foul odor seems to be coming from the frequent stools the patient is experiencing.  This wound description has some characteristics that are consistent with an evolving DTPI.  The plan of care for this area is coverage with as many hydrocolloid dressings Kellie Simmering [phone]) as necessary to protect the impacted area from further effects of the stooling. This dressing will also support autolytic debridement of the blackened tissue. Pressure Injury POA: Yes Monitor the wound area(s) for worsening of condition such as: Signs/symptoms of infection,  Increase in size,  Development of or worsening of odor, Development of pain, or increased pain at the affected locations.  Notify the medical team if any of these develop.  Thank you for the consult.  Discussed plan of care with the patient and bedside nurse.  [facility] nurse will not follow at this time.  Please  re-consult the [facility] team if needed.  Val Riles, RN, MSN, CWOCN, CNS-BC, pager [phone]

## 2018-11-24 NOTE — Progress Notes (Signed)
Dr.Olson updated with decrease body temp I will apply warming blanket. He was made aware of increased change in lactic acid we will continue to cycle labs frequent. Patient remains obtunded at this time.

## 2018-11-24 NOTE — Death Summary Note (Signed)
[facility] Death Summary  Patient name: Michelle Gaines record number: [id number] Date of birth: [birth year] Age: 85 y.o. Gender: female Date of Admission: 11-25-2018  Date of Death: 2018/11/26 Admitting Physician: Dickie La, MD  Primary Care Provider: Levin Erp, MD Consultants: None  Indication for Hospitalization: Sepsis  Discharge Diagnoses/Problem List:  Sepsis Lactic acidosis AMS AKI Anemia Constipation Elevated transaminases Left upper extremity DVT Yeast infection HTN Protein calorie malnutrition PVD History of a-flutter s/p ablation   Disposition: Death  Brief Hospital Course:  Michelle Gaines is a 85 y.o. female with past medical history significant for recent GI bleed duet o stercoral ulcer, constipation, urinary retention, HTN, PVD, a-flutter, anemia, and malnutrition, who presented with AMS and hypothermia and found to have sepsis with lactic acidosis likely secondary to sacral wound.  Patient received fluids and started on broad spectrum IV antibiotics. Her clinical status continued to deteriorate with an increasing lactic acid despite fluid resuscitation. A goals of care discussion was held with family the morning of 26-Nov-2022 and family opted for full comfort care. Orders were placed and aggressive treatment was discontinued. The patient expired comfortably at 12:50pm with family at bedside.   Significant Procedures: CT head without contrast, CT abdomen/pelvis without contrast  Significant Labs and Imaging:   CBC Latest Ref Rng & Units 11/26/18 11/25/2018 10/27/2018  WBC 4.0 - 10.5 K/uL 41.1(H) 31.4(H) 14.7(H)  Hemoglobin 12.0 - 15.0 g/dL 8.5(L) 10.5(L) 8.3(L)  Hematocrit 36.0 - 46.0 % 27.5(L) 33.7(L) 24.6(L)  Platelets 150 - 400 K/uL 81(L) 104(L) 106(L)    CMP Latest Ref Rng & Units 26-Nov-2018 November 25, 2018 10/28/2018  Glucose 70 - 99 mg/dL 67(L) 131(H) 120(H)  BUN 8 - 23 mg/dL 58(H) 54(H) 27(H)  Creatinine 0.44 - 1.00 mg/dL  2.21(H) 2.01(H) 0.93  Sodium 135 - 145 mmol/L 140 141 138  Potassium 3.5 - 5.1 mmol/L 4.9 4.6 3.6  Chloride 98 - 111 mmol/L 110 106 106  CO2 22 - 32 mmol/L 11(L) 16(L) 25  Calcium 8.9 - 10.3 mg/dL 7.3(L) 8.3(L) 8.4(L)  Total Protein 6.5 - 8.1 g/dL 4.5(L) 5.8(L) -  Total Bilirubin 0.3 - 1.2 mg/dL 1.8(H) 1.9(H) -  Alkaline Phos 38 - 126 U/L 177(H) 188(H) -  AST 15 - 41 U/L 2,289(H) 3,718(H) -  ALT 0 - 44 U/L 2,007(H) 2,466(H) -   Urinalysis    Component Value Date/Time   COLORURINE AMBER (A) 11-25-2018 2030   APPEARANCEUR CLOUDY (A) 25-Nov-2018 2030   LABSPEC 1.016 11/25/2018 2030   [place] 5.0 11-25-18 2030   GLUCOSEU NEGATIVE 11/25/2018 2030   HGBUR MODERATE (A) 2018-11-25 2030   BILIRUBINUR NEGATIVE 11/25/18 2030   [place] 11/25/2018 2030   PROTEINUR 100 (A) 11/25/2018 2030   UROBILINOGEN 0.2 07/28/2014 0047   NITRITE NEGATIVE 11-25-2018 2030   LEUKOCYTESUR LARGE (A) 25-Nov-2018 2030   Lactic Acid: 7.1>7.2>7.7>9.8>11 COVID: negative Lipase: 40 C. Diff: negative PT/INR: 29.9/2.9 AM Cortisol: >100 Procalcitonin: 4.13 Blood culture: pending Urine culture: pending GI panel: pending Hepatitis panel: pending  Ct Abdomen Pelvis Wo Contrast  Result Date: 11-25-2018 CLINICAL DATA:  85 y/o F; altered mental status, known DVT in the left jugular 2 axillary, multiple wounds to sacrum, left leg, left heel. Abdominal pain. EXAM: CT ABDOMEN AND PELVIS WITHOUT CONTRAST TECHNIQUE: Multidetector CT imaging of the abdomen and pelvis was performed following the standard protocol without IV contrast. COMPARISON:  11/29/2009 CT abdomen and pelvis. 11/25/18 chest radiograph. 10/22/2018 abdomen radiographs. FINDINGS: Lower chest: Small right  and moderate left pleural effusions with dependent lower lobe opacities, likely associated atelectasis. Cardiomegaly. Coronary artery and aortic calcific atherosclerosis. Hepatobiliary: No focal liver abnormality is seen. No gallstones,  gallbladder wall thickening, or biliary dilatation. Pancreas: Unremarkable. No pancreatic ductal dilatation or surrounding inflammatory changes. Spleen: Normal in size without focal abnormality. Adrenals/Urinary Tract: Adrenal glands are unremarkable. Kidney cysts measuring up to 4.3 cm in the upper pole of right kidney. Otherwise kidneys are normal, without renal calculi, focal lesion, or hydronephrosis. Bladder is collapsed around a Foley catheter. Stomach/Bowel: No obstructive or inflammatory changes of the stomach or small bowel. There is a large volume of stool within the sigmoid colon and rectum. There is wall thickening of the rectum. Vascular/Lymphatic: Aortic atherosclerosis. No enlarged abdominal or pelvic lymph nodes. Reproductive: Status post hysterectomy. No adnexal masses. Other: No abdominal wall hernia or abnormality. No abdominopelvic ascites. Musculoskeletal: Multiple right anterior chronic rib fractures. Chronic fracture deformity of the right inferior pubic ramus. Partially visualized left femur intramedullary nail and proximal lag screw. Bones are diffusely demineralized. Stable moderate to severe T9 compression deformity from prior chest radiograph. Stable moderate L3 compression deformity from prior abdomen radiographs, new from 2018. There is a persistent visible fracture line indicating probable scratch subacute etiology. IMPRESSION: 1. Fecal impaction with large volume of stool in the sigmoid colon and rectum. Wall thickening of the rectum compatible with mild stercoral colitis. 2. Small right and moderate left pleural effusions. 3. Moderate L3 subacute compression deformity and T9 moderate to severe chronic compression deformities. Electronically Signed   By: Kristine Garbe M.D.   On: 11/03/2018 20:30   Ct Head Wo Contrast  Result Date: 11/07/2018 CLINICAL DATA:  85 year old female with altered mental status. EXAM: CT HEAD WITHOUT CONTRAST TECHNIQUE: Contiguous axial images  were obtained from the base of the skull through the vertex without intravenous contrast. COMPARISON:  Head CT 03/12/2018. FINDINGS: Brain: Increased hypodensity in the left thalamus on series 2, image 16 was apparent on the prior study. Elsewhere Stable gray-white matter differentiation throughout the brain. No midline shift, ventriculomegaly, mass effect, evidence of mass lesion, intracranial hemorrhage or evidence of cortically based acute infarction. Vascular: Calcified atherosclerosis at the skull base. No suspicious intracranial vascular hyperdensity. Skull: No acute osseous abnormality identified. Sinuses/Orbits: New right tympanic cavity opacification and mastoid fluid, with a mastoid fluid level visible on series 3, image 15. The contralateral left tympanic cavity and mastoids remain clear. Negative visible nasopharynx. Paranasal sinuses remain clear. Other: Calcified scalp vessel atherosclerosis. No acute orbit or scalp soft tissue findings. IMPRESSION: 1. No acute intracranial abnormality. Evolved left thalamic lacunar infarcts since 2019. 2. New opacification of the right tympanic cavity and mastoid suggesting otitis media with mastoid effusion. Electronically Signed   By: Genevie Ann M.D.   On: 11/07/2018 20:11   Dg Chest Port 1 View  Result Date: 11/08/2018 CLINICAL DATA:  Altered mental status. EXAM: PORTABLE CHEST 1 VIEW COMPARISON:  10/22/2018 FINDINGS: 1657 hours. Lungs are hyperexpanded. Moderate left pleural effusion associated with small right pleural effusion. The cardio pericardial silhouette is enlarged. Bones are diffusely demineralized. Telemetry leads overlie the chest. IMPRESSION: Bilateral pleural effusions, left greater than right with probable associated basilar atelectasis. Electronically Signed   By: Misty Stanley M.D.   On: 11/20/2018 18:18    Danna Hefty, DO Nov 15, 2018, 1:18 PM PGY-1, [place]

## 2018-11-24 NOTE — Progress Notes (Signed)
FPTS Interim Progress Note  Got a page stating that family had arrived. Dr. Andria Frames and I went to speak to family concerning [place] given her current clinical status. After a lengthy discussion with family (son, daughter, and significant others) they have opted for full comfort care. Orders have been placed.    Mina Marble [place], DO 04-Dec-2018, 10:28 AM PGY-1, [place] Medicine Service pager [phone]

## 2018-11-24 DEATH — deceased

## 2019-01-28 ENCOUNTER — Ambulatory Visit: Payer: Medicare Other | Admitting: Internal Medicine
